# Patient Record
Sex: Male | Born: 1940
Health system: Southern US, Community
[De-identification: ages and names within clinical notes are randomized; demographics above are authoritative.]

## PROBLEM LIST (undated history)

## (undated) DIAGNOSIS — M549 Dorsalgia, unspecified: Secondary | ICD-10-CM

## (undated) DIAGNOSIS — E785 Hyperlipidemia, unspecified: Secondary | ICD-10-CM

## (undated) DIAGNOSIS — I10 Essential (primary) hypertension: Secondary | ICD-10-CM

## (undated) DIAGNOSIS — F09 Unspecified mental disorder due to known physiological condition: Secondary | ICD-10-CM

## (undated) DIAGNOSIS — G44329 Chronic post-traumatic headache, not intractable: Secondary | ICD-10-CM

## (undated) DIAGNOSIS — K219 Gastro-esophageal reflux disease without esophagitis: Secondary | ICD-10-CM

## (undated) DIAGNOSIS — S0990XS Unspecified injury of head, sequela: Secondary | ICD-10-CM

## (undated) DIAGNOSIS — G8929 Other chronic pain: Secondary | ICD-10-CM

## (undated) DIAGNOSIS — N529 Male erectile dysfunction, unspecified: Secondary | ICD-10-CM

## (undated) DIAGNOSIS — K5792 Diverticulitis of intestine, part unspecified, without perforation or abscess without bleeding: Secondary | ICD-10-CM

## (undated) DIAGNOSIS — R739 Hyperglycemia, unspecified: Secondary | ICD-10-CM

## (undated) HISTORY — DX: Diverticulitis of intestine, part unspecified, without perforation or abscess without bleeding: K57.92

## (undated) HISTORY — PX: BACK SURGERY: SHX140

## (undated) HISTORY — DX: Chronic post-traumatic headache, not intractable: G44.329

## (undated) HISTORY — DX: Unspecified injury of head, sequela: S09.90XS

## (undated) HISTORY — DX: Hyperglycemia, unspecified: R73.9

## (undated) HISTORY — DX: Unspecified mental disorder due to known physiological condition: F09

## (undated) HISTORY — DX: Hyperlipidemia, unspecified: E78.5

## (undated) HISTORY — PX: CIRCUMCISION: SUR203

## (undated) HISTORY — DX: Male erectile dysfunction, unspecified: N52.9

## (undated) HISTORY — DX: Essential (primary) hypertension: I10

## (undated) HISTORY — DX: Gastro-esophageal reflux disease without esophagitis: K21.9

## (undated) HISTORY — DX: Other chronic pain: G89.29

## (undated) HISTORY — DX: Dorsalgia, unspecified: M54.9

---

## 1990-12-27 DIAGNOSIS — K5792 Diverticulitis of intestine, part unspecified, without perforation or abscess without bleeding: Secondary | ICD-10-CM

## 1990-12-27 HISTORY — DX: Diverticulitis of intestine, part unspecified, without perforation or abscess without bleeding: K57.92

## 1997-12-27 HISTORY — PX: NASAL SEPTUM SURGERY: SHX37

## 1997-12-27 HISTORY — PX: SHOULDER SURGERY: SHX246

## 1998-07-17 ENCOUNTER — Ambulatory Visit (HOSPITAL_BASED_OUTPATIENT_CLINIC_OR_DEPARTMENT_OTHER): Admission: RE | Admit: 1998-07-17 | Discharge: 1998-07-17 | Payer: Self-pay | Admitting: Orthopedic Surgery

## 2001-03-22 ENCOUNTER — Encounter: Payer: Self-pay | Admitting: Family Medicine

## 2006-05-31 ENCOUNTER — Ambulatory Visit: Payer: Self-pay | Admitting: Family Medicine

## 2006-09-22 ENCOUNTER — Ambulatory Visit: Payer: Self-pay | Admitting: Internal Medicine

## 2007-07-05 ENCOUNTER — Ambulatory Visit: Payer: Self-pay | Admitting: Internal Medicine

## 2007-07-18 ENCOUNTER — Telehealth (INDEPENDENT_AMBULATORY_CARE_PROVIDER_SITE_OTHER): Payer: Self-pay | Admitting: *Deleted

## 2008-01-31 ENCOUNTER — Encounter: Payer: Self-pay | Admitting: Family Medicine

## 2008-01-31 DIAGNOSIS — H9319 Tinnitus, unspecified ear: Secondary | ICD-10-CM | POA: Insufficient documentation

## 2008-01-31 DIAGNOSIS — E7800 Pure hypercholesterolemia, unspecified: Secondary | ICD-10-CM | POA: Insufficient documentation

## 2008-01-31 DIAGNOSIS — F528 Other sexual dysfunction not due to a substance or known physiological condition: Secondary | ICD-10-CM | POA: Insufficient documentation

## 2008-01-31 DIAGNOSIS — K449 Diaphragmatic hernia without obstruction or gangrene: Secondary | ICD-10-CM | POA: Insufficient documentation

## 2008-01-31 DIAGNOSIS — S0990XA Unspecified injury of head, initial encounter: Secondary | ICD-10-CM | POA: Insufficient documentation

## 2008-02-01 ENCOUNTER — Encounter: Admission: RE | Admit: 2008-02-01 | Discharge: 2008-02-01 | Payer: Self-pay | Admitting: Family Medicine

## 2008-02-01 ENCOUNTER — Ambulatory Visit: Payer: Self-pay | Admitting: Family Medicine

## 2008-02-01 DIAGNOSIS — I10 Essential (primary) hypertension: Secondary | ICD-10-CM | POA: Insufficient documentation

## 2008-02-01 DIAGNOSIS — M549 Dorsalgia, unspecified: Secondary | ICD-10-CM

## 2008-02-01 DIAGNOSIS — G8929 Other chronic pain: Secondary | ICD-10-CM

## 2008-02-05 LAB — CONVERTED CEMR LAB
ALT: 24 units/L (ref 0–53)
AST: 25 units/L (ref 0–37)
Albumin: 4.1 g/dL (ref 3.5–5.2)
BUN: 12 mg/dL (ref 6–23)
Basophils Absolute: 0 10*3/uL (ref 0.0–0.1)
Calcium: 9.8 mg/dL (ref 8.4–10.5)
Eosinophils Absolute: 0.1 10*3/uL (ref 0.0–0.6)
Eosinophils Relative: 1.3 % (ref 0.0–5.0)
GFR calc Af Amer: 86 mL/min
HDL: 23.9 mg/dL — ABNORMAL LOW (ref >39.0)
MCHC: 32.3 g/dL (ref 30.0–36.0)
MCV: 89.2 fL (ref 78.0–100.0)
Platelets: 291 10*3/uL (ref 150–400)
Potassium: 4.3 meq/L (ref 3.5–5.1)
RBC: 4.95 M/uL (ref 4.22–5.81)
Triglycerides: 221 mg/dL (ref 0–149)
WBC: 9.8 10*3/uL (ref 4.5–10.5)

## 2008-02-29 ENCOUNTER — Ambulatory Visit: Payer: Self-pay | Admitting: Family Medicine

## 2008-05-03 ENCOUNTER — Telehealth: Payer: Self-pay | Admitting: Family Medicine

## 2008-06-18 ENCOUNTER — Telehealth: Payer: Self-pay | Admitting: Family Medicine

## 2008-07-11 ENCOUNTER — Ambulatory Visit: Payer: Self-pay | Admitting: Family Medicine

## 2008-07-18 ENCOUNTER — Ambulatory Visit: Payer: Self-pay | Admitting: Family Medicine

## 2008-07-19 LAB — CONVERTED CEMR LAB
Albumin: 3.9 g/dL (ref 3.5–5.2)
Alkaline Phosphatase: 88 units/L (ref 39–117)
BUN: 14 mg/dL (ref 6–23)
Bilirubin, Direct: 0.1 mg/dL (ref 0.0–0.3)
CO2: 31 meq/L (ref 19–32)
Chloride: 104 meq/L (ref 96–112)
Creatinine, Ser: 1.3 mg/dL (ref 0.4–1.5)
GFR calc non Af Amer: 59 mL/min
LDL Cholesterol: 113 mg/dL — ABNORMAL HIGH (ref 0–99)
Potassium: 3.5 meq/L (ref 3.5–5.1)
Total CHOL/HDL Ratio: 7.2

## 2008-08-01 ENCOUNTER — Telehealth (INDEPENDENT_AMBULATORY_CARE_PROVIDER_SITE_OTHER): Payer: Self-pay | Admitting: *Deleted

## 2008-08-28 ENCOUNTER — Telehealth: Payer: Self-pay | Admitting: Family Medicine

## 2008-10-08 ENCOUNTER — Ambulatory Visit: Payer: Self-pay | Admitting: Family Medicine

## 2008-10-08 DIAGNOSIS — E119 Type 2 diabetes mellitus without complications: Secondary | ICD-10-CM | POA: Insufficient documentation

## 2008-10-10 LAB — CONVERTED CEMR LAB
Hgb A1c MFr Bld: 6.5 % — ABNORMAL HIGH (ref 4.6–6.0)
PSA: 0.79 ng/mL (ref 0.10–4.00)

## 2008-10-23 ENCOUNTER — Ambulatory Visit: Payer: Self-pay | Admitting: Family Medicine

## 2008-10-24 LAB — CONVERTED CEMR LAB: OCCULT 1: NEGATIVE

## 2009-01-17 ENCOUNTER — Ambulatory Visit: Payer: Self-pay | Admitting: Family Medicine

## 2009-01-20 LAB — CONVERTED CEMR LAB: Hgb A1c MFr Bld: 6.5 % — ABNORMAL HIGH (ref 4.6–6.0)

## 2009-01-22 ENCOUNTER — Ambulatory Visit: Payer: Self-pay | Admitting: Family Medicine

## 2009-01-29 ENCOUNTER — Telehealth: Payer: Self-pay | Admitting: Family Medicine

## 2009-03-10 ENCOUNTER — Ambulatory Visit: Payer: Self-pay | Admitting: Family Medicine

## 2009-05-13 ENCOUNTER — Ambulatory Visit: Payer: Self-pay | Admitting: Family Medicine

## 2009-05-15 LAB — CONVERTED CEMR LAB
ALT: 11 units/L (ref 0–53)
AST: 19 units/L (ref 0–37)
Albumin: 3.6 g/dL (ref 3.5–5.2)
BUN: 12 mg/dL (ref 6–23)
Chloride: 110 meq/L (ref 96–112)
Creatinine, Ser: 1 mg/dL (ref 0.4–1.5)
Glucose, Bld: 98 mg/dL (ref 70–99)
Hgb A1c MFr Bld: 6.3 % (ref 4.6–6.5)
Phosphorus: 2.8 mg/dL (ref 2.3–4.6)
Total CHOL/HDL Ratio: 6
VLDL: 25.6 mg/dL (ref 0.0–40.0)

## 2009-05-19 ENCOUNTER — Telehealth: Payer: Self-pay | Admitting: Family Medicine

## 2009-07-04 ENCOUNTER — Telehealth: Payer: Self-pay | Admitting: Family Medicine

## 2009-08-11 ENCOUNTER — Ambulatory Visit: Payer: Self-pay | Admitting: Family Medicine

## 2009-08-12 LAB — CONVERTED CEMR LAB
Albumin: 4 g/dL (ref 3.5–5.2)
BUN: 15 mg/dL (ref 6–23)
Chloride: 105 meq/L (ref 96–112)
Creatinine, Ser: 1.1 mg/dL (ref 0.4–1.5)
Glucose, Bld: 112 mg/dL — ABNORMAL HIGH (ref 70–99)
Hgb A1c MFr Bld: 6.1 % (ref 4.6–6.5)
Phosphorus: 4 mg/dL (ref 2.3–4.6)

## 2009-11-10 ENCOUNTER — Ambulatory Visit: Payer: Self-pay | Admitting: Family Medicine

## 2009-11-11 ENCOUNTER — Encounter: Payer: Self-pay | Admitting: Family Medicine

## 2009-11-11 LAB — CONVERTED CEMR LAB
ALT: 15 units/L (ref 0–53)
AST: 16 units/L (ref 0–37)
Chloride: 105 meq/L (ref 96–112)
GFR calc non Af Amer: 70.79 mL/min (ref >60)
Glucose, Bld: 98 mg/dL (ref 70–99)
Phosphorus: 3.4 mg/dL (ref 2.3–4.6)
Potassium: 3.9 meq/L (ref 3.5–5.1)
Sodium: 142 meq/L (ref 135–145)
Total CHOL/HDL Ratio: 6

## 2009-11-18 ENCOUNTER — Ambulatory Visit: Payer: Self-pay | Admitting: Family Medicine

## 2009-11-19 ENCOUNTER — Telehealth: Payer: Self-pay | Admitting: Family Medicine

## 2009-11-19 ENCOUNTER — Encounter: Payer: Self-pay | Admitting: Family Medicine

## 2009-12-08 ENCOUNTER — Telehealth: Payer: Self-pay | Admitting: Family Medicine

## 2009-12-09 LAB — HM DIABETES EYE EXAM: HM Diabetic Eye Exam: NORMAL

## 2010-02-09 ENCOUNTER — Ambulatory Visit: Payer: Self-pay | Admitting: Family Medicine

## 2010-02-13 LAB — CONVERTED CEMR LAB
ALT: 18 units/L (ref 0–53)
BUN: 15 mg/dL (ref 6–23)
CO2: 32 meq/L (ref 19–32)
Calcium: 9.8 mg/dL (ref 8.4–10.5)
Chloride: 108 meq/L (ref 96–112)
Cholesterol: 152 mg/dL (ref 0–200)
Creatinine, Ser: 1.2 mg/dL (ref 0.4–1.5)
Direct LDL: 93.1 mg/dL
GFR calc non Af Amer: 63.98 mL/min (ref >60)
Hgb A1c MFr Bld: 6.3 % (ref 4.6–6.5)
Sodium: 144 meq/L (ref 135–145)
Total CHOL/HDL Ratio: 5
Triglycerides: 326 mg/dL — ABNORMAL HIGH (ref 0.0–149.0)

## 2010-03-03 ENCOUNTER — Telehealth: Payer: Self-pay | Admitting: Family Medicine

## 2010-06-01 ENCOUNTER — Telehealth: Payer: Self-pay | Admitting: Internal Medicine

## 2010-07-01 ENCOUNTER — Telehealth: Payer: Self-pay | Admitting: Family Medicine

## 2010-08-10 ENCOUNTER — Ambulatory Visit: Payer: Self-pay | Admitting: Family Medicine

## 2010-08-10 ENCOUNTER — Encounter (INDEPENDENT_AMBULATORY_CARE_PROVIDER_SITE_OTHER): Payer: Self-pay | Admitting: *Deleted

## 2010-08-13 LAB — CONVERTED CEMR LAB
AST: 20 units/L (ref 0–37)
Albumin: 4.1 g/dL (ref 3.5–5.2)
BUN: 15 mg/dL (ref 6–23)
Calcium: 9.2 mg/dL (ref 8.4–10.5)
Chloride: 102 meq/L (ref 96–112)
Cholesterol: 156 mg/dL (ref 0–200)
Direct LDL: 93.5 mg/dL
Hgb A1c MFr Bld: 6.4 % (ref 4.6–6.5)
Potassium: 4.7 meq/L (ref 3.5–5.1)
VLDL: 75.4 mg/dL — ABNORMAL HIGH (ref 0.0–40.0)

## 2010-09-04 ENCOUNTER — Encounter (INDEPENDENT_AMBULATORY_CARE_PROVIDER_SITE_OTHER): Payer: Self-pay | Admitting: *Deleted

## 2010-09-07 ENCOUNTER — Ambulatory Visit: Payer: Self-pay | Admitting: Gastroenterology

## 2010-09-21 ENCOUNTER — Ambulatory Visit: Payer: Self-pay | Admitting: Gastroenterology

## 2010-09-21 LAB — HM COLONOSCOPY

## 2010-10-08 ENCOUNTER — Telehealth: Payer: Self-pay | Admitting: Family Medicine

## 2010-10-26 ENCOUNTER — Telehealth: Payer: Self-pay | Admitting: Family Medicine

## 2010-12-22 ENCOUNTER — Telehealth: Payer: Self-pay | Admitting: Family Medicine

## 2011-01-26 NOTE — Assessment & Plan Note (Signed)
Summary: 6 MONTH FOLLOW UP/RBH   Vital Signs:  Patient profile:   70 year old male Height:      72.25 inches Weight:      178.75 pounds BMI:     24.16 Temp:     98.2 degrees F oral Pulse rate:   80 / minute Pulse rhythm:   regular BP sitting:   116 / 70  (left arm) Cuff size:   regular  Vitals Entered By: Lewanda Rife LPN (February 09, 2010 3:24 PM)  History of Present Illness: here to follow up with HTN and lipids and DM  is doing well with nothing new  wt is stable- is controlling thiswell (back feels so much better  he thought he had lost some wt - feels thinner   had teeth pulled and implants put in - very happy with that    bp today 116.70- has been in good control with no problems    Dm- last AIC 6.2 with diet control doing well with his low sugar diet  exercise -- is walking 2 miles per day / working less on cars than he used to   got his flu shot  is up to date pneumovax   Allergies: 1)  ! * Aleve 2)  ! Asa 3)  Codeine Sulfate (Codeine Sulfate)  Past History:  Past Medical History: Last updated: 01/22/2009 past hx of head trauma- with permanent cognitive deficits chronic headache from above  HTN chronic back pain with past sx ED hyperlipidemia chronic tinnitus  GERD with HH  episode of memory loss in 09-- was ref to neurol/ pt cancelled due to resolution of symptoms DM 2 - mild   Past Surgical History: Last updated: 02/01/2008 Back surgery x 6-including fusion Diverticulitis (1992) Shoulder surgery (1999) Sinus surgery- deviated septum (1999) Circumcision  Family History: Last updated: 2008/10/31 Father: died in his 1's of unknown causes Mother: died in her 86's Siblings: 33- all older, 1 brother and 1 sister died of unknown causes no DM in family  no cancer in family   Social History: Last updated: 2008/10/31 Marital Status: Married Children: 1 daughter 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 CV:  Denies chest pain or discomfort, lightheadness, palpitations, and shortness of breath with exertion. Resp:  Denies cough and wheezing. GI:  Denies abdominal pain, bloody stools, change in bowel habits, and indigestion. MS:  Complains of joint pain, low back pain, and mid back pain; denies muscle aches and cramps. Derm:  Denies lesion(s), poor wound healing, and rash. Neuro:  Complains of memory loss; denies numbness and tingling. Psych:  Denies anxiety and depression. Endo:  Denies cold intolerance, excessive thirst, excessive urination, and heat intolerance.  Physical Exam  General:  Well-developed,well-nourished,in no acute distress; alert,appropriate and cooperative throughout examination Head:  normocephalic, atraumatic, and no abnormalities observed.   Eyes:  vision grossly intact, pupils equal, pupils round, and pupils reactive to light.  no conjunctival pallor, injection or icterus  Mouth:  pharynx pink and moist.   Neck:  supple with full rom and no masses or thyromegally, no JVD or carotid bruit  Chest Wall:  No deformities, masses, tenderness or gynecomastia noted. Lungs:  Normal respiratory effort, chest expands symmetrically. Lungs are clear to auscultation, no crackles or wheezes. Heart:  Normal rate and regular rhythm. S1  and S2 normal without gallop, murmur, click, rub or other extra sounds. Abdomen:  Bowel sounds positive,abdomen soft and non-tender without masses, organomegaly or hernias noted. no renal bruits  Msk:  No deformity or scoliosis noted of thoracic or lumbar spine.  no acute joint changes poor rom spine Pulses:  R and L carotid,radial,femoral,dorsalis pedis and posterior tibial pulses are full and equal bilaterally Extremities:  No clubbing, cyanosis, edema, or deformity noted with normal full range of motion of all  joints.   Neurologic:  sensation intact to light touch, gait normal, and DTRs symmetrical and normal.   Skin:  Intact without suspicious lesions or rashes Cervical Nodes:  No lymphadenopathy noted Inguinal Nodes:  No significant adenopathy Psych:  cheerful and mentally sharp today  Diabetes Management Exam:    Foot Exam (with socks and/or shoes not present):       Sensory-Pinprick/Light touch:          Left medial foot (L-4): normal          Left dorsal foot (L-5): normal          Left lateral foot (S-1): normal          Right medial foot (L-4): normal          Right dorsal foot (L-5): normal          Right lateral foot (S-1): normal       Sensory-Monofilament:          Left foot: normal          Right foot: normal       Inspection:          Left foot: normal          Right foot: normal       Nails:          Left foot: normal          Right foot: thickened    Eye Exam:       Eye Exam done elsewhere          Date: 12/09/2009          Results: normal          Done by: Dr Nelle Don    Impression & Recommendations:  Problem # 1:  ESSENTIAL HYPERTENSION (ICD-401.9) Assessment Unchanged  good control with low dose lisinopril lab today f/u 6 mo  His updated medication list for this problem includes:    Lisinopril 5 Mg Tabs (Lisinopril) .Marland Kitchen... 1 by mouth once daily in am  Orders: Venipuncture (11914) TLB-Lipid Panel (80061-LIPID) TLB-Renal Function Panel (80069-RENAL) TLB-ALT (SGPT) (84460-ALT) TLB-AST (SGOT) (84450-SGOT) TLB-A1C / Hgb A1C (Glycohemoglobin) (83036-A1C)  BP today: 116/70 Prior BP: 120/60 (11/18/2009)  Labs Reviewed: K+: 3.9 (11/10/2009) Creat: : 1.1 (11/10/2009)   Chol: 153 (11/10/2009)   HDL: 25.60 (11/10/2009)   LDL: 104 (11/10/2009)   TG: 115.0 (11/10/2009)  Problem # 2:  DIABETES MELLITUS, MILD (ICD-250.00) Assessment: Unchanged  doing well with diet control  commended on good diet and exercise and wt control  no asa due to allergy  opthy up  to date lab today f/u 6 mo  His updated medication list for this problem includes:    Lisinopril 5 Mg Tabs (Lisinopril) .Marland Kitchen... 1 by mouth once daily in am  Orders: Venipuncture (78295) TLB-Lipid Panel (80061-LIPID) TLB-Renal Function Panel (80069-RENAL) TLB-ALT (SGPT) (84460-ALT) TLB-AST (SGOT) (84450-SGOT) TLB-A1C / Hgb A1C (Glycohemoglobin) (83036-A1C)  Labs Reviewed: Creat: 1.1 (11/10/2009)     Last Eye Exam: normal (  12/09/2009) Reviewed HgBA1c results: 6.2 (11/10/2009)  6.1 (08/11/2009)  Problem # 3:  HYPERCHOLESTEROLEMIA (ICD-272.0) Assessment: Unchanged goal LDL is 70 or below- not quite there yet  rev low sat fat diet- exp improvement today may need to consider statin f/u  91mo  Orders: Venipuncture (78938) TLB-Lipid Panel (80061-LIPID) TLB-Renal Function Panel (80069-RENAL) TLB-ALT (SGPT) (84460-ALT) TLB-AST (SGOT) (84450-SGOT) TLB-A1C / Hgb A1C (Glycohemoglobin) (83036-A1C) Prescription Created Electronically [phone])  Labs Reviewed: SGOT: 16 (11/10/2009)   SGPT: 15 (11/10/2009)   HDL:25.60 (11/10/2009), 23.50 (05/13/2009)  LDL:104 (11/10/2009), 102 (05/13/2009)  Chol:153 (11/10/2009), 151 (05/13/2009)  Trig:115.0 (11/10/2009), 128.0 (05/13/2009)  Complete Medication List: 1)  Flexeril 10 Mg Tabs (Cyclobenzaprine hcl) .Marland Kitchen.. 1 by mouth two times a day as needed as needed back pain 2)  Viagra 100 Mg Tabs (Sildenafil citrate) .Marland Kitchen.. 1 by mouth once daily as needed 30 minutes before sexual activity 3)  Lisinopril 5 Mg Tabs (Lisinopril) .Marland Kitchen.. 1 by mouth once daily in am  Patient Instructions: 1)  labs today  2)  keep working on healthy diet and exercise  3)  no change in medicines  4)  I sent your blood pressure px to your pharmacy 5)  schedule fasting labs in 6 months and then follow up  6)  lipid/ast/alt/renal / AIC / microlb 272. 250.0. 401.1 Prescriptions: LISINOPRIL 5 MG TABS (LISINOPRIL) 1 by mouth once daily in am  #90 x 3   Entered and Authorized by:   Judith Part MD   Signed by:   Judith Part MD on 02/09/2010   Method used:   Electronically to        [facility] [phone]* (retail)       [address]       [place], [place]  [address]       Ph: [phone]       Fax: [phone]   RxID:   [id number]   Current Allergies (reviewed today): ! * ALEVE ! ASA CODEINE SULFATE (CODEINE SULFATE)

## 2011-01-26 NOTE — Assessment & Plan Note (Signed)
Summary: FOLLOW UP / LFW   Vital Signs:  Patient profile:   70 year old male Weight:      187.75 pounds BMI:     25.38 Temp:     98.0 degrees F oral Pulse rate:   76 / minute Pulse rhythm:   regular BP sitting:   110 / 72  (left arm) Cuff size:   large  Vitals Entered By: Sydell Axon LPN (August 10, 2010 2:48 PM) CC: 6 Month follow-up   History of Present Illness: here for f/u of HTN and lipids and mild DM  has been feeling pretty good  nothing new had shoulder surgery -- arthritis spur - a big surgery/ worse than he thought this is much better   wt is up 9 lb is eating too much -- (can tell because it worsens his heartburn) activity was lmited for a while    bp great at 110/72 on lisinopril alone  DM mild and diet controlled last AIC 6.3 is watching diet for sugar - more veg this summers   lipids due  Last Lipid ProfileCholesterol: 152 (02/09/2010 3:58:10 PM)HDL:  29.30 (02/09/2010 3:58:10 PM)LDL:  104 (11/10/2009 9:34:04 AM)Triglycerides:  Last Liver profileSGOT:  19 (02/09/2010 3:58:10 PM)SPGT:  18 (02/09/2010 3:58:10 PM)T. Bili:  0.6 (07/18/2008 10:09:00 AM)Alk Phos:  88 (07/18/2008 10:09:00 AM)   is interested in colonoscopy for screening  has had some friends with colon cancer- made him think about     Allergies: 1)  ! * Aleve 2)  ! Asa 3)  Codeine Sulfate (Codeine Sulfate)  Past History:  Past Medical History: Last updated: 01/22/2009 past hx of head trauma- with permanent cognitive deficits chronic headache from above  HTN chronic back pain with past sx ED hyperlipidemia chronic tinnitus  GERD with HH  episode of memory loss in 09-- was ref to neurol/ pt cancelled due to resolution of symptoms DM 2 - mild   Family History: Last updated: Oct 16, 2008 Father: died in his 61's of unknown causes Mother: died in her 46's Siblings: 23- all older, 1 brother and 1 sister died of unknown causes no DM in family  no cancer in family   Social  History: Last updated: 10/16/08 Marital Status: Married Children: 1 daughter 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 Eyes:  Denies blurring and eye irritation. CV:  Denies chest pain or discomfort, lightheadness, palpitations, and shortness of breath with exertion. Resp:  Denies cough, shortness of breath, and wheezing. GI:  Denies abdominal pain, change in bowel habits, and indigestion. GU:  Complains of erectile dysfunction; denies urinary frequency. MS:  Complains of joint pain; denies joint redness, joint swelling, and cramps. Derm:  Denies itching, lesion(s), poor wound healing, and rash. Neuro:  Denies numbness and tingling. Psych:  Denies anxiety and depression. Endo:  Denies cold intolerance, excessive thirst, excessive urination, and heat intolerance. Heme:  Denies abnormal bruising and bleeding.  Physical Exam  General:  Well-developed,well-nourished,in no acute distress; alert,appropriate and cooperative throughout examination (wt gain noted) Head:  normocephalic, atraumatic, and no abnormalities observed.   Eyes:  vision grossly intact, pupils equal, pupils round, and pupils reactive to light.  Mouth:  pharynx pink and moist.   Neck:  supple with full rom and no masses or thyromegally, no JVD or carotid bruit  Chest Wall:  No deformities, masses, tenderness or gynecomastia noted. Lungs:  Normal respiratory effort, chest expands symmetrically. Lungs are clear to auscultation, no crackles or wheezes. Heart:  Normal rate and regular rhythm. S1 and S2 normal without gallop, murmur, click, rub  or other extra sounds. Abdomen:  Bowel sounds positive,abdomen soft and non-tender without masses, organomegaly or hernias noted. no renal bruits  Msk:  better rom R shoulder  Pulses:  R and L carotid,radial,femoral,dorsalis pedis and posterior tibial pulses are full and equal bilaterally Extremities:  No clubbing, cyanosis, edema, or deformity noted with normal full range of motion of all joints.   Neurologic:  sensation intact to light touch, gait normal, and DTRs symmetrical and normal.   Skin:  Intact without suspicious lesions or rashes Cervical Nodes:  No lymphadenopathy noted Psych:  normal affect, talkative and pleasant    Diabetes Management Exam:    Foot Exam (with socks and/or shoes not present):       Sensory-Pinprick/Light touch:          Left medial foot (L-4): normal          Left dorsal foot (L-5): normal          Left lateral foot (S-1): normal          Right medial foot (L-4): normal          Right dorsal foot (L-5): normal          Right lateral foot (S-1): normal       Sensory-Monofilament:          Left foot: normal          Right foot: normal       Inspection:          Left foot: normal          Right foot: normal       Nails:          Left foot: normal          Right foot: thickened   Impression & Recommendations:  Problem # 1:  DIABETES MELLITUS, MILD (ICD-250.00) Assessment Unchanged  check AIC today- suspect higher in light of wt gain  rev low sugar diet and need for exercise as tolerated  disc healthy diet (low simple sugar/ choose complex carbs/ low sat fat) diet and exercise in detail   His updated medication list for this problem includes:    Lisinopril 5 Mg Tabs (Lisinopril) .Marland Kitchen... 1 by mouth once daily in am  Orders: Venipuncture (60454) TLB-Lipid Panel (80061-LIPID) TLB-Renal Function Panel (80069-RENAL) TLB-ALT (SGPT) (84460-ALT) TLB-AST (SGOT) (84450-SGOT) TLB-A1C / Hgb A1C (Glycohemoglobin) (83036-A1C)  Labs Reviewed: Creat: 1.2  (02/09/2010)     Last Eye Exam: normal (12/09/2009) Reviewed HgBA1c results: 6.3 (02/09/2010)  6.2 (11/10/2009)  Problem # 2:  ESSENTIAL HYPERTENSION (ICD-401.9) Assessment: Unchanged  bp is in very good control with lisinopril - will continue that lab today f/u 6 mo  His updated medication list for this problem includes:    Lisinopril 5 Mg Tabs (Lisinopril) .Marland Kitchen... 1 by mouth once daily in am  Orders: Venipuncture (09811) TLB-Lipid Panel (80061-LIPID) TLB-Renal Function Panel (80069-RENAL) TLB-ALT (SGPT) (84460-ALT) TLB-AST (SGOT) (84450-SGOT) TLB-A1C / Hgb A1C (Glycohemoglobin) (83036-A1C)  BP today: 110/72 Prior BP: 116/70 (02/09/2010)  Labs Reviewed: K+: 4.9 (02/09/2010) Creat: : 1.2 (02/09/2010)   Chol: 152 (  02/09/2010)   HDL: 29.30 (02/09/2010)   LDL: 104 (11/10/2009)   TG: 326.0 (02/09/2010)  Problem # 3:  HYPERCHOLESTEROLEMIA (ICD-272.0) Assessment: Unchanged  labs today -- with poss increase due to worse eating rev low sat fat diet in detail  f/u 6 mo  lab today Orders: Venipuncture (04540) TLB-Lipid Panel (80061-LIPID) TLB-Renal Function Panel (80069-RENAL) TLB-ALT (SGPT) (84460-ALT) TLB-AST (SGOT) (84450-SGOT) TLB-A1C / Hgb A1C (Glycohemoglobin) (83036-A1C)  Labs Reviewed: SGOT: 19 (02/09/2010)   SGPT: 18 (02/09/2010)   HDL:29.30 (02/09/2010), 25.60 (11/10/2009)  LDL:104 (11/10/2009), 102 (05/13/2009)  Chol:152 (02/09/2010), 153 (11/10/2009)  Trig:326.0 (02/09/2010), 115.0 (11/10/2009)  Complete Medication List: 1)  Flexeril 10 Mg Tabs (Cyclobenzaprine hcl) .Marland Kitchen.. 1 by mouth two times a day as needed as needed back pain 2)  Viagra 100 Mg Tabs (Sildenafil citrate) .Marland Kitchen.. 1 by mouth once daily as needed 30 minutes before sexual activity 3)  Lisinopril 5 Mg Tabs (Lisinopril) .Marland Kitchen.. 1 by mouth once daily in am  Other Orders: Gastroenterology Referral (GI)  Patient Instructions: 1)  we will do referral for colonoscopy at check out  2)  watch diet - cut back on  eating fats and starches  3)  try to get back to some exercise  4)  labs today 5)  follow up in 6 months   Current Allergies (reviewed today): ! * ALEVE ! ASA CODEINE SULFATE (CODEINE SULFATE)

## 2011-01-26 NOTE — Miscellaneous (Signed)
Summary: [facility] P[facility]isit/prep  Clinical Lists Changes  Medications: Added new medication of MOVIPREP 100 GM  SOLR (PEG-KCL-NACL-NASULF-NA ASC-C) As per prep instructions. - Signed Rx of MOVIPREP 100 GM  SOLR (PEG-KCL-NACL-NASULF-NA ASC-C) As per prep instructions.;  #1 x 0;  Signed;  Entered by: [name] RN;  Authorized by: Mardella Layman MD [facility];  Method used: Electronically to [facility] [phone]*, [address] [place], [place]  [address], Ph: [phone], Fax: [phone] Observations: Added new observation of ALLERGY REV: Done (09/07/2010 14:12)    Prescriptions: MOVIPREP 100 GM  SOLR (PEG-KCL-NACL-NASULF-NA ASC-C) As per prep instructions.  #1 x 0   Entered by:   [name] RN   Authorized by:   Mardella Layman MD [facility]   Signed by:   [name] RN on 09/07/2010   Method used:   Electronically to        [facility] [phone]* (retail)       [address]       [place], [place]  [address]       Ph: [phone]       Fax: [phone]   RxID:   [id number]

## 2011-01-26 NOTE — Letter (Signed)
Summary: Previsit letter  [facility]  [address] [place], [place] [address]   Phone: [phone]  Fax: [phone]       08/10/2010 MRN: [id number]  [facility] [address] [place], [place]  [address]  Dear Mr. MEEGAN,  Welcome to the Gastroenterology Division at [facility].    You are scheduled to see a nurse for your pre-procedure visit on September 07, 2010 at 2:30pm on the 3rd floor at [facility], [address]  We ask that you try to arrive at our office 15 minutes prior to your appointment time to allow for check-in.  Your nurse visit will consist of discussing your medical and surgical history, your immediate family medical history, and your medications.    Please bring a complete list of all your medications or, if you prefer, bring the medication bottles and we will list them.  We will need to be aware of both prescribed and over the counter drugs.  We will need to know exact dosage information as well.  If you are on blood thinners (Coumadin, Plavix, Aggrenox, Ticlid, etc.) please call our office today/prior to your appointment, as we need to consult with your physician about holding your medication.   Please be prepared to read and sign documents such as consent forms, a financial agreement, and acknowledgement forms.  If necessary, and with your consent, a friend or relative is welcome to sit-in on the nurse visit with you.  Please bring your insurance card so that we may make a copy of it.  If your insurance requires a referral to see a specialist, please bring your referral form from your primary care physician.  No co-pay is required for this nurse visit.     If you cannot keep your appointment, please call [phone] to cancel or reschedule prior to your appointment date.  This allows [place] the opportunity to schedule an appointment for another patient in need of care.    Thank you for choosing [facility] for your medical  needs.  We appreciate the opportunity to care for you.  Please visit [place] at our website  to learn more about our practice.                     Sincerely.                                                                                                                   The Gastroenterology Division

## 2011-01-26 NOTE — Progress Notes (Signed)
Summary: Rx Flexeril  Phone Note Refill Request Call back at [phone] Message from:  [facility] [address] on July 01, 2010 2:00 PM  Refills Requested: Medication #1:  FLEXERIL 10 MG  TABS 1 by mouth two times a day as needed as needed back pain   Last Refilled: 06/02/2010 Received E-script request please advise.   Method Requested: Electronic Initial call taken by: Linde Gillis CMA [facility] Dull),  July 01, 2010 2:00 PM  Follow-up for Phone Call        px written on EMR for call in  Follow-up by: Judith Part MD,  July 01, 2010 5:16 PM  Additional Follow-up for Phone Call Additional follow up Details #1::        Medication phoned to  [facility] [address] [facility] as instructed. Lewanda Rife LPN  July 02, 4539 5:29 PM     Prescriptions: FLEXERIL 10 MG  TABS (CYCLOBENZAPRINE HCL) 1 by mouth two times a day as needed as needed back pain  #60 x 3   Entered and Authorized by:   Judith Part MD   Signed by:   Lewanda Rife LPN on 98/10/9146   Method used:   Telephoned to ...       [facility] [address] [phone]* (retail)       [address]       [place], [place]  [address]       Ph: [phone]       Fax: [phone]   RxID:   [id number]

## 2011-01-26 NOTE — Progress Notes (Signed)
Summary: Rx Viagra   Phone Note Refill Request Call back at [phone] Message from:  [facility] on October 08, 2010 4:44 PM  Refills Requested: Medication #1:  VIAGRA 100 MG  TABS 1 by mouth once daily as needed 30 minutes before sexual activity   Last Refilled: 07/30/2010  Method Requested: Electronic Initial call taken by: Sydell Axon LPN,  October 08, 2010 4:44 PM    Prescriptions: VIAGRA 100 MG  TABS (SILDENAFIL CITRATE) 1 by mouth once daily as needed 30 minutes before sexual activity  #27 x 0   Entered and Authorized by:   Shaune Leeks MD   Signed by:   Shaune Leeks MD on 10/08/2010   Method used:   Electronically to        [facility] [phone]* (retail)       [address]       [place], [place]  [address]       Ph: [phone]       Fax: [phone]   RxID:   [id number]

## 2011-01-26 NOTE — Progress Notes (Signed)
Summary: Flexeril  Phone Note Refill Request Message from:  [employer] on June 01, 2010 3:56 PM  Refills Requested: Medication #1:  FLEXERIL 10 MG  TABS 1 by mouth two times a day as needed as needed back pain [facility] [phone]*   Last Lenox Ahr Date:  05/04/2010   Pharmacy Phone:  [phone]   Method Requested: Telephone to Pharmacy Initial call taken by: Delilah Shan CMA [facility] Dull),  June 01, 2010 3:56 PM  Follow-up for Phone Call        okay to send #60 x 0 Follow-up by: Cindee Salt MD,  June 01, 2010 5:54 PM  Additional Follow-up for Phone Call Additional follow up Details #1::        Rx faxed to pharmacy Additional Follow-up by: DeShannon Smith CMA [facility] Dull),  June 02, 2010 8:48 AM    Prescriptions: FLEXERIL 10 MG  TABS (CYCLOBENZAPRINE HCL) 1 by mouth two times a day as needed as needed back pain  #60 x 0   Entered by:   Mervin Hack CMA (AAMA)   Authorized by:   Cindee Salt MD   Signed by:   Mervin Hack CMA (AAMA) on 06/02/2010   Method used:   Electronically to        [facility] [phone]* (retail)       [address]       [place], [place]  [address]       Ph: [phone]       Fax: [phone]   RxID:   [id number]

## 2011-01-26 NOTE — Procedures (Signed)
Summary: Colonoscopy  Patient: Robert Fitzgerald Note: All result statuses are Final unless otherwise noted.  Tests: (1) Colonoscopy (COL)   COL Colonoscopy           DONE     [facility]     [address]     [place], [place]  [address]           COLONOSCOPY PROCEDURE REPORT           PATIENT:  Robert Fitzgerald, Robert Fitzgerald  MR#:  [id number]     BIRTHDATE:  Jan 02, 1941, 69 yrs. old  GENDER:  male     ENDOSCOPIST:  Vania Rea. Jarold Motto, MD, [facility]     REF. BY:  Marne A. Milinda Antis, M.D.     PROCEDURE DATE:  09/21/2010     PROCEDURE:  Surveillance Colonoscopy     ASA CLASS:  Class II     INDICATIONS:  Routine Risk Screening     MEDICATIONS:   Fentanyl 75 mcg IV, Versed 7 mg IV           DESCRIPTION OF PROCEDURE:   After the risks benefits and     alternatives of the procedure were thoroughly explained, informed     consent was obtained.  Digital rectal exam was performed and     revealed no abnormalities.   The LB [id number] endoscope     was introduced through the anus and advanced to the cecum, which     was identified by both the appendix and ileocecal valve, without     limitations.  The quality of the prep was excellent, using     MoviPrep.  The instrument was then slowly withdrawn as the colon     was fully examined.     <<PROCEDUREIMAGES>>           FINDINGS:  No polyps or cancers were seen.  This was otherwise a     normal examination of the colon.   Retroflexed views in the rectum     revealed no abnormalities.    The scope was then withdrawn from     the patient and the procedure completed.           COMPLICATIONS:  None     ENDOSCOPIC IMPRESSION:     1) No polyps or cancers     2) Otherwise normal examination     RECOMMENDATIONS:     1) Continue current colorectal screening recommendations for     "routine risk" patients with a repeat colonoscopy in 10 years.     REPEAT EXAM:  No           ______________________________     Vania Rea. Jarold Motto, MD, [facility]           CC:          n.     eSIGNED:   Vania Rea. Patterson at 09/21/2010 11:52 AM           Ted Mcalpine, [id number]  Note: An exclamation mark (!) indicates a result that was not dispersed into the flowsheet. Document Creation Date: 09/21/2010 11:52 AM _______________________________________________________________________  (1) Order result status: Final Collection or observation date-time: 09/21/2010 11:46 Requested date-time:  Receipt date-time:  Reported date-time:  Referring Physician:   Ordering Physician: Sheryn Bison [phone]) Specimen Source:  Source: Launa Grill Order Number: [id number] Lab site:   Appended Document: Colonoscopy    Clinical Lists Changes  Observations: Added new observation of COLONNXTDUE: 08/2020 (09/21/2010 13:01)

## 2011-01-26 NOTE — Progress Notes (Signed)
Summary: Cyclobenzaprine 10mg   Phone Note Refill Request Call back at [phone] Message from:  [facility] on October 26, 2010 1:45 PM  Refills Requested: Medication #1:  FLEXERIL 10 MG  TABS 1 by mouth two times a day as needed as needed back pain   Last Refilled: 09/29/2010 [facility] electronically request refill on Cyclobenzaprine 10mg . Last refill date was 09/29/10.Please advise.    Method Requested: Telephone to Pharmacy Initial call taken by: Lewanda Rife LPN,  October 26, 2010 1:46 PM  Follow-up for Phone Call        px written on EMR for call in  Follow-up by: Judith Part MD,  October 26, 2010 3:57 PM  Additional Follow-up for Phone Call Additional follow up Details #1::        Medication phoned to [facility] [address] [facility] as instructed. Lewanda Rife LPN  October 26, 2010 4:38 PM     Prescriptions: FLEXERIL 10 MG  TABS (CYCLOBENZAPRINE HCL) 1 by mouth two times a day as needed as needed back pain  #60 x 3   Entered and Authorized by:   Judith Part MD   Signed by:   Lewanda Rife LPN on 82/95/6213   Method used:   Telephoned to ...       [facility] [address] [phone]* (retail)       [address]       [place], [place]  [address]       Ph: [phone]       Fax: [phone]   RxID:   [id number]

## 2011-01-26 NOTE — Letter (Signed)
Summary: [facility] Instructions  [facility]  [address] [place], [place] [address]   Phone: [phone]  Fax: [phone]       Robert Fitzgerald    09/02/1941    MRN: [id number]        Procedure Day /Date: Monday 09/21/2010     Arrival Time: 10:30AM     Procedure Time: 11:30AM     Location of Procedure:                    _X_  [facility] (4th Floor)                       PREPARATION FOR COLONOSCOPY WITH MOVIPREP   Starting 5 days prior to your procedure 09/16/2010 do not eat nuts, seeds, popcorn, corn, beans, peas,  salads, or any raw vegetables.  Do not take any fiber supplements (e.g. Metamucil, Citrucel, and Benefiber).  THE DAY BEFORE YOUR PROCEDURE         DATE: 09/25  DAY: SUNDAY  1.  Drink clear liquids the entire day-NO SOLID FOOD  2.  Do not drink anything colored red or purple.  Avoid juices with pulp.  No orange juice.  3.  Drink at least 64 oz. (8 glasses) of fluid/clear liquids during the day to prevent dehydration and help the prep work efficiently.  CLEAR LIQUIDS INCLUDE: Water Jello Ice Popsicles Tea (sugar ok, no milk/cream) Powdered fruit flavored drinks Coffee (sugar ok, no milk/cream) Gatorade Juice: apple, white grape, white cranberry  Lemonade Clear bullion, consomm, broth Carbonated beverages (any kind) Strained chicken noodle soup Hard Candy                             4.  In the morning, mix first dose of MoviPrep solution:    Empty 1 Pouch A and 1 Pouch B into the disposable container    Add lukewarm drinking water to the top line of the container. Mix to dissolve    Refrigerate (mixed solution should be used within 24 hrs)  5.  Begin drinking the prep at 5:00 p.m. The MoviPrep container is divided by 4 marks.   Every 15 minutes drink the solution down to the next mark (approximately 8 oz) until the full liter is complete.   6.  Follow completed prep with 16 oz of clear liquid of your choice (Nothing  red or purple).  Continue to drink clear liquids until bedtime.  7.  Before going to bed, mix second dose of MoviPrep solution:    Empty 1 Pouch A and 1 Pouch B into the disposable container    Add lukewarm drinking water to the top line of the container. Mix to dissolve    Refrigerate  THE DAY OF YOUR PROCEDURE      DATE: 09/26 DAY: MONDAY  Beginning at 6:30AM (5 hours before procedure):         1. Every 15 minutes, drink the solution down to the next mark (approx 8 oz) until the full liter is complete.  2. Follow completed prep with 16 oz. of clear liquid of your choice.    3. You may drink clear liquids until 9:30AM (2 HOURS BEFORE PROCEDURE).   MEDICATION INSTRUCTIONS  Unless otherwise instructed, you should take regular prescription medications with a small sip of water   as early as possible the morning of your procedure.  OTHER INSTRUCTIONS  You will need a responsible adult at least 69 years of age to accompany you and drive you home.   This person must remain in the waiting room during your procedure.  Wear loose fitting clothing that is easily removed.  Leave jewelry and other valuables at home.  However, you may wish to bring a book to read or  an iPod/MP3 player to listen to music as you wait for your procedure to start.  Remove all body piercing jewelry and leave at home.  Total time from sign-in until discharge is approximately 2-3 hours.  You should go home directly after your procedure and rest.  You can resume normal activities the  day after your procedure.  The day of your procedure you should not:   Drive   Make legal decisions   Operate machinery   Drink alcohol   Return to work  You will receive specific instructions about eating, activities and medications before you leave.    The above instructions have been reviewed and explained to me by  [name] RN  September 07, 2010 3:00 PM     I fully understand and can  verbalize these instructions _____________________________ Date _________

## 2011-01-26 NOTE — Progress Notes (Signed)
Summary: refill request for viagra  Phone Note Refill Request Message from:  [facility]  Refills Requested: Medication #1:  VIAGRA 100 MG  TABS 1 by mouth once daily as needed 30 minutes before sexual activity   Last Refilled: 01/02/2010 Electronic request from [facility]  Initial call taken by: Lowella Petties CMA,  March 03, 2010 10:51 AM  Follow-up for Phone Call        px written on EMR for call in  Follow-up by: Judith Part MD,  March 03, 2010 11:17 AM  Additional Follow-up for Phone Call Additional follow up Details #1::        Script sent electronically. Additional Follow-up by: Lowella Petties CMA,  March 03, 2010 12:06 PM    New/Updated Medications: VIAGRA 100 MG  TABS (SILDENAFIL CITRATE) 1 by mouth once daily as needed 30 minutes before sexual activity Prescriptions: VIAGRA 100 MG  TABS (SILDENAFIL CITRATE) 1 by mouth once daily as needed 30 minutes before sexual activity  #27 x 3   Entered and Authorized by:   Judith Part MD   Signed by:   Lowella Petties CMA on 03/03/2010   Method used:   Telephoned to ...       [facility] [address] [phone]* (retail)       [address]       [place], [place]  [address]       Ph: [phone]       Fax: [phone]   RxID:   [id number]

## 2011-01-28 NOTE — Progress Notes (Signed)
Summary: Viagra  Phone Note Refill Request Message from:  [facility] on December 22, 2010 8:51 AM  Refills Requested: Medication #1:  VIAGRA 100 MG  TABS 1 by mouth once daily as needed 30 minutes before sexual activity [facility] [phone]*   Last Lenox Ahr Date:  10/08/2010   Pharmacy Phone:  [phone]   Method Requested: Electronic Initial call taken by: Delilah Shan CMA Duncan Dull),  December 22, 2010 8:52 AM  Follow-up for Phone Call        px written on EMR for call in  Follow-up by: Judith Part MD,  December 22, 2010 9:54 AM  Additional Follow-up for Phone Call Additional follow up Details #1::        Medication phoned to [facility] [address] [facility] as instructed. Lewanda Rife LPN  December 22, 2010 2:49 PM     New/Updated Medications: VIAGRA 100 MG  TABS (SILDENAFIL CITRATE) 1 by mouth once daily as needed 30 minutes before sexual activity Prescriptions: VIAGRA 100 MG  TABS (SILDENAFIL CITRATE) 1 by mouth once daily as needed 30 minutes before sexual activity  #27 x 0   Entered and Authorized by:   Judith Part MD   Signed by:   Lewanda Rife LPN on 29/56/2130   Method used:   Telephoned to ...       [facility] [address] [phone]* (retail)       [address]       [place], [place]  [address]       Ph: [phone]       Fax: [phone]   RxID:   [id number]   Appended Document: Viagra Pt's wife called to make sure med was called in. She will call pharmacy to make sure it is ready.

## 2011-02-10 ENCOUNTER — Ambulatory Visit (INDEPENDENT_AMBULATORY_CARE_PROVIDER_SITE_OTHER): Payer: Medicare Other | Admitting: Family Medicine

## 2011-02-10 ENCOUNTER — Other Ambulatory Visit: Payer: Self-pay | Admitting: Family Medicine

## 2011-02-10 ENCOUNTER — Encounter: Payer: Self-pay | Admitting: Family Medicine

## 2011-02-10 DIAGNOSIS — E7800 Pure hypercholesterolemia, unspecified: Secondary | ICD-10-CM

## 2011-02-10 DIAGNOSIS — E785 Hyperlipidemia, unspecified: Secondary | ICD-10-CM

## 2011-02-10 DIAGNOSIS — I10 Essential (primary) hypertension: Secondary | ICD-10-CM

## 2011-02-10 DIAGNOSIS — M549 Dorsalgia, unspecified: Secondary | ICD-10-CM

## 2011-02-10 DIAGNOSIS — F528 Other sexual dysfunction not due to a substance or known physiological condition: Secondary | ICD-10-CM

## 2011-02-10 DIAGNOSIS — E119 Type 2 diabetes mellitus without complications: Secondary | ICD-10-CM

## 2011-02-10 DIAGNOSIS — Z125 Encounter for screening for malignant neoplasm of prostate: Secondary | ICD-10-CM

## 2011-02-10 LAB — HM DIABETES FOOT EXAM: HM Diabetic Foot Exam: NORMAL

## 2011-02-10 LAB — HM DIABETES EYE EXAM: HM Diabetic Eye Exam: NORMAL

## 2011-02-11 LAB — RENAL FUNCTION PANEL
Albumin: 3.9 g/dL (ref 3.5–5.2)
BUN: 14 mg/dL (ref 6–23)
CO2: 28 mEq/L (ref 19–32)
Calcium: 9.8 mg/dL (ref 8.4–10.5)
Creatinine, Ser: 1.2 mg/dL (ref 0.4–1.5)
Glucose, Bld: 104 mg/dL — ABNORMAL HIGH (ref 70–99)

## 2011-02-11 LAB — HEMOGLOBIN A1C: Hgb A1c MFr Bld: 6.3 % (ref 4.6–6.5)

## 2011-02-11 LAB — LIPID PANEL
Cholesterol: 157 mg/dL (ref 0–200)
HDL: 24.4 mg/dL — ABNORMAL LOW (ref >39.00)
Triglycerides: 251 mg/dL — ABNORMAL HIGH (ref 0.0–149.0)

## 2011-02-17 NOTE — Assessment & Plan Note (Signed)
Summary: FOLLOW-UP   Vital Signs:  Patient profile:   70 year old male Height:      72.25 inches Weight:      181.75 pounds BMI:     24.57 Temp:     98.1 degrees F oral Pulse rate:   80 / minute Pulse rhythm:   regular BP sitting:   110 / 76  (left arm) Cuff size:   large  Vitals Entered By: Lewanda Rife LPN (February 10, 2011 3:37 PM) CC: six month f/u   History of Present Illness: here for f/u of HTN and lipids and hyperglycemia  has been feeling good  nothing new medically   wt is down 6 lb with bmi of 24- better feels good at this weight  is staying really active   back has been bad lately 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    had his colonoscopy and that went well - normal    110/76- great HTN control   due for lipid check (hx of low HDL )  due for [facility]- last was 6.4 last eye exam  diet has been healthy in general   is up to date opthy -- was 2 months ago - Dr Nelle Don - and all was ok       Allergies: 1)  ! * Aleve 2)  ! Asa 3)  Codeine Sulfate (Codeine Sulfate)  Past History:  Past Medical History: Last updated: 01/22/2009 past hx of head trauma- with permanent cognitive deficits chronic headache from above  HTN chronic back pain with past sx ED hyperlipidemia chronic tinnitus  GERD with HH  episode of memory loss in 09-- was ref to neurol/ pt cancelled due to resolution of symptoms DM 2 - mild   Past Surgical History: Last updated: 08/10/2010 Back surgery x 6-including fusion Diverticulitis (1992) Shoulder surgery (1999) Sinus surgery- deviated septum (1999) Circumcision R shoulder surgery for bone spur 2011  Family History: Last updated: 01-Nov-2008 Father: died in his 99's of unknown causes Mother: died in her 64's Siblings: 60- all older, 1 brother and 1 sister died of unknown  causes no DM in family  no cancer in family   Social History: Last updated: 11-01-2008 Marital Status: Married Children: 1 daughter Occupation: - retired 2/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 Eyes:  Denies blurring and eye irritation. CV:  Denies chest pain or discomfort, lightheadness, and palpitations. Resp:  Denies cough, shortness of breath, and wheezing. GI:  Denies abdominal pain, change in bowel habits, indigestion, and nausea. GU:  Denies nocturia, urinary frequency, and urinary hesitancy. MS:  Complains of low back pain; denies joint redness and joint swelling. Derm:  Denies itching, lesion(s), poor wound healing, and rash. Neuro:  Complains of numbness; denies headaches, poor balance, and tingling. Psych:  Denies anxiety and depression. Endo:  Denies cold intolerance, excessive thirst, excessive urination, and heat intolerance. Heme:  Denies abnormal bruising and bleeding.  Physical Exam  General:  well appearing , wt loss noted  Head:  normocephalic, atraumatic, and no abnormalities observed.   Eyes:  vision grossly intact, pupils equal, pupils round, and pupils reactive to  light.  no conjunctival pallor, injection or icterus  Ears:  R ear normal and L ear normal.   Mouth:  pharynx pink and moist.   Neck:  supple with full rom and no masses or thyromegally, no JVD or carotid bruit  Chest Wall:  No deformities, masses, tenderness or gynecomastia noted. Lungs:  Normal respiratory effort, chest expands symmetrically. Lungs are clear to auscultation, no crackles or wheezes. Heart:  Normal rate and regular rhythm. S1 and S2 normal without gallop, murmur, click, rub or other extra sounds. Abdomen:  no renal bruits  Msk:  No deformity or scoliosis noted of thoracic or lumbar spine.  poor rom of LS today- moving slow good  rom shoulders Pulses:  R and L carotid,radial,femoral,dorsalis pedis and posterior tibial pulses are full and equal bilaterally Extremities:  No clubbing, cyanosis, edema, or deformity noted with normal full range of motion of all joints.   Neurologic:  sensation intact to light touch, gait normal, and DTRs symmetrical and normal.   Skin:  Intact without suspicious lesions or rashes Cervical Nodes:  No lymphadenopathy noted Inguinal Nodes:  No significant adenopathy Psych:  normal affect, talkative and pleasant -- in good spirits  does repeat himself frequently  Diabetes Management Exam:    Foot Exam (with socks and/or shoes not present):       Sensory-Pinprick/Light touch:          Left medial foot (L-4): normal          Left dorsal foot (L-5): normal          Left lateral foot (S-1): normal          Right medial foot (L-4): normal          Right dorsal foot (L-5): normal          Right lateral foot (S-1): normal       Sensory-Monofilament:          Left foot: normal          Right foot: normal       Inspection:          Left foot: normal          Right foot: normal       Nails:          Left foot: normal          Right foot: normal    Eye Exam:       Eye Exam done elsewhere          Date: 12/09/2010          Results: normal          Done by: Dr Nelle Don    Impression & Recommendations:  Problem # 1:  DIABETES MELLITUS, MILD (ICD-250.00) Assessment Unchanged  labs for hyperglycemia  on ace wt loss and better diet noted  His updated medication list for this problem includes:    Lisinopril 5 Mg Tabs (Lisinopril) .Marland Kitchen... 1 by mouth once daily in am  Orders: Venipuncture (45409) TLB-Lipid Panel (80061-LIPID) TLB-Renal Function Panel (80069-RENAL) TLB-A1C / Hgb A1C (Glycohemoglobin) (83036-A1C) TLB-PSA (Prostate Specific Antigen) (84153-PSA)  Labs Reviewed: Creat: 1.2 (08/10/2010)     Last Eye Exam: normal (12/09/2010) Reviewed HgBA1c results: 6.4 (08/10/2010)   6.3 (02/09/2010)  Problem # 2:  SPECIAL SCREENING MALIGNANT NEOPLASM OF PROSTATE (ICD-V76.44) Assessment: Comment Only  psa today no complaints  Orders: Venipuncture (81191) TLB-Lipid Panel (80061-LIPID) TLB-Renal Function Panel (80069-RENAL) TLB-A1C / Hgb A1C (Glycohemoglobin) (83036-A1C) TLB-PSA (Prostate  Specific Antigen) (84153-PSA)  Problem # 3:  ESSENTIAL HYPERTENSION (ICD-401.9) Assessment: Unchanged  good control lab today no change in med His updated medication list for this problem includes:    Lisinopril 5 Mg Tabs (Lisinopril) .Marland Kitchen... 1 by mouth once daily in am  Orders: Venipuncture ([address]) TLB-Lipid Panel (80061-LIPID) TLB-Renal Function Panel (80069-RENAL) TLB-A1C / Hgb A1C (Glycohemoglobin) (83036-A1C) TLB-PSA (Prostate Specific Antigen) (84153-PSA) Prescription Created Electronically [phone])  BP today: 110/76 Prior BP: 110/72 (08/10/2010)  Labs Reviewed: K+: 4.7 (08/10/2010) Creat: : 1.2 (08/10/2010)   Chol: 156 (08/10/2010)   HDL: 24.40 (08/10/2010)   LDL: 104 (11/10/2009)   TG: 377.0 (08/10/2010)  Problem # 4:  HYPERCHOLESTEROLEMIA (ICD-272.0) Assessment: Unchanged  hope for inc hdl with more exercise  rev low sat fat diet- doing well with that  lab today Orders: Venipuncture (14782) TLB-Lipid Panel (80061-LIPID) TLB-Renal Function Panel (80069-RENAL) TLB-A1C / Hgb A1C (Glycohemoglobin) (83036-A1C) TLB-PSA (Prostate Specific Antigen) (84153-PSA)  Labs Reviewed: SGOT: 20 (08/10/2010)   SGPT: 17 (08/10/2010)   HDL:24.40 (08/10/2010), 29.30 (02/09/2010)  LDL:104 (11/10/2009), 102 (05/13/2009)  Chol:156 (08/10/2010), 152 (02/09/2010)  Trig:377.0 (08/10/2010), 326.0 (02/09/2010)  Complete Medication List: 1)  Flexeril 10 Mg Tabs (Cyclobenzaprine hcl) .Marland Kitchen.. 1 by mouth two times a day as needed as needed back pain 2)  Viagra 100 Mg Tabs (Sildenafil citrate) .Marland Kitchen.. 1 by mouth once daily as needed 30 minutes before sexual activity 3)  Lisinopril 5 Mg  Tabs (Lisinopril) .Marland Kitchen.. 1 by mouth once daily in am  Patient Instructions: 1)  no change in medications  2)  your weight is good - work to maintain that and stay active 3)  labs today 4)  I refilled your medicines  Prescriptions: LISINOPRIL 5 MG TABS (LISINOPRIL) 1 by mouth once daily in am  #30 x 11   Entered and Authorized by:   Judith Part MD   Signed by:   Judith Part MD on 02/10/2011   Method used:   Electronically to        [facility] [phone]* (retail)       [address]       [place], [place]  [address]       Ph: [phone]       Fax: [phone]   RxID:   [id number] VIAGRA 100 MG  TABS (SILDENAFIL CITRATE) 1 by mouth once daily as needed 30 minutes before sexual activity  #27 x 1   Entered and Authorized by:   Judith Part MD   Signed by:   Judith Part MD on 02/10/2011   Method used:   Electronically to        [facility] [phone]* (retail)       [address]       [place], [place]  [address]       Ph: [phone]       Fax: [phone]   RxID:   [id number] FLEXERIL 10 MG  TABS (CYCLOBENZAPRINE HCL) 1 by mouth two times a day as needed as needed back pain  #60 x 3   Entered and Authorized by:   Judith Part MD   Signed by:   Judith Part MD on 02/10/2011   Method used:   Electronically to        [facility] [phone]* (retail)       [address]       [place], [place]  [address]       Ph: [phone]  Fax: [phone]   RxID:   [id number]    Orders Added: 1)  Venipuncture [27253] 2)  TLB-Lipid Panel [80061-LIPID] 3)  TLB-Renal Function Panel [80069-RENAL] 4)  TLB-A1C / Hgb A1C (Glycohemoglobin) [83036-A1C] 5)  TLB-PSA (Prostate Specific Antigen) [66440-HKV] 6)  Prescription Created Electronically [G8553] 7)  Est. Patient Level IV [42595]   Immunization History:  Influenza Immunization History:    Influenza:  fluvax 3+ (09/26/2010)   Immunization History:  Influenza Immunization History:     Influenza:  Fluvax 3+ (09/26/2010)  Current Allergies (reviewed today): ! * ALEVE ! ASA CODEINE SULFATE (CODEINE SULFATE)

## 2011-06-05 ENCOUNTER — Other Ambulatory Visit: Payer: Self-pay | Admitting: Family Medicine

## 2011-06-06 NOTE — Telephone Encounter (Signed)
Px written for call in   

## 2011-06-07 NOTE — Telephone Encounter (Signed)
Rx called to [facility]. 

## 2011-06-21 ENCOUNTER — Other Ambulatory Visit: Payer: Self-pay | Admitting: Family Medicine

## 2011-08-04 ENCOUNTER — Encounter: Payer: Self-pay | Admitting: Family Medicine

## 2011-08-11 ENCOUNTER — Ambulatory Visit (INDEPENDENT_AMBULATORY_CARE_PROVIDER_SITE_OTHER): Payer: Medicare Other | Admitting: Family Medicine

## 2011-08-11 ENCOUNTER — Encounter: Payer: Self-pay | Admitting: Family Medicine

## 2011-08-11 DIAGNOSIS — R7303 Prediabetes: Secondary | ICD-10-CM | POA: Insufficient documentation

## 2011-08-11 DIAGNOSIS — R7309 Other abnormal glucose: Secondary | ICD-10-CM

## 2011-08-11 DIAGNOSIS — R739 Hyperglycemia, unspecified: Secondary | ICD-10-CM

## 2011-08-11 DIAGNOSIS — I10 Essential (primary) hypertension: Secondary | ICD-10-CM

## 2011-08-11 NOTE — Patient Instructions (Signed)
You need a Tdap vaccine -- medicare does not cover it so look into getting it at the health department  If you are interested in shingles vaccine in future - call your insurance company to see how coverage is and call us to schedule  Keep watching your diet and exercising for good health Labs today Follow up in 6 months for a 30 minute annual exam with labs prior

## 2011-08-11 NOTE — Assessment & Plan Note (Signed)
Good control  No changes on ace  Disc need for imms - will get Tdap at health dept and look into coverage for zostavax

## 2011-08-11 NOTE — Assessment & Plan Note (Signed)
Last a1c 6.3 Is watching diet well and getting exercise Wt is stable Good foot care opthy up to date  Check a1c today- expect stable F/u 6 mo  Rev low glycemic diet
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
## 2011-08-12 LAB — HEMOGLOBIN A1C: Hgb A1c MFr Bld: 6.2 % (ref 4.6–6.5)

## 2011-10-19 ENCOUNTER — Ambulatory Visit (INDEPENDENT_AMBULATORY_CARE_PROVIDER_SITE_OTHER): Payer: Medicare Other

## 2011-10-19 DIAGNOSIS — Z23 Encounter for immunization: Secondary | ICD-10-CM

## 2011-11-02 ENCOUNTER — Other Ambulatory Visit: Payer: Self-pay | Admitting: Family Medicine

## 2011-11-02 NOTE — Telephone Encounter (Signed)
Px written for call in   Or is it mail order? Forgot to check- let me know

## 2011-11-03 NOTE — Telephone Encounter (Signed)
Medication phoned to [facility] as instructed.

## 2011-11-03 NOTE — Telephone Encounter (Signed)
Rx called in as directed.   

## 2011-12-01 ENCOUNTER — Other Ambulatory Visit: Payer: Self-pay | Admitting: Family Medicine

## 2011-12-02 NOTE — Telephone Encounter (Signed)
Will refill electronically  

## 2011-12-02 NOTE — Telephone Encounter (Signed)
[facility] [address] request refill Flexeril 10 mg. Last filled 11/02/11 and pt last seen 08/11/11.Please advise.

## 2011-12-03 ENCOUNTER — Other Ambulatory Visit: Payer: Self-pay | Admitting: Internal Medicine

## 2011-12-16 ENCOUNTER — Telehealth: Payer: Self-pay | Admitting: Family Medicine

## 2011-12-16 NOTE — Telephone Encounter (Signed)
When pharmacy was contacted for Lisinopril refill for BP they told them that it did not have refills remaining. Requesting a call be made to pharmacy to clarify and call back pt at [phone]

## 2011-12-16 NOTE — Telephone Encounter (Signed)
Spoke with Ms Winterrowd and they got refills to last until 01/2012. I explained pt had appt to see Dr Milinda Antis 02/09/12 and would refill for one year at that time if that was OK and she said that would be fine.

## 2011-12-17 ENCOUNTER — Telehealth: Payer: Self-pay | Admitting: Internal Medicine

## 2011-12-24 NOTE — Telephone Encounter (Signed)
error 

## 2012-02-09 ENCOUNTER — Ambulatory Visit (INDEPENDENT_AMBULATORY_CARE_PROVIDER_SITE_OTHER): Payer: Medicare Other | Admitting: Family Medicine

## 2012-02-09 ENCOUNTER — Encounter: Payer: Self-pay | Admitting: Family Medicine

## 2012-02-09 VITALS — BP 124/88 | HR 72 | Temp 98.4°F | Wt 183.8 lb

## 2012-02-09 DIAGNOSIS — I10 Essential (primary) hypertension: Secondary | ICD-10-CM | POA: Diagnosis not present

## 2012-02-09 DIAGNOSIS — E7800 Pure hypercholesterolemia, unspecified: Secondary | ICD-10-CM | POA: Diagnosis not present

## 2012-02-09 DIAGNOSIS — H9319 Tinnitus, unspecified ear: Secondary | ICD-10-CM | POA: Diagnosis not present

## 2012-02-09 DIAGNOSIS — R7309 Other abnormal glucose: Secondary | ICD-10-CM

## 2012-02-09 DIAGNOSIS — R739 Hyperglycemia, unspecified: Secondary | ICD-10-CM

## 2012-02-09 MED ORDER — LISINOPRIL 5 MG PO TABS: 5.0000 mg | ORAL_TABLET | ORAL | Status: DC

## 2012-02-09 MED ORDER — SILDENAFIL CITRATE 100 MG PO TABS: ORAL_TABLET | ORAL | Status: DC

## 2012-02-09 NOTE — Assessment & Plan Note (Signed)
bp in fair control at this time  No changes needed  Disc lifstyle change with low sodium diet and exercise  Lab today  F/u 6 mo  

## 2012-02-09 NOTE — Assessment & Plan Note (Signed)
This has been very well controlled with diet a1c today  Rev low glycemic diet No symptoms

## 2012-02-09 NOTE — Patient Instructions (Addendum)
Try to keep up healthy diet and exercise(walking)  Weight is stable  Blood pressure is stable You are due for labs for your cholesterol - (did eat breakfast)  If you are interested in a shingles/zoster vaccine - call your insurance to check on coverage,( you should not get it within 1 month of other vaccines) , then call us for a prescription  for it to take to a pharmacy that gives the shot or get it here  Follow up with me in 6 months

## 2012-02-09 NOTE — Progress Notes (Signed)
Subjective:    Patient ID: Robert Fitzgerald, male    DOB: October 22, 1941, 71 y.o.   MRN: [id number]  HPI Here for f/u of chronic medical problems  Has been doing fairly well overall   He had a flu shot   Has been sick on and off uri  Is finally feeling better - had cough and mild fever  Other than that has been doing ok   Ringing in ears is worse  ? From the virus he had  Hearing is pretty good overall despite this     bp is  124/88   Today No cp or palpitations or headaches or edema  No side effects to medicines   On ace   Wt is up 1 lb with bmi of 24 Diet- is still trying to cut out sugar- doing about the same  Exercise- is doing that daily   Hx of hyperglycemia Lab Results  Component Value Date   HGBA1C 6.2 08/11/2011     Lab Results  Component Value Date   CHOL 157 02/10/2011   CHOL 156 08/10/2010   CHOL 152 02/09/2010   Lab Results  Component Value Date   HDL 24.40* 02/10/2011   HDL 24.40* 08/10/2010   HDL 29.30* 02/09/2010   Lab Results  Component Value Date   LDLCALC 104* 11/10/2009   LDLCALC 102* 05/13/2009   LDLCALC 113* 07/18/2008   Lab Results  Component Value Date   TRIG 251.0* 02/10/2011   TRIG 377.0* 08/10/2010   TRIG 326.0* 02/09/2010   Lab Results  Component Value Date   CHOLHDL 6 02/10/2011   CHOLHDL 6 08/10/2010   CHOLHDL 5 02/09/2010   Lab Results  Component Value Date   LDLDIRECT 106.2 02/10/2011   LDLDIRECT 93.5 08/10/2010   LDLDIRECT 93.1 02/09/2010   trig were imp but still high last time Due for HTN labs today   Still has trouble with his back - getting worse as he gets older  Dr Lajoyce Corners said nothing more could be done   Patient Active Problem List  Diagnoses  . HYPERCHOLESTEROLEMIA  . ERECTILE DYSFUNCTION  . TINNITUS, CHRONIC  . ESSENTIAL HYPERTENSION  . HIATAL HERNIA WITH REFLUX  . BACK PAIN  . MEMORY LOSS  . HEAD TRAUMA, CLOSED  . Hyperglycemia   Past Medical History  Diagnosis Date  . Cognitive deficit due to old head  trauma     permanent  . Headache, post-traumatic, chronic   . Hypertension   . Chronic back pain   . ED (erectile dysfunction)   . Hyperlipidemia   . Tinnitus     chronic  . GERD (gastroesophageal reflux disease)   . Diabetes mellitus     Type 2, mild  . Diverticulitis 1992   Past Surgical History  Procedure Date  . Back surgery     x 6- including fusion  . Shoulder surgery 1999    2011 for bone spur  . Nasal septum surgery 1999  . Circumcision    History  Substance Use Topics  . Smoking status: Never Smoker   . Smokeless tobacco: Not on file  . Alcohol Use: No   No family history on file. Allergies  Allergen Reactions  . Aspirin     REACTION: GI upset  . Codeine Sulfate     REACTION: nausea  . Naproxen Sodium     REACTION: GI upset   Current Outpatient Prescriptions on File Prior to Visit  Medication Sig Dispense Refill  . cyclobenzaprine (FLEXERIL)  10 MG tablet TAKE ONE TABLET BY MOUTH TWICE DAILY AS NEEDED FOR BACK PAIN  60 tablet  5        Review of Systems Review of Systems  Constitutional: Negative for fever, appetite change, fatigue and unexpected weight change.  Eyes: Negative for pain and visual disturbance.  Respiratory: Negative for cough and shortness of breath.   Cardiovascular: Negative for cp or palpitations    Gastrointestinal: Negative for nausea, diarrhea and constipation.  Genitourinary: Negative for urgency and frequency.  Skin: Negative for pallor or rash   Neurological: Negative for weakness, light-headedness, numbness and headaches. pos for tinnitus without hearing loss Hematological: Negative for adenopathy. Does not bruise/bleed easily.  Psychiatric/Behavioral: Negative for dysphoric mood. The patient is not nervous/anxious.  he does confuse very easily since head trauma- but this has not changed         Objective:   Physical Exam  Constitutional: He appears well-developed and well-nourished. No distress.  HENT:  Head:  Normocephalic and atraumatic.  Mouth/Throat: Oropharynx is clear and moist. No oropharyngeal exudate.  Eyes: Conjunctivae and EOM are normal. Pupils are equal, round, and reactive to light. No scleral icterus.  Neck: Normal range of motion. Neck supple. No JVD present. Carotid bruit is not present. No thyromegaly present.  Cardiovascular: Normal rate, regular rhythm, normal heart sounds and intact distal pulses.  Exam reveals no gallop.   Pulmonary/Chest: Effort normal and breath sounds normal. No respiratory distress. He has no wheezes. He exhibits no tenderness.  Abdominal: Soft. Bowel sounds are normal. He exhibits no distension, no abdominal bruit and no mass. There is no tenderness.  Musculoskeletal: He exhibits no edema and no tenderness.       Poor rom LS with prior fusion  Lymphadenopathy:    He has no cervical adenopathy.  Neurological: He is alert. He has normal reflexes. No cranial nerve deficit. He exhibits normal muscle tone. Coordination normal.  Skin: Skin is warm and dry. No rash noted. No erythema.  Psychiatric: He has a normal mood and affect.          Assessment & Plan:
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
## 2012-02-09 NOTE — Assessment & Plan Note (Signed)
Due for lipid panel today- not fasting  Hx of high trig- watching - imp with better sugar  Rev low fat diet - overall does well with this

## 2012-02-10 LAB — CBC WITH DIFFERENTIAL/PLATELET
Basophils Relative: 0.4 % (ref 0.0–3.0)
HCT: 43 % (ref 39.0–52.0)
Hemoglobin: 14.2 g/dL (ref 13.0–17.0)
Lymphocytes Relative: 29.3 % (ref 12.0–46.0)
MCHC: 33.1 g/dL (ref 30.0–36.0)
Monocytes Relative: 7.6 % (ref 3.0–12.0)
Neutro Abs: 6.2 10*3/uL (ref 1.4–7.7)
RBC: 4.71 Mil/uL (ref 4.22–5.81)

## 2012-02-10 LAB — COMPREHENSIVE METABOLIC PANEL
ALT: 14 U/L (ref 0–53)
Alkaline Phosphatase: 80 U/L (ref 39–117)
CO2: 27 mEq/L (ref 19–32)
Creatinine, Ser: 1.3 mg/dL (ref 0.4–1.5)
GFR: 58.52 mL/min — ABNORMAL LOW (ref >60.00)
Total Bilirubin: 0.3 mg/dL (ref 0.3–1.2)

## 2012-02-10 LAB — LDL CHOLESTEROL, DIRECT: Direct LDL: 119.1 mg/dL

## 2012-02-10 LAB — LIPID PANEL
HDL: 30.3 mg/dL — ABNORMAL LOW (ref >39.00)
Total CHOL/HDL Ratio: 6
Triglycerides: 329 mg/dL — ABNORMAL HIGH (ref 0.0–149.0)

## 2012-05-20 ENCOUNTER — Other Ambulatory Visit: Payer: Self-pay | Admitting: Family Medicine

## 2012-05-23 NOTE — Telephone Encounter (Signed)
Will refill electronically  

## 2012-08-08 ENCOUNTER — Ambulatory Visit (INDEPENDENT_AMBULATORY_CARE_PROVIDER_SITE_OTHER): Payer: Medicare Other | Admitting: Family Medicine

## 2012-08-08 ENCOUNTER — Encounter: Payer: Self-pay | Admitting: Family Medicine

## 2012-08-08 VITALS — BP 125/80 | HR 84 | Temp 98.1°F | Ht 75.0 in | Wt 187.2 lb

## 2012-08-08 DIAGNOSIS — R7309 Other abnormal glucose: Secondary | ICD-10-CM | POA: Diagnosis not present

## 2012-08-08 DIAGNOSIS — R739 Hyperglycemia, unspecified: Secondary | ICD-10-CM

## 2012-08-08 DIAGNOSIS — I10 Essential (primary) hypertension: Secondary | ICD-10-CM | POA: Diagnosis not present

## 2012-08-08 NOTE — Assessment & Plan Note (Signed)
Suspect a1c may be up today due to more simple sugar consumption Disc low glycemic diet Pt is active Will adv further  F/u 6 mo

## 2012-08-08 NOTE — Patient Instructions (Addendum)
Lay off the sweets and sugars Stay active and watch out for weight gain  Make sure you drink enough water with the heat outside  Follow up in 6 months for annual exam with labs prior Lab today for sugar

## 2012-08-08 NOTE — Progress Notes (Signed)
Subjective:    Patient ID: Robert Fitzgerald, male    DOB: 12/26/1941, 71 y.o.   MRN: [id number]  HPI Here for f/u of chronic conditions  Is doing well  Nothing new going   Wt is up 4 lb with bmi of 23- pt states less by his scales  Diet- has been eating good - healthy diet / but does not always stay away from the sugar -- eating little debbie banana rolls  Will quit the sweets  Exercise- has a mile marked off around the house -walks 1-3 per day and also lot of yard work   bp is up a bit      Today BP Readings from Last 3 Encounters:  08/08/12 143/87  02/09/12 124/88  08/11/11 124/76   better on 2nd check today, however No cp or palpitations or headaches or edema  No side effects to medicines   On ace alone- has worked well for him  Hyperglycemia  Last a1c was 6.5- borderline  Worried this may be up - eating more sweets (but also getting more exercise)  Lab Results  Component Value Date   CHOL 190 02/09/2012   HDL 30.30* 02/09/2012   LDLCALC 104* 11/10/2009   LDLDIRECT 119.1 02/09/2012   TRIG 329.0* 02/09/2012   CHOLHDL 6 02/09/2012   disc low sat fat diet  Patient Active Problem List  Diagnosis  . HYPERCHOLESTEROLEMIA  . ERECTILE DYSFUNCTION  . TINNITUS, CHRONIC  . ESSENTIAL HYPERTENSION  . HIATAL HERNIA WITH REFLUX  . BACK PAIN  . MEMORY LOSS  . HEAD TRAUMA, CLOSED  . Hyperglycemia   Past Medical History  Diagnosis Date  . 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 Headache, post-traumatic, chronic   . Hypertension   . Chronic back pain   . ED (erectile dysfunction)   . Hyperlipidemia   . Tinnitus     chronic  . GERD (gastroesophageal reflux disease)   . Diabetes mellitus     Type 2, mild  . Diverticulitis 1992   Past Surgical History  Procedure Date  . Back surgery     x 6- including fusion  . Shoulder surgery 1999    2011 for bone spur  . Nasal septum surgery 1999  . Circumcision    History  Substance Use Topics  . Smoking  status: Never Smoker   . Smokeless tobacco: Not on file  . Alcohol Use: No   No family history on file. Allergies  Allergen Reactions  . Aspirin     REACTION: GI upset  . Codeine Sulfate     REACTION: nausea  . Naproxen Sodium     REACTION: GI upset   Current Outpatient Prescriptions on File Prior to Visit  Medication Sig Dispense Refill  . cyclobenzaprine (FLEXERIL) 10 MG tablet TAKE ONE TABLET BY MOUTH TWICE DAILY AS NEEDED BACK  PAIN  60 tablet  5  . lisinopril (PRINIVIL,ZESTRIL) 5 MG tablet Take 1 tablet (5 mg total) by mouth every morning.  90 tablet  3  . sildenafil (VIAGRA) 100 MG tablet Take one tablet by mouth approx 30 minutes before sexual activity as needed  27 tablet  3      Review of Systems Review of Systems  Constitutional: Negative for fever, appetite change, fatigue and unexpected weight change.  Eyes: Negative for pain and visual disturbance.  Respiratory: Negative for cough and shortness of breath.   Cardiovascular: Negative for cp or palpitations  Gastrointestinal: Negative for nausea, diarrhea and constipation.  Genitourinary: Negative for urgency and frequency.  Skin: Negative for pallor or rash   Neurological: Negative for weakness, light-headedness, numbness and headaches.  Hematological: Negative for adenopathy. Does not bruise/bleed easily.  Psychiatric/Behavioral: Negative for dysphoric mood. The patient is not nervous/anxious.         Objective:   Physical Exam  Constitutional: He appears well-developed and well-nourished. No distress.  HENT:  Head: Normocephalic and atraumatic.  Mouth/Throat: Oropharynx is clear and moist.  Eyes: Conjunctivae and EOM are normal. Pupils are equal, round, and reactive to light. No scleral icterus.  Neck: Normal range of motion. Neck supple. No JVD present. Carotid bruit is not present. No thyromegaly present.  Cardiovascular: Normal rate, regular rhythm, normal heart sounds and intact distal pulses.  Exam  reveals no gallop.   Pulmonary/Chest: Effort normal and breath sounds normal. No respiratory distress. He has no wheezes.  Abdominal: Soft. Bowel sounds are normal. He exhibits no distension and no abdominal bruit. There is no tenderness.  Musculoskeletal: He exhibits no edema.  Lymphadenopathy:    He has no cervical adenopathy.  Neurological: He is alert. He has normal reflexes. No cranial nerve deficit. He exhibits normal muscle tone. Coordination normal.  Skin: Skin is warm and dry. No rash noted.  Psychiatric: He has a normal mood and affect.       Pleasant but confuses easily (baseline due to prior head injury)          Assessment & Plan:

## 2012-08-08 NOTE — Assessment & Plan Note (Signed)
bp in fair control at this time  No changes needed  Disc lifstyle change with low sodium diet and exercise   Better on 2nd check Disc lifestyle change and sodium intake F/u 6 mo

## 2012-09-21 ENCOUNTER — Other Ambulatory Visit: Payer: Self-pay | Admitting: Family Medicine

## 2012-11-13 DIAGNOSIS — Z23 Encounter for immunization: Secondary | ICD-10-CM | POA: Diagnosis not present

## 2012-11-16 ENCOUNTER — Other Ambulatory Visit: Payer: Self-pay | Admitting: Family Medicine

## 2012-11-16 NOTE — Telephone Encounter (Signed)
He can have 6 mo of refils , thanks  

## 2012-11-16 NOTE — Telephone Encounter (Signed)
Ok to refill 

## 2013-02-05 ENCOUNTER — Telehealth: Payer: Self-pay | Admitting: Family Medicine

## 2013-02-05 DIAGNOSIS — E7800 Pure hypercholesterolemia, unspecified: Secondary | ICD-10-CM

## 2013-02-05 DIAGNOSIS — I10 Essential (primary) hypertension: Secondary | ICD-10-CM

## 2013-02-05 DIAGNOSIS — R739 Hyperglycemia, unspecified: Secondary | ICD-10-CM

## 2013-02-05 NOTE — Telephone Encounter (Signed)
Message copied by [name] on Mon Feb 05, 2013 10:50 PM ------      Message from: Alvina Chou      Created: Mon Jan 29, 2013  4:18 PM      Regarding: Lab orders for Tuesday, 2.11.14       Patient is scheduled for CPX labs, please order future labs, Thanks , Terri       ------

## 2013-02-06 ENCOUNTER — Other Ambulatory Visit (INDEPENDENT_AMBULATORY_CARE_PROVIDER_SITE_OTHER): Payer: Medicare Other

## 2013-02-06 DIAGNOSIS — I10 Essential (primary) hypertension: Secondary | ICD-10-CM | POA: Diagnosis not present

## 2013-02-06 DIAGNOSIS — R7309 Other abnormal glucose: Secondary | ICD-10-CM

## 2013-02-06 DIAGNOSIS — R739 Hyperglycemia, unspecified: Secondary | ICD-10-CM

## 2013-02-06 DIAGNOSIS — E7800 Pure hypercholesterolemia, unspecified: Secondary | ICD-10-CM | POA: Diagnosis not present

## 2013-02-06 LAB — LIPID PANEL
HDL: 24.7 mg/dL — ABNORMAL LOW (ref >39.00)
Total CHOL/HDL Ratio: 7

## 2013-02-06 LAB — CBC WITH DIFFERENTIAL/PLATELET
Basophils Absolute: 0.1 10*3/uL (ref 0.0–0.1)
Lymphocytes Relative: 40.8 % (ref 12.0–46.0)
Monocytes Relative: 11.5 % (ref 3.0–12.0)
Platelets: 217 10*3/uL (ref 150.0–400.0)
RDW: 14.3 % (ref 11.5–14.6)
WBC: 9.1 10*3/uL (ref 4.5–10.5)

## 2013-02-06 LAB — COMPREHENSIVE METABOLIC PANEL
ALT: 13 U/L (ref 0–53)
Albumin: 4 g/dL (ref 3.5–5.2)
CO2: 28 mEq/L (ref 19–32)
Calcium: 9.2 mg/dL (ref 8.4–10.5)
Chloride: 105 mEq/L (ref 96–112)
GFR: 52.66 mL/min — ABNORMAL LOW (ref >60.00)
Potassium: 4.2 mEq/L (ref 3.5–5.1)
Sodium: 140 mEq/L (ref 135–145)
Total Protein: 7.4 g/dL (ref 6.0–8.3)

## 2013-02-06 LAB — TSH: TSH: 2.41 u[IU]/mL (ref 0.35–5.50)

## 2013-02-13 ENCOUNTER — Ambulatory Visit (INDEPENDENT_AMBULATORY_CARE_PROVIDER_SITE_OTHER): Payer: Medicare Other | Admitting: Family Medicine

## 2013-02-13 ENCOUNTER — Encounter: Payer: Self-pay | Admitting: Family Medicine

## 2013-02-13 VITALS — BP 118/86 | HR 58 | Temp 97.4°F | Ht 71.75 in | Wt 188.8 lb

## 2013-02-13 DIAGNOSIS — E7800 Pure hypercholesterolemia, unspecified: Secondary | ICD-10-CM | POA: Diagnosis not present

## 2013-02-13 DIAGNOSIS — R7309 Other abnormal glucose: Secondary | ICD-10-CM

## 2013-02-13 DIAGNOSIS — I10 Essential (primary) hypertension: Secondary | ICD-10-CM | POA: Diagnosis not present

## 2013-02-13 DIAGNOSIS — Z23 Encounter for immunization: Secondary | ICD-10-CM | POA: Diagnosis not present

## 2013-02-13 DIAGNOSIS — R0789 Other chest pain: Secondary | ICD-10-CM

## 2013-02-13 DIAGNOSIS — R739 Hyperglycemia, unspecified: Secondary | ICD-10-CM

## 2013-02-13 DIAGNOSIS — R071 Chest pain on breathing: Secondary | ICD-10-CM

## 2013-02-13 MED ORDER — LISINOPRIL 5 MG PO TABS: 5.0000 mg | ORAL_TABLET | ORAL | Status: DC

## 2013-02-13 NOTE — Assessment & Plan Note (Signed)
After a fall on the ice  Some tenderness of R lower lat ribs on exam Pt declines xray  Disc s/s to watch for -worse pain or sob  Disc in detail safety to prev falls- no more working on ice

## 2013-02-13 NOTE — Assessment & Plan Note (Signed)
Reviewed lipids Baseline very low HDL -have not been able to raise it  Disc exercise- is getting back to walking now- and diet is good Disc goals for lipids and reasons to control them Rev labs with pt Rev low sat fat diet in detail  F/u 6 mo

## 2013-02-13 NOTE — Assessment & Plan Note (Signed)
a1c is 6.6- warned pt about sugar in diet Will start walking and quit sweet tea Re check 6 mo  utd opthy as well On ace

## 2013-02-13 NOTE — Assessment & Plan Note (Signed)
bp in fair control at this time  No changes needed  Disc lifstyle change with low sodium diet and exercise  Refilled ace occ cough- but not bad per pt - tolerating Reviewed labs today

## 2013-02-13 NOTE — Progress Notes (Signed)
Subjective:    Patient ID: Robert Fitzgerald, male    DOB: 07/14/1941, 72 y.o.   MRN: [id number]  HPI Here for check up of chronic medical conditions and to review health mt list   Doing well overall - and happy to turn 72  About the same  Wt is up 1 lb with bmi of 25  Td- needs that   Zoster vaccine- insurance does not cover currently and cannot afford it yet   Flu vaccine- got that in the fall  colonosc 9/11  Falls- slipped on the ice twice last week- but did not hurt himself  (going for mailbox and then pumping water off pool cover)  He has pain in right lower ribs laterally -- a little pain to take a deep breath  He declines rib xrays unless his pain worsens    Mood- is in a good mood- no depression  Memory is the same - baseline   bp is stable today  No cp or palpitations or headaches or edema  No side effects to medicines  BP Readings from Last 3 Encounters:  02/13/13 118/86  08/08/12 125/80  02/09/12 124/88     Mild DM Lab Results  Component Value Date   HGBA1C 6.6* 02/06/2013   this is up from 6.4 Diet -no change  Is still watching out for sugar   Lab Results  Component Value Date   CHOL 170 02/06/2013   CHOL 190 02/09/2012   CHOL 157 02/10/2011   Lab Results  Component Value Date   HDL 24.70* 02/06/2013   HDL 30.30* 02/09/2012   HDL 24.40* 02/10/2011   Lab Results  Component Value Date   LDLCALC 110* 02/06/2013   LDLCALC 104* 11/10/2009   LDLCALC 102* 05/13/2009   Lab Results  Component Value Date   TRIG 178.0* 02/06/2013   TRIG 329.0* 02/09/2012   TRIG 251.0* 02/10/2011   Lab Results  Component Value Date   CHOLHDL 7 02/06/2013   CHOLHDL 6 02/09/2012   CHOLHDL 6 02/10/2011   Lab Results  Component Value Date   LDLDIRECT 119.1 02/09/2012   LDLDIRECT 106.2 02/10/2011   LDLDIRECT 93.5 08/10/2010   is a little less active in the winter - usually walks daily Started back yesterday Low HDL is chronic   No prostate cancer in family No urinary  problems  No nocturia as a rule  No hx of BPH ED is the same   Patient Active Problem List  Diagnosis  . HYPERCHOLESTEROLEMIA  . ERECTILE DYSFUNCTION  . TINNITUS, CHRONIC  . ESSENTIAL HYPERTENSION  . HIATAL HERNIA WITH REFLUX  . BACK PAIN  . MEMORY LOSS  . HEAD TRAUMA, CLOSED  . Hyperglycemia  . Right-sided chest wall pain   Past Medical History  Diagnosis Date  . 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 Headache, post-traumatic, chronic   . Hypertension   . Chronic back pain   . ED (erectile dysfunction)   . Hyperlipidemia   . Tinnitus     chronic  . GERD (gastroesophageal reflux disease)   . Diabetes mellitus     Type 2, mild  . Diverticulitis 1992   Past Surgical History  Procedure Laterality Date  . Back surgery      x 6- including fusion  . Shoulder surgery  1999    2011 for bone spur  . Nasal septum surgery  1999  . Circumcision     History  Substance Use  Topics  . Smoking status: Never Smoker   . Smokeless tobacco: Not on file  . Alcohol Use: No   No family history on file. Allergies  Allergen Reactions  . Aspirin     REACTION: GI upset  . Codeine Sulfate     REACTION: nausea  . Naproxen Sodium     REACTION: GI upset   Current Outpatient Prescriptions on File Prior to Visit  Medication Sig Dispense Refill  . cyclobenzaprine (FLEXERIL) 10 MG tablet TAKE ONE TABLET BY MOUTH TWICE DAILY AS NEEDED FOR BACK PAIN  60 tablet  5  . VIAGRA 100 MG tablet TAKE ONE TABLET BY MOUTH APPROX 30 MINUTES BEFORE SEXUAL ACTIVITY AS NEEDED  30 tablet  3   No current facility-administered medications on file prior to visit.    Review of Systems Review of Systems  Constitutional: Negative for fever, appetite change, fatigue and unexpected weight change.  Eyes: Negative for pain and visual disturbance.  Respiratory: Negative for cough and shortness of breath.   Cardiovascular: Negative for cp or palpitations    Gastrointestinal: Negative for  nausea, diarrhea and constipation.  Genitourinary: Negative for urgency and frequency.  Skin: Negative for pallor or rash   MSK pos for baseline back pain / pos for R rib pain (no sob), neg for joint swelling  Neurological: Negative for weakness, light-headedness, numbness and headaches.  Hematological: Negative for adenopathy. Does not bruise/bleed easily.  Psychiatric/Behavioral: Negative for dysphoric mood. The patient is not nervous/anxious.  pos for baseline poor short term memory       Objective:   Physical Exam  Constitutional: He appears well-developed and well-nourished. No distress.  HENT:  Head: Normocephalic and atraumatic.  Right Ear: External ear normal.  Left Ear: External ear normal.  Nose: Nose normal.  Mouth/Throat: Oropharynx is clear and moist. No oropharyngeal exudate.  Eyes: Conjunctivae and EOM are normal. Pupils are equal, round, and reactive to light. Right eye exhibits no discharge. Left eye exhibits no discharge. No scleral icterus.  Neck: Normal range of motion. Neck supple. No JVD present. Carotid bruit is not present. No thyromegaly present.  Cardiovascular: Normal rate, regular rhythm, normal heart sounds and intact distal pulses.  Exam reveals no gallop.   Pulmonary/Chest: Effort normal and breath sounds normal. No respiratory distress. He has no wheezes. He has no rales. He exhibits tenderness.  Mild R lower/laterat rib tenderness without crepitus or skin change  Abdominal: Soft. Bowel sounds are normal. He exhibits no distension, no abdominal bruit and no mass. There is no tenderness.  Musculoskeletal: He exhibits no edema and no tenderness.  Lymphadenopathy:    He has no cervical adenopathy.  Neurological: He is alert. He has normal reflexes. No cranial nerve deficit. He exhibits normal muscle tone. Coordination normal.  Skin: Skin is warm and dry. No rash noted. No erythema. No pallor.  Psychiatric: His mood appears not anxious. He does not exhibit a  depressed mood. He exhibits abnormal recent memory.  Cheerful Poor short term memory          Assessment & Plan:

## 2013-02-13 NOTE — Patient Instructions (Addendum)
I will refil your blood pressure medicine  Tetanus shot today Please call if your side/ back pain gets worse so we can do some xrays  Follow up in 6 months with labs prior

## 2013-04-19 ENCOUNTER — Other Ambulatory Visit: Payer: Self-pay | Admitting: Family Medicine

## 2013-05-09 ENCOUNTER — Other Ambulatory Visit: Payer: Self-pay | Admitting: Family Medicine

## 2013-05-09 NOTE — Telephone Encounter (Signed)
He can have 60 with 5 refils, thanks

## 2013-05-09 NOTE — Telephone Encounter (Signed)
Electronic refill request, Ok to refill? 

## 2013-05-09 NOTE — Telephone Encounter (Signed)
done

## 2013-05-09 NOTE — Telephone Encounter (Signed)
He can have 60 with 5 refils, thanks 

## 2013-08-05 ENCOUNTER — Telehealth: Payer: Self-pay | Admitting: Family Medicine

## 2013-08-05 DIAGNOSIS — I10 Essential (primary) hypertension: Secondary | ICD-10-CM

## 2013-08-05 DIAGNOSIS — R739 Hyperglycemia, unspecified: Secondary | ICD-10-CM

## 2013-08-05 DIAGNOSIS — E7800 Pure hypercholesterolemia, unspecified: Secondary | ICD-10-CM

## 2013-08-05 NOTE — Telephone Encounter (Signed)
Message copied by [name] on Sun Aug 05, 2013  1:04 PM ------      Message from: Alvina Chou      Created: Wed Aug 01, 2013 12:28 PM      Regarding: Lab orders for Monday, 8.11.14       Lab for a 6 month f/u ------

## 2013-08-06 ENCOUNTER — Other Ambulatory Visit (INDEPENDENT_AMBULATORY_CARE_PROVIDER_SITE_OTHER): Payer: Medicare Other

## 2013-08-06 DIAGNOSIS — E7800 Pure hypercholesterolemia, unspecified: Secondary | ICD-10-CM

## 2013-08-06 DIAGNOSIS — R7309 Other abnormal glucose: Secondary | ICD-10-CM

## 2013-08-06 DIAGNOSIS — R739 Hyperglycemia, unspecified: Secondary | ICD-10-CM

## 2013-08-06 DIAGNOSIS — I10 Essential (primary) hypertension: Secondary | ICD-10-CM | POA: Diagnosis not present

## 2013-08-06 LAB — COMPREHENSIVE METABOLIC PANEL
ALT: 19 U/L (ref 0–53)
AST: 21 U/L (ref 0–37)
Alkaline Phosphatase: 74 U/L (ref 39–117)
BUN: 12 mg/dL (ref 6–23)
Calcium: 9.2 mg/dL (ref 8.4–10.5)
Chloride: 105 mEq/L (ref 96–112)
Creatinine, Ser: 1.4 mg/dL (ref 0.4–1.5)
Potassium: 4.3 mEq/L (ref 3.5–5.1)

## 2013-08-06 LAB — LIPID PANEL
Cholesterol: 181 mg/dL (ref 0–200)
Total CHOL/HDL Ratio: 6
Triglycerides: 234 mg/dL — ABNORMAL HIGH (ref 0.0–149.0)
VLDL: 46.8 mg/dL — ABNORMAL HIGH (ref 0.0–40.0)

## 2013-08-06 LAB — LDL CHOLESTEROL, DIRECT: Direct LDL: 112.2 mg/dL

## 2013-08-13 ENCOUNTER — Encounter: Payer: Self-pay | Admitting: Family Medicine

## 2013-08-13 ENCOUNTER — Ambulatory Visit (INDEPENDENT_AMBULATORY_CARE_PROVIDER_SITE_OTHER): Payer: Medicare Other | Admitting: Family Medicine

## 2013-08-13 VITALS — BP 126/82 | HR 138 | Temp 99.0°F | Ht 71.75 in | Wt 186.5 lb

## 2013-08-13 DIAGNOSIS — I498 Other specified cardiac arrhythmias: Secondary | ICD-10-CM

## 2013-08-13 DIAGNOSIS — R000 Tachycardia, unspecified: Secondary | ICD-10-CM

## 2013-08-13 DIAGNOSIS — R7309 Other abnormal glucose: Secondary | ICD-10-CM

## 2013-08-13 DIAGNOSIS — E7800 Pure hypercholesterolemia, unspecified: Secondary | ICD-10-CM | POA: Diagnosis not present

## 2013-08-13 DIAGNOSIS — I10 Essential (primary) hypertension: Secondary | ICD-10-CM

## 2013-08-13 DIAGNOSIS — R739 Hyperglycemia, unspecified: Secondary | ICD-10-CM

## 2013-08-13 DIAGNOSIS — I471 Supraventricular tachycardia: Secondary | ICD-10-CM | POA: Insufficient documentation

## 2013-08-13 NOTE — Assessment & Plan Note (Signed)
bp in fair control at this time  No changes needed  Disc lifstyle change with low sodium diet and exercise  Brief episode of SVT converted with valsalva today- did refer to cardiol

## 2013-08-13 NOTE — Patient Instructions (Addendum)
Your heart went back into normal rhythm today in the office  We will do a cardiology referral at check out  If you have re occurrence of symptoms - and any chest pain or shortness of breath - or any improvement with the maneuver we reviewed-go to the ER and let me know  Avoid caffeine and avoid excessive stress Do not take any aleve or other pain medicines over the counter  For sugar/ hyperglycemia - avoid sweets and excessive carbohydrates  Keep well hydrated with water  Labs are overall stable (keep working on diet) Avoid red meat/ fried foods/ egg yolks/ fatty breakfast meats/ butter, cheese and high fat dairy/ and shellfish

## 2013-08-13 NOTE — Progress Notes (Signed)
Subjective:    Patient ID: Robert Fitzgerald, male    DOB: 04-15-1941, 72 y.o.   MRN: [id number]  HPI Here for f/u but started having palpitations today   Back was hurting this am 4:00 Took 2 generic aleve then and then repeated dose 1:30 pm (too early) Started feeling palpitations/ jitteriness just as he arrived here just now   This has happened before and his heart went back to normal (he just had a shot in shoulder- to numb it just before surgery) Heart jumped out of rhythm and then went back into it quickly   Aleve tends to cause this also   EKG shows rate to be 157 with no P waves and ST depression  - read as SVT  In office - did carotid massage and pt tried valsalva HR went down into the high 90s  Lab Results  Component Value Date   HGBA1C 6.6* 08/06/2013   this is the same as last time/ hyperglycemia He is back to eating cookies again  Using sugar free products    Lab Results  Component Value Date   CHOL 181 08/06/2013   CHOL 170 02/06/2013   CHOL 190 02/09/2012   Lab Results  Component Value Date   HDL 30.10* 08/06/2013   HDL 24.70* 02/06/2013   HDL 30.30* 02/09/2012   Lab Results  Component Value Date   LDLCALC 110* 02/06/2013   LDLCALC 104* 11/10/2009   LDLCALC 102* 05/13/2009   Lab Results  Component Value Date   TRIG 234.0* 08/06/2013   TRIG 178.0* 02/06/2013   TRIG 329.0* 02/09/2012   Lab Results  Component Value Date   CHOLHDL 6 08/06/2013   CHOLHDL 7 02/06/2013   CHOLHDL 6 02/09/2012   Lab Results  Component Value Date   LDLDIRECT 112.2 08/06/2013   LDLDIRECT 119.1 02/09/2012   LDLDIRECT 106.2 02/10/2011    Overall stable with that   Patient Active Problem List   Diagnosis Date Noted  . SVT (supraventricular tachycardia) 08/13/2013  . Right-sided chest wall pain 02/13/2013  . Hyperglycemia 08/11/2011  . MEMORY LOSS 07/11/2008  . ESSENTIAL HYPERTENSION 02/01/2008  . BACK PAIN 02/01/2008  . HYPERCHOLESTEROLEMIA 01/31/2008  . ERECTILE DYSFUNCTION  01/31/2008  . TINNITUS, CHRONIC 01/31/2008  . HIATAL HERNIA WITH REFLUX 01/31/2008  . HEAD TRAUMA, CLOSED 01/31/2008   Past Medical History  Diagnosis Date  . 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 Headache, post-traumatic, chronic   . Hypertension   . Chronic back pain   . ED (erectile dysfunction)   . Hyperlipidemia   . Tinnitus     chronic  . GERD (gastroesophageal reflux disease)   . Diabetes mellitus     Type 2, mild  . Diverticulitis 1992   Past Surgical History  Procedure Laterality Date  . Back surgery      x 6- including fusion  . Shoulder surgery  1999    2011 for bone spur  . Nasal septum surgery  1999  . Circumcision     History  Substance Use Topics  . Smoking status: Never Smoker   . Smokeless tobacco: Not on file  . Alcohol Use: No   No family history on file. Allergies  Allergen Reactions  . Aspirin     REACTION: GI upset  . Codeine Sulfate     REACTION: nausea  . Naproxen Sodium     REACTION: GI upset   Current Outpatient Prescriptions on File Prior to  Visit  Medication Sig Dispense Refill  . cyclobenzaprine (FLEXERIL) 10 MG tablet TAKE ONE TABLET BY MOUTH TWICE DAILY AS NEEDED FOR BACK PAIN  60 tablet  5  . lisinopril (PRINIVIL,ZESTRIL) 5 MG tablet Take 1 tablet (5 mg total) by mouth every morning.  90 tablet  3  . VIAGRA 100 MG tablet TAKE ONE TABLET BY MOUTH APPROXIMATELY 30 MINUTES BEFORE SEXUAL ACTIVITY AS NEEDED. DO NOT USE MORE THAN ONE DOSE DAILY  30 tablet  1   No current facility-administered medications on file prior to visit.     Review of Systems    Review of Systems  Constitutional: Negative for fever, appetite change, fatigue and unexpected weight change.  Eyes: Negative for pain and visual disturbance.  Respiratory: Negative for cough and shortness of breath.   Cardiovascular: Negative for cp or sob/ neg for PND or orthopnea or edema or leg pain , pos for rapid heart beat   Gastrointestinal: Negative for  nausea, diarrhea and constipation.  Genitourinary: Negative for urgency and frequency.  Skin: Negative for pallor or rash   Neurological: Negative for weakness, light-headedness, numbness and headaches.  Hematological: Negative for adenopathy. Does not bruise/bleed easily.  Psychiatric/Behavioral: Negative for dysphoric mood. The patient is not nervous/anxious.      Objective:   Physical Exam  Constitutional: He appears well-developed and well-nourished. No distress.  HENT:  Head: Normocephalic and atraumatic.  Mouth/Throat: Oropharynx is clear and moist.  Eyes: Conjunctivae and EOM are normal. Pupils are equal, round, and reactive to light. Right eye exhibits no discharge. Left eye exhibits no discharge. No scleral icterus.  Neck: Normal range of motion. Neck supple. No JVD present. Carotid bruit is not present. No tracheal deviation present. No thyromegaly present.  Cardiovascular: Intact distal pulses.  Exam reveals no gallop.   No murmur heard. Initially rapid rate in 160s After valsalva/ carotid massage - rate in high 90s   EKG changed from SVT to NSR rate of 93 while in the office   Pulmonary/Chest: Effort normal and breath sounds normal. No respiratory distress. He has no wheezes. He has no rales.  No crackles   Abdominal: Soft. Bowel sounds are normal. He exhibits no distension, no abdominal bruit and no mass. There is no tenderness.  Musculoskeletal: He exhibits no edema.  No edema/tenderness or palp cords  Lymphadenopathy:    He has no cervical adenopathy.  Neurological: He is alert. He has normal reflexes. No cranial nerve deficit. He exhibits normal muscle tone. Coordination normal.  Skin: Skin is warm and dry. No rash noted. No pallor.  Psychiatric: He has a normal mood and affect.  Baseline with hx of organic brain dysfunction          Assessment & Plan:

## 2013-08-13 NOTE — Assessment & Plan Note (Signed)
This is new but per pt has happened before  Suspect psvt Resolved today with valsalva  Will ref to cardiology - for further assessment -pt does have cardiac risk factors

## 2013-08-13 NOTE — Assessment & Plan Note (Signed)
A1C is stable at 6.6  Disc imp of low glycemic diet and exercise

## 2013-08-13 NOTE — Assessment & Plan Note (Signed)
Disc goals for lipids and reasons to control them Rev labs with pt Rev low sat fat diet in detail   

## 2013-08-14 ENCOUNTER — Encounter: Payer: Self-pay | Admitting: Cardiology

## 2013-08-14 ENCOUNTER — Ambulatory Visit (INDEPENDENT_AMBULATORY_CARE_PROVIDER_SITE_OTHER): Payer: Medicare Other | Admitting: Cardiology

## 2013-08-14 VITALS — BP 122/80 | HR 81 | Ht 75.0 in | Wt 184.1 lb

## 2013-08-14 DIAGNOSIS — I498 Other specified cardiac arrhythmias: Secondary | ICD-10-CM

## 2013-08-14 DIAGNOSIS — I10 Essential (primary) hypertension: Secondary | ICD-10-CM | POA: Diagnosis not present

## 2013-08-14 DIAGNOSIS — I471 Supraventricular tachycardia: Secondary | ICD-10-CM

## 2013-08-14 NOTE — Progress Notes (Signed)
HPI The patient presents for evaluation of narrow complex tachycardia. He had this going to this coincidently recently while he was at his primary care office.  I have reviewed these records and EKG. He had a narrow complex tachycardia rate 157. He said he had this before in the past after taking Aleve. He had in fact taken Aleve prior to his recent appointment and thinks on both occasions this trigger her heart rate. He apparently had another episode of this about 10 years ago in relation to some shoulder surgery. Apparently with the most recent episode he was told to bear down and this did terminate the rhythm. He felt lightheaded with this but he had no chest pressure, neck or arm discomfort. He's had no shortness of breath, PND or orthopnea. He has had no weight gain or edema. He has some back pain but he otherwise remains very active. He has not had any presyncope or syncope.  Allergies  Allergen Reactions  . Aspirin     REACTION: GI upset  . Codeine Sulfate     REACTION: nausea  . Naproxen Sodium     REACTION: GI upset    Current Outpatient Prescriptions  Medication Sig Dispense Refill  . cyclobenzaprine (FLEXERIL) 10 MG tablet TAKE ONE TABLET BY MOUTH TWICE DAILY AS NEEDED FOR BACK PAIN  60 tablet  5  . lisinopril (PRINIVIL,ZESTRIL) 5 MG tablet Take 1 tablet (5 mg total) by mouth every morning.  90 tablet  3  . VIAGRA 100 MG tablet TAKE ONE TABLET BY MOUTH APPROXIMATELY 30 MINUTES BEFORE SEXUAL ACTIVITY AS NEEDED. DO NOT USE MORE THAN ONE DOSE DAILY  30 tablet  1   No current facility-administered medications for this visit.    Past Medical History  Diagnosis Date  . 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 Headache, post-traumatic, chronic   . Hypertension   . Chronic back pain   . ED (erectile dysfunction)   . Hyperlipidemia     Diet controlled  . Tinnitus     chronic  . GERD (gastroesophageal reflux disease)   . Hyperglycemia     Type 2, mild  .  Diverticulitis 1992    Past Surgical History  Procedure Laterality Date  . Back surgery      x 6- including fusion  . Shoulder surgery  1999    2011 for bone spur  . Nasal septum surgery  1999  . Circumcision      Family History  Problem Relation Age of Onset  . Aneurysm Brother     Brain    History   Social History  . Marital Status: Married    Spouse Name: N/A    Number of Children: 1  . Years of Education: N/A   Occupational History  . Retired    Social History Main Topics  . Smoking status: Never Smoker   . Smokeless tobacco: Not on file  . Alcohol Use: No  . Drug Use: No  . Sexual Activity: Not on file   Other Topics Concern  . Not on file   Social History Narrative   Lives at home with wife.      ROS: Back pain.  Otherwise as stated in the HPI and negative for all other systems.  PHYSICAL EXAM BP 122/80  Pulse 81  Ht 6\' 3"  (1.905 m)  Wt 184 lb 1.9 oz (83.516 kg)  BMI 23.01 kg/m2  SpO2 97% GENERAL:  Well  appearing, Dentures HEENT:  Pupils equal round and reactive, fundi not visualized, oral mucosa unremarkable NECK:  No jugular venous distention, waveform within normal limits, carotid upstroke brisk and symmetric, no bruits, no thyromegaly LYMPHATICS:  No cervical, inguinal adenopathy LUNGS:  Clear to auscultation bilaterally BACK:  No CVA tenderness CHEST:  Unremarkable HEART:  PMI not displaced or sustained,S1 and S2 within normal limits, no S3, no S4, no clicks, no rubs, no murmurs ABD:  Flat, positive bowel sounds normal in frequency in pitch, no bruits, no rebound, no guarding, no midline pulsatile mass, no hepatomegaly, no splenomegaly EXT:  2 plus pulses throughout, no edema, no cyanosis no clubbing SKIN:  No rashes no nodules NEURO:  Cranial nerves II through XII grossly intact, motor grossly intact throughout [facility]:  Cognitively intact, oriented to person place and time   EKG:  08/13/13  Supraventricular tachycardia, rate 157, low  voltage in limb leads, no acute ST-T wave changes. 08/14/2013  ASSESSMENT AND PLAN:  SVT:  He has had this rarely. We discussed vagal maneuvers which seemed to work yesterday. At this like to avoid ablation as this has been getting frequent.  If he has further episodes of this will also consider beta blockers for management but for now as his only had 2 episodes I will continue medications as listed.  HTN:  The blood pressure is at target. No change in medications is indicated. We will continue with therapeutic lifestyle changes (TLC). As above he has increasing palpitations and crit had switched to beta blocker or calcium channel blocker.

## 2013-08-14 NOTE — Patient Instructions (Addendum)
The current medical regimen is effective;  continue present plan and medications.  Follow up as needed 

## 2013-08-28 ENCOUNTER — Emergency Department (HOSPITAL_COMMUNITY)
Admission: EM | Admit: 2013-08-28 | Discharge: 2013-08-28 | Disposition: A | Discharge status: Home / Self Care | Payer: Medicare Other | Attending: Emergency Medicine | Admitting: Emergency Medicine

## 2013-08-28 ENCOUNTER — Encounter (HOSPITAL_COMMUNITY): Payer: Self-pay | Admitting: *Deleted

## 2013-08-28 ENCOUNTER — Emergency Department (HOSPITAL_COMMUNITY): Payer: Medicare Other

## 2013-08-28 ENCOUNTER — Telehealth: Payer: Self-pay | Admitting: Family Medicine

## 2013-08-28 DIAGNOSIS — M279 Disease of jaws, unspecified: Secondary | ICD-10-CM | POA: Diagnosis not present

## 2013-08-28 DIAGNOSIS — S0993XA Unspecified injury of face, initial encounter: Secondary | ICD-10-CM | POA: Insufficient documentation

## 2013-08-28 DIAGNOSIS — Z8639 Personal history of other endocrine, nutritional and metabolic disease: Secondary | ICD-10-CM | POA: Insufficient documentation

## 2013-08-28 DIAGNOSIS — Z87448 Personal history of other diseases of urinary system: Secondary | ICD-10-CM | POA: Diagnosis not present

## 2013-08-28 DIAGNOSIS — Z862 Personal history of diseases of the blood and blood-forming organs and certain disorders involving the immune mechanism: Secondary | ICD-10-CM | POA: Insufficient documentation

## 2013-08-28 DIAGNOSIS — Z8719 Personal history of other diseases of the digestive system: Secondary | ICD-10-CM | POA: Diagnosis not present

## 2013-08-28 DIAGNOSIS — I10 Essential (primary) hypertension: Secondary | ICD-10-CM | POA: Diagnosis not present

## 2013-08-28 DIAGNOSIS — Z8669 Personal history of other diseases of the nervous system and sense organs: Secondary | ICD-10-CM | POA: Diagnosis not present

## 2013-08-28 DIAGNOSIS — R6884 Jaw pain: Secondary | ICD-10-CM

## 2013-08-28 DIAGNOSIS — W1809XA Striking against other object with subsequent fall, initial encounter: Secondary | ICD-10-CM | POA: Insufficient documentation

## 2013-08-28 DIAGNOSIS — G8929 Other chronic pain: Secondary | ICD-10-CM | POA: Diagnosis not present

## 2013-08-28 DIAGNOSIS — Y9289 Other specified places as the place of occurrence of the external cause: Secondary | ICD-10-CM | POA: Insufficient documentation

## 2013-08-28 DIAGNOSIS — Z87828 Personal history of other (healed) physical injury and trauma: Secondary | ICD-10-CM | POA: Diagnosis not present

## 2013-08-28 DIAGNOSIS — Y9389 Activity, other specified: Secondary | ICD-10-CM | POA: Insufficient documentation

## 2013-08-28 MED ORDER — ACETAMINOPHEN 325 MG PO TABS
650.0000 mg | ORAL_TABLET | Freq: Once | ORAL | Status: AC
  Administered 2013-08-28: 650 mg via ORAL
  Filled 2013-08-28: qty 2

## 2013-08-28 NOTE — Telephone Encounter (Signed)
Patient Information:  Caller Name: Nicholos Johns  Phone: [phone]  Patient: Robert Fitzgerald, Robert Fitzgerald  Gender: Male  DOB: Dec 06, 1941  Age: 72 Years  PCP: [facility] [employer] [facility])  Office Follow Up:  Does the office need to follow up with this patient?: No  Instructions For The Office: N/A  RN Note:  Pt fell out back door at 530am on 8-30, missed bottom step and landed concrete, chin first. Laceration approx 2 inch wide.  Pt is drinking and urinating.  Swelling is getting worse, breathing normally, Pt has painful swallowing, severe pain when touched.  Consulted w/ Dr Royden Purl RN MD agreed w/ ED dispo.  Symptoms  Reason For Call & Symptoms: Tongue and Jaw swelling w/ Lac on Right Jaw.  Reviewed Health History In EMR: Yes  Reviewed Medications In EMR: Yes  Reviewed Allergies In EMR: Yes  Reviewed Surgeries / Procedures: Yes  Date of Onset of Symptoms: 08/25/2013  Treatments Tried: neosporin  Treatments Tried Worked: No  Guideline(s) Used:  Mouth Injury  Disposition Per Guideline:   Go to ED Now (or to Office with PCP Approval)  Reason For Disposition Reached:   Closing the mouth and biting down does not feel normal  Advice Given:  N/A  RN Overrode Recommendation:  Go To ED  w/ MD approval

## 2013-08-28 NOTE — ED Provider Notes (Signed)
CSN: [id number]     Arrival date & time 08/28/13  1438 History   First MD Initiated Contact with Patient 08/28/13 1720     Chief Complaint  Patient presents with  . Fall   (Consider location/radiation/quality/duration/timing/severity/associated sxs/prior Treatment) Patient is a 72 y.o. male presenting with fall and facial injury. The history is provided by the patient. No language interpreter was used.  Fall This is a new problem. The current episode started in the past 7 days.  Facial Injury Mechanism of injury:  Fall Location:  Face Time since incident:  4 days Pain details:    Quality:  Throbbing   Severity:  Moderate   Timing:  Intermittent Chronicity:  New Foreign body present:  No foreign bodies Ineffective treatments:  None tried Associated symptoms: no loss of consciousness and no malocclusion     Past Medical History  Diagnosis Date  . 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 Headache, post-traumatic, chronic   . Hypertension   . Chronic back pain   . ED (erectile dysfunction)   . Hyperlipidemia     Diet controlled  . Tinnitus     chronic  . GERD (gastroesophageal reflux disease)   . Hyperglycemia     Type 2, mild  . Diverticulitis 1992   Past Surgical History  Procedure Laterality Date  . Back surgery      x 6- including fusion  . Shoulder surgery  1999    2011 for bone spur  . Nasal septum surgery  1999  . Circumcision     Family History  Problem Relation Age of Onset  . Aneurysm Brother     Brain   History  Substance Use Topics  . Smoking status: Never Smoker   . Smokeless tobacco: Not on file  . Alcohol Use: No    Review of Systems  HENT: Positive for facial swelling and mouth sores.   Neurological: Negative for loss of consciousness.  All other systems reviewed and are negative.    Allergies  Aspirin; Codeine sulfate; and Naproxen sodium  Home Medications   Current Outpatient Rx  Name  Route  Sig  Dispense   Refill  . cyclobenzaprine (FLEXERIL) 10 MG tablet   Oral   Take 10 mg by mouth 3 (three) times daily as needed for muscle spasms.         Marland Kitchen lisinopril (PRINIVIL,ZESTRIL) 5 MG tablet   Oral   Take 1 tablet (5 mg total) by mouth every morning.   90 tablet   3   . sildenafil (VIAGRA) 100 MG tablet   Oral   Take 100 mg by mouth daily as needed for erectile dysfunction.          BP 127/77  Pulse 82  Temp(Src) 98 F (36.7 C) (Oral)  Resp 16  SpO2 97% Physical Exam  Nursing note and vitals reviewed. Constitutional: He is oriented to person, place, and time. He appears well-developed and well-nourished.  HENT:  Head: Normocephalic.    Eyes: Conjunctivae are normal. Pupils are equal, round, and reactive to light.  Neck: Normal range of motion.    Cardiovascular: Normal rate, regular rhythm, normal heart sounds and intact distal pulses.   Pulmonary/Chest: Effort normal and breath sounds normal.  Abdominal: Soft. Bowel sounds are normal.  Musculoskeletal: Normal range of motion. He exhibits no edema and no tenderness.  Neurological: He is alert and oriented to person, place, and time.  Skin: Skin is  warm and dry.  Psychiatric: He has a normal mood and affect. His behavior is normal. Judgment and thought content normal.    ED Course  Procedures (including critical care time) Labs Review Labs Reviewed - No data to display Imaging Review No results found. Radiology results reviewed and shared with patient and family.  Patient discussed with and seen by Dr. Freida Busman. MDM   Non-suturable laceration at angle of jaw on right side.  No underlying fracture.    Jimmye Norman, NP 08/29/13 [phone]

## 2013-08-28 NOTE — ED Notes (Signed)
CT called and verified that pt is next on the list  Pt and family notified

## 2013-08-28 NOTE — ED Notes (Signed)
Patient eating hamburger and french fries. Tolerated Tylenol well per MAR.

## 2013-08-28 NOTE — ED Notes (Signed)
Pt reports falling Saturday morning and hit his chin/jaw on cement. Has small laceration noted under his chin. Reports jaw is painful but pt is able to open his mouth all the way, is concerned bc now having swelling inside his mouth, under his tongue. Airway is intact, able to speak in full sentences. No swelling noted to tongue.

## 2013-08-28 NOTE — ED Notes (Signed)
Patient transported to CT 

## 2013-08-28 NOTE — ED Provider Notes (Signed)
Medical screening examination/treatment/procedure(s) were conducted as a shared visit with non-physician practitioner(s) and myself.  I personally evaluated the patient during the encounter  Patient nonsuturable laceration to her right jaw. X-rays are negative at this time. His airway is stable. No signs of angioedema. He is stable for discharge  Toy Baker, MD 08/28/13 2004

## 2013-08-28 NOTE — ED Notes (Signed)
Patient reports pain in lower back middle of back. Rates pain 0/10 at this time. States pain was bad in back this morning while eating breakfast and pain in mouth.

## 2013-08-29 NOTE — ED Provider Notes (Signed)
Medical screening examination/treatment/procedure(s) were performed by non-physician practitioner and as supervising physician I was immediately available for consultation/collaboration.  Jovonda Selner T Nikola Marone, MD 08/29/13 2308 

## 2013-09-13 ENCOUNTER — Other Ambulatory Visit: Payer: Self-pay

## 2013-09-13 MED ORDER — SILDENAFIL CITRATE 100 MG PO TABS: 100.0000 mg | ORAL_TABLET | Freq: Every day | ORAL | Status: DC | PRN

## 2013-09-13 NOTE — Telephone Encounter (Signed)
Rx sent to pharmacy and pt's wife notified Rx sent

## 2013-09-13 NOTE — Telephone Encounter (Signed)
Please refill times 6

## 2013-09-13 NOTE — Telephone Encounter (Signed)
Robert Fitzgerald left v/m requesting refill viagra to [facility]. Please advise.

## 2013-09-18 DIAGNOSIS — Z23 Encounter for immunization: Secondary | ICD-10-CM | POA: Diagnosis not present

## 2013-10-26 ENCOUNTER — Other Ambulatory Visit: Payer: Self-pay | Admitting: Family Medicine

## 2013-10-26 NOTE — Telephone Encounter (Signed)
Electronic refill request, please advise  

## 2013-10-28 NOTE — Telephone Encounter (Signed)
Refilled electronically 

## 2013-12-31 ENCOUNTER — Other Ambulatory Visit: Payer: Self-pay | Admitting: Family Medicine

## 2014-01-01 NOTE — Telephone Encounter (Signed)
Mrs Robert Fitzgerald left v/m requesting cb ASAP on status of Viagra refill.

## 2014-01-01 NOTE — Telephone Encounter (Signed)
OK - I will up the # so he does not have to pick up as often- he can let the pharmacist know if he cannot afford 30 at a time and get the amt he wants Thanks  Sent electronically

## 2014-01-01 NOTE — Telephone Encounter (Signed)
Wife says he has used all of these refills and he does not have insurance, they pay for the medication out of pocket.  [facility] [place].

## 2014-01-01 NOTE — Telephone Encounter (Signed)
Mrs Robert Fitzgerald request status of refill for viagra to [facility] [place].Please advise.

## 2014-01-01 NOTE — Telephone Encounter (Signed)
Given #10 with 5 refills three mo ago - does he have refills remaining? Will his in cover more than 10 per mo?

## 2014-01-02 NOTE — Telephone Encounter (Signed)
Pt's wife notified Rx sent to pharmacy. 

## 2014-02-25 ENCOUNTER — Other Ambulatory Visit: Payer: Self-pay | Admitting: *Deleted

## 2014-02-25 MED ORDER — LISINOPRIL 5 MG PO TABS: 5.0000 mg | ORAL_TABLET | ORAL | Status: DC

## 2014-04-16 ENCOUNTER — Other Ambulatory Visit: Payer: Self-pay

## 2014-04-16 ENCOUNTER — Other Ambulatory Visit: Payer: Self-pay | Admitting: Family Medicine

## 2014-04-16 MED ORDER — SILDENAFIL CITRATE 100 MG PO TABS: ORAL_TABLET | ORAL | Status: DC

## 2014-04-16 NOTE — Telephone Encounter (Signed)
Patient advised.

## 2014-04-16 NOTE — Telephone Encounter (Signed)
Electronic refill request, please advise  

## 2014-04-16 NOTE — Telephone Encounter (Signed)
Will refill electronically  

## 2014-04-16 NOTE — Telephone Encounter (Signed)
Mrs Robert Fitzgerald left v/m requesting refill viagra 100 mg # [facility]. Mrs Robert Fitzgerald request cb when refilled.

## 2014-05-15 ENCOUNTER — Other Ambulatory Visit: Payer: Self-pay | Admitting: Family Medicine

## 2014-07-24 ENCOUNTER — Encounter: Payer: Self-pay | Admitting: Family Medicine

## 2014-07-24 ENCOUNTER — Ambulatory Visit (INDEPENDENT_AMBULATORY_CARE_PROVIDER_SITE_OTHER): Payer: Medicare Other | Admitting: Family Medicine

## 2014-07-24 VITALS — BP 110/86 | HR 68 | Temp 99.6°F | Ht 75.0 in | Wt 180.8 lb

## 2014-07-24 DIAGNOSIS — I10 Essential (primary) hypertension: Secondary | ICD-10-CM | POA: Diagnosis not present

## 2014-07-24 DIAGNOSIS — J209 Acute bronchitis, unspecified: Secondary | ICD-10-CM | POA: Diagnosis not present

## 2014-07-24 MED ORDER — BENZONATATE 200 MG PO CAPS: 200.0000 mg | ORAL_CAPSULE | Freq: Three times a day (TID) | ORAL | Status: DC | PRN

## 2014-07-24 MED ORDER — AZITHROMYCIN 250 MG PO TABS: ORAL_TABLET | ORAL | Status: DC

## 2014-07-24 NOTE — Progress Notes (Signed)
Subjective:    Patient ID: Robert Fitzgerald, male    DOB: July 06, 1941, 73 y.o.   MRN: [id number]  HPI Here with a bad cough - started 6-7 weeks ago -the whole summer  Getting worse the past 2-3 d (esp in am)   Fever -low grade  Felt hot at night - suspects fever this week  Had chills one am   A little ST from cough and his ribs hurt   He coughs up phlegm- clear to brown  No ear pain No congestion or sinus pain   Patient Active Problem List   Diagnosis Date Noted  . Acute bronchitis 07/24/2014  . SVT (supraventricular tachycardia) 08/13/2013  . Right-sided chest wall pain 02/13/2013  . Hyperglycemia 08/11/2011  . MEMORY LOSS 07/11/2008  . ESSENTIAL HYPERTENSION 02/01/2008  . BACK PAIN 02/01/2008  . HYPERCHOLESTEROLEMIA 01/31/2008  . ERECTILE DYSFUNCTION 01/31/2008  . TINNITUS, CHRONIC 01/31/2008  . HIATAL HERNIA WITH REFLUX 01/31/2008  . HEAD TRAUMA, CLOSED 01/31/2008   Past Medical History  Diagnosis Date  . 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 Headache, post-traumatic, chronic   . Hypertension   . Chronic back pain   . ED (erectile dysfunction)   . Hyperlipidemia     Diet controlled  . Tinnitus     chronic  . GERD (gastroesophageal reflux disease)   . Hyperglycemia     Type 2, mild  . Diverticulitis 1992   Past Surgical History  Procedure Laterality Date  . Back surgery      x 6- including fusion  . Shoulder surgery  1999    2011 for bone spur  . Nasal septum surgery  1999  . Circumcision     History  Substance Use Topics  . Smoking status: Never Smoker   . Smokeless tobacco: Not on file  . Alcohol Use: No   Family History  Problem Relation Age of Onset  . Aneurysm Brother     Brain   Allergies  Allergen Reactions  . Aspirin     REACTION: GI upset  . Codeine Sulfate     REACTION: nausea  . Naproxen Sodium     REACTION: GI upset   Current Outpatient Prescriptions on File Prior to Visit  Medication Sig Dispense  Refill  . cyclobenzaprine (FLEXERIL) 10 MG tablet TAKE ONE TABLET BY MOUTH TWICE DAILY AS NEEDED FOR BACK  PAIN  60 tablet  5  . lisinopril (PRINIVIL,ZESTRIL) 5 MG tablet TAKE ONE TABLET BY MOUTH ONCE DAILY IN THE MORNING  90 tablet  0  . sildenafil (VIAGRA) 100 MG tablet TAKE ONE TABLET BY MOUTH ONCE DAILY AS NEEDED FOR  ERECTILE  DYSFUNCTION.  10 tablet  5   No current facility-administered medications on file prior to visit.    Review of Systems Review of Systems  Constitutional: Negative for fever, appetite change,  and unexpected weight change. pos for fatigue ENT neg for sinus pain or congestion  Eyes: Negative for pain and visual disturbance.  Respiratory: Negative for wheeze  and shortness of breath.  pos for rib soreness  Cardiovascular: Negative for cp or palpitations    Gastrointestinal: Negative for nausea, diarrhea and constipation.  Genitourinary: Negative for urgency and frequency.  Skin: Negative for pallor or rash   Neurological: Negative for weakness, light-headedness, numbness and headaches.  Hematological: Negative for adenopathy. Does not bruise/bleed easily.  Psychiatric/Behavioral: Negative for dysphoric mood. The patient is not nervous/anxious.  Objective:   Physical Exam  Constitutional: He appears well-developed and well-nourished. No distress.  HENT:  Head: Normocephalic and atraumatic.  Right Ear: External ear normal.  Left Ear: External ear normal.  Mouth/Throat: Oropharynx is clear and moist. No oropharyngeal exudate.  Nares are boggy but clear Scant clear post nasal drip  No sinus tenderness  Eyes: Conjunctivae and EOM are normal. Pupils are equal, round, and reactive to light. Right eye exhibits no discharge. Left eye exhibits no discharge. No scleral icterus.  Neck: Normal range of motion. Neck supple.  Cardiovascular: Normal rate and regular rhythm.   Pulmonary/Chest: Effort normal and breath sounds normal. No respiratory distress. He has  no wheezes. He has no rales. He exhibits no tenderness.  Harsh bs  occ scattered rhonchi No wheeze  No crackles   Cough is barky and loud   Musculoskeletal: He exhibits no edema.  Lymphadenopathy:    He has no cervical adenopathy.  Neurological: He is alert.  Skin: Skin is warm and dry. No rash noted. No erythema.  Psychiatric: He has a normal mood and affect.          Assessment & Plan:   Problem List Items Addressed This Visit     Cardiovascular and Mediastinum   ESSENTIAL HYPERTENSION - Primary     Lisinopril may add to cough  Will hold it 2 weeks and start back and update      Respiratory   Acute bronchitis     With cough /prod and low grade fever  Cover with zithromax Tessalon for cough Disc symptomatic care - see instructions on AVS  Also hold ace for 2 wk Update if not starting to improve in a week or if worsening

## 2014-07-24 NOTE — Progress Notes (Signed)
Pre visit review using our clinic review tool, if applicable. No additional management support is needed unless otherwise documented below in the visit note. 

## 2014-07-24 NOTE — Patient Instructions (Signed)
I think you have bronchitis  Take the zithromax as directed  Try tessalon pills for cough  - swallow whole Hold lisinipril for 2 weeks while getting better -this can worsen cough  Update if not starting to improve in a week or if worsening

## 2014-07-24 NOTE — Assessment & Plan Note (Signed)
With cough /prod and low grade fever  Cover with zithromax Tessalon for cough Disc symptomatic care - see instructions on AVS  Also hold ace for 2 wk Update if not starting to improve in a week or if worsening

## 2014-07-24 NOTE — Assessment & Plan Note (Signed)
Lisinopril may add to cough  Will hold it 2 weeks and start back and update

## 2014-08-29 ENCOUNTER — Other Ambulatory Visit: Payer: Self-pay | Admitting: Family Medicine

## 2014-08-29 NOTE — Telephone Encounter (Signed)
Electronic refill request, pt had one acute appt in July but before that no recent f/u or CPE, please advise

## 2014-08-29 NOTE — Telephone Encounter (Signed)
Please schedule f/u in the fall with labs prior and refill until then

## 2014-08-30 NOTE — Telephone Encounter (Signed)
Left voicemail requesting pt to call office 

## 2014-09-03 NOTE — Telephone Encounter (Signed)
appt scheduled and med refilled 

## 2014-09-18 DIAGNOSIS — Z23 Encounter for immunization: Secondary | ICD-10-CM | POA: Diagnosis not present

## 2014-10-01 ENCOUNTER — Telehealth: Payer: Self-pay | Admitting: Family Medicine

## 2014-10-01 DIAGNOSIS — R739 Hyperglycemia, unspecified: Secondary | ICD-10-CM

## 2014-10-01 DIAGNOSIS — F528 Other sexual dysfunction not due to a substance or known physiological condition: Secondary | ICD-10-CM

## 2014-10-01 DIAGNOSIS — I10 Essential (primary) hypertension: Secondary | ICD-10-CM

## 2014-10-01 DIAGNOSIS — E7800 Pure hypercholesterolemia, unspecified: Secondary | ICD-10-CM

## 2014-10-01 NOTE — Telephone Encounter (Signed)
Message copied by Judy PimpleWER, Irine Heminger A on Tue Oct 01, 2014  8:28 AM ------      Message from: Alvina ChouWALSH, TERRI J      Created: Thu Sep 26, 2014 11:23 AM      Regarding: Lab orders for Wednesday, 10.7.15       Lab orders for a 6 month f/u ------

## 2014-10-02 ENCOUNTER — Other Ambulatory Visit (INDEPENDENT_AMBULATORY_CARE_PROVIDER_SITE_OTHER): Payer: Medicare Other

## 2014-10-02 DIAGNOSIS — R739 Hyperglycemia, unspecified: Secondary | ICD-10-CM

## 2014-10-02 DIAGNOSIS — I10 Essential (primary) hypertension: Secondary | ICD-10-CM | POA: Diagnosis not present

## 2014-10-02 DIAGNOSIS — E7800 Pure hypercholesterolemia, unspecified: Secondary | ICD-10-CM

## 2014-10-02 LAB — CBC WITH DIFFERENTIAL/PLATELET
BASOS PCT: 0.7 % (ref 0.0–3.0)
Basophils Absolute: 0.1 10*3/uL (ref 0.0–0.1)
Eosinophils Absolute: 0.5 10*3/uL (ref 0.0–0.7)
Eosinophils Relative: 3.1 % (ref 0.0–5.0)
HEMATOCRIT: 47.5 % (ref 39.0–52.0)
Hemoglobin: 15.5 g/dL (ref 13.0–17.0)
LYMPHS ABS: 5.9 10*3/uL — AB (ref 0.7–4.0)
Lymphocytes Relative: 40.1 % (ref 12.0–46.0)
MCHC: 32.6 g/dL (ref 30.0–36.0)
MCV: 92.8 fl (ref 78.0–100.0)
MONO ABS: 1.4 10*3/uL — AB (ref 0.1–1.0)
Monocytes Relative: 9.6 % (ref 3.0–12.0)
Neutro Abs: 6.9 10*3/uL (ref 1.4–7.7)
Neutrophils Relative %: 46.5 % (ref 43.0–77.0)
Platelets: 323 10*3/uL (ref 150.0–400.0)
RBC: 5.11 Mil/uL (ref 4.22–5.81)
RDW: 14.3 % (ref 11.5–15.5)
WBC: 14.8 10*3/uL — ABNORMAL HIGH (ref 4.0–10.5)

## 2014-10-02 LAB — COMPREHENSIVE METABOLIC PANEL
ALBUMIN: 3.6 g/dL (ref 3.5–5.2)
ALT: 19 U/L (ref 0–53)
AST: 23 U/L (ref 0–37)
Alkaline Phosphatase: 92 U/L (ref 39–117)
BUN: 14 mg/dL (ref 6–23)
CALCIUM: 9.4 mg/dL (ref 8.4–10.5)
CO2: 25 mEq/L (ref 19–32)
Chloride: 105 mEq/L (ref 96–112)
Creatinine, Ser: 1.5 mg/dL (ref 0.4–1.5)
GFR: 49.56 mL/min — ABNORMAL LOW (ref >60.00)
Glucose, Bld: 202 mg/dL — ABNORMAL HIGH (ref 70–99)
POTASSIUM: 3.7 meq/L (ref 3.5–5.1)
SODIUM: 139 meq/L (ref 135–145)
TOTAL PROTEIN: 7.9 g/dL (ref 6.0–8.3)
Total Bilirubin: 0.5 mg/dL (ref 0.2–1.2)

## 2014-10-02 LAB — LIPID PANEL
Cholesterol: 191 mg/dL (ref 0–200)
HDL: 28.2 mg/dL — ABNORMAL LOW (ref >39.00)
NONHDL: 162.8
Total CHOL/HDL Ratio: 7
Triglycerides: 253 mg/dL — ABNORMAL HIGH (ref 0.0–149.0)
VLDL: 50.6 mg/dL — AB (ref 0.0–40.0)

## 2014-10-02 LAB — HEMOGLOBIN A1C: Hgb A1c MFr Bld: 6.5 % (ref 4.6–6.5)

## 2014-10-02 LAB — TSH: TSH: 2.49 u[IU]/mL (ref 0.35–4.50)

## 2014-10-02 LAB — LDL CHOLESTEROL, DIRECT: LDL DIRECT: 117.3 mg/dL

## 2014-10-08 ENCOUNTER — Other Ambulatory Visit: Payer: Self-pay | Admitting: Family Medicine

## 2014-10-08 NOTE — Telephone Encounter (Signed)
Will refill electronically  

## 2014-10-08 NOTE — Telephone Encounter (Signed)
Electronic refill request, please advise  

## 2014-10-09 ENCOUNTER — Encounter: Payer: Self-pay | Admitting: Family Medicine

## 2014-10-09 ENCOUNTER — Ambulatory Visit (INDEPENDENT_AMBULATORY_CARE_PROVIDER_SITE_OTHER): Payer: Medicare Other | Admitting: Family Medicine

## 2014-10-09 VITALS — BP 130/82 | HR 79 | Temp 97.6°F | Ht 75.0 in | Wt 183.0 lb

## 2014-10-09 DIAGNOSIS — D72829 Elevated white blood cell count, unspecified: Secondary | ICD-10-CM | POA: Diagnosis not present

## 2014-10-09 DIAGNOSIS — R739 Hyperglycemia, unspecified: Secondary | ICD-10-CM

## 2014-10-09 DIAGNOSIS — E7800 Pure hypercholesterolemia, unspecified: Secondary | ICD-10-CM

## 2014-10-09 DIAGNOSIS — I10 Essential (primary) hypertension: Secondary | ICD-10-CM

## 2014-10-09 DIAGNOSIS — R8290 Unspecified abnormal findings in urine: Secondary | ICD-10-CM

## 2014-10-09 NOTE — Progress Notes (Signed)
Pre visit review using our clinic review tool, if applicable. No additional management support is needed unless otherwise documented below in the visit note. 

## 2014-10-09 NOTE — Patient Instructions (Signed)
Please bring a urine specimen back tomorrow or Friday  Also we will do a chest xray today  I want to make sure you do not have an infection  Your sugar is high - so I need you to start eliminating sugar and sweets from your diet (including sugar drinks) Also keep exercising  For cholesterol (Avoid red meat/ fried foods/ egg yolks/ fatty breakfast meats/ butter, cheese and high fat dairy/ and shellfish )  Follow up in 3 months with labs prior

## 2014-10-09 NOTE — Progress Notes (Signed)
Subjective:    Patient ID: Robert Fitzgerald, male    DOB: 05-21-1941, 73 y.o.   MRN: [id number]  HPI Here for f.u of chronic medical problems   Wt is up 3 lb bmi of 22   Has been feeling well overall   Wbc is elevated  Lab Results  Component Value Date   WBC 14.8* 10/02/2014   HGB 15.5 10/02/2014   HCT 47.5 10/02/2014   MCV 92.8 10/02/2014   PLT 323.0 10/02/2014   he has not been on steroids lately  Has "had a lot of stress on me"  Worried about his daughter with problems and also has had a lot of loss lately in family - incl sibs and nephews  Wife has shingles  No symptoms of infection  Cough is better/ no urinary symptoms   Hyperglycemia Lab Results  Component Value Date   HGBA1C 6.5 10/02/2014   up a bit (was 6.3)  Glucose random was 202 (he had just had OJ) He is eating sweets   He is active -around the house  Walks 2 mi per day   Hyperlipidemia  Lab Results  Component Value Date   CHOL 191 10/02/2014   CHOL 181 08/06/2013   CHOL 170 02/06/2013   Lab Results  Component Value Date   HDL 28.20* 10/02/2014   HDL 30.10* 08/06/2013   HDL 24.70* 02/06/2013   Lab Results  Component Value Date   LDLCALC 110* 02/06/2013   LDLCALC 104* 11/10/2009   LDLCALC 102* 05/13/2009   Lab Results  Component Value Date   TRIG 253.0* 10/02/2014   TRIG 234.0* 08/06/2013   TRIG 178.0* 02/06/2013   Lab Results  Component Value Date   CHOLHDL 7 10/02/2014   CHOLHDL 6 08/06/2013   CHOLHDL 7 02/06/2013   Lab Results  Component Value Date   LDLDIRECT 117.3 10/02/2014   LDLDIRECT 112.2 08/06/2013   LDLDIRECT 119.1 02/09/2012   this also correlates with increased glucose      bp is stable today  No cp or palpitations or headaches or edema  No side effects to medicines  BP Readings from Last 3 Encounters:  10/09/14 130/82  07/24/14 110/86  08/28/13 129/82      Patient Active Problem List   Diagnosis Date Noted  . SVT (supraventricular tachycardia) 08/13/2013  . Right-sided  chest wall pain 02/13/2013  . Hyperglycemia 08/11/2011  . MEMORY LOSS 07/11/2008  . Essential hypertension 02/01/2008  . BACK PAIN 02/01/2008  . HYPERCHOLESTEROLEMIA 01/31/2008  . ERECTILE DYSFUNCTION 01/31/2008  . TINNITUS, CHRONIC 01/31/2008  . HIATAL HERNIA WITH REFLUX 01/31/2008  . HEAD TRAUMA, CLOSED 01/31/2008   Past Medical History  Diagnosis Date  . 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 Headache, post-traumatic, chronic   . Hypertension   . Chronic back pain   . ED (erectile dysfunction)   . Hyperlipidemia     Diet controlled  . Tinnitus     chronic  . GERD (gastroesophageal reflux disease)   . Hyperglycemia     Type 2, mild  . Diverticulitis 1992   Past Surgical History  Procedure Laterality Date  . Back surgery      x 6- including fusion  . Shoulder surgery  1999    2011 for bone spur  . Nasal septum surgery  1999  . Circumcision     History  Substance Use Topics  . Smoking status: Never Smoker   . Smokeless tobacco:  Not on file  . Alcohol Use: No   Family History  Problem Relation Age of Onset  . Aneurysm Brother     Brain   Allergies  Allergen Reactions  . Aspirin     REACTION: GI upset  . Codeine Sulfate     REACTION: nausea  . Naproxen Sodium     REACTION: GI upset   Current Outpatient Prescriptions on File Prior to Visit  Medication Sig Dispense Refill  . benzonatate (TESSALON) 200 MG capsule Take 1 capsule (200 mg total) by mouth 3 (three) times daily as needed for cough (swallow whole).  30 capsule  1  . cyclobenzaprine (FLEXERIL) 10 MG tablet TAKE ONE TABLET BY MOUTH TWICE DAILY AS NEEDED FOR  BACK  PAIN  60 tablet  5  . lisinopril (PRINIVIL,ZESTRIL) 5 MG tablet Take 1 tablet (5 mg total) by mouth daily.  90 tablet  0  . sildenafil (VIAGRA) 100 MG tablet TAKE ONE TABLET BY MOUTH ONCE DAILY AS NEEDED FOR  ERECTILE  DYSFUNCTION.  10 tablet  5   No current facility-administered medications on file prior to visit.      Review of Systems Review of Systems  Constitutional: Negative for fever, appetite change, fatigue and unexpected weight change.  Eyes: Negative for pain and visual disturbance.  Respiratory: Negative for cough and shortness of breath.   Cardiovascular: Negative for cp or palpitations    Gastrointestinal: Negative for nausea, diarrhea and constipation.  Genitourinary: Negative for urgency and frequency.  Skin: Negative for pallor or rash   MSK pos for chronic back pain  Neurological: Negative for weakness, light-headedness, numbness and headaches.  Hematological: Negative for adenopathy. Does not bruise/bleed easily.  Psychiatric/Behavioral: Negative for dysphoric mood. The patient is not nervous/anxious.         Objective:   Physical Exam  Constitutional: He appears well-developed and well-nourished. No distress.  HENT:  Head: Normocephalic and atraumatic.  Mouth/Throat: Oropharynx is clear and moist.  Eyes: Conjunctivae and EOM are normal. Pupils are equal, round, and reactive to light. Right eye exhibits no discharge. Left eye exhibits no discharge. No scleral icterus.  Neck: Normal range of motion. Neck supple. No JVD present. Carotid bruit is not present. No thyromegaly present.  Cardiovascular: Normal rate, regular rhythm, normal heart sounds and intact distal pulses.  Exam reveals no gallop.   Pulmonary/Chest: Effort normal. No respiratory distress. He has no wheezes. He has no rales.  Abdominal: Soft. Bowel sounds are normal. He exhibits no distension and no mass. There is no tenderness.  Musculoskeletal: He exhibits no edema.  Poor rom of LS  Cannot lie back on the table   Lymphadenopathy:    He has no cervical adenopathy.  Neurological: He is alert. He has normal reflexes. No cranial nerve deficit. He exhibits normal muscle tone. Coordination normal.  Skin: Skin is warm and dry. No rash noted. No erythema. No pallor.  Psychiatric: He has a normal mood and affect.           Assessment & Plan:   Problem List Items Addressed This Visit     Cardiovascular and Mediastinum   Essential hypertension - Primary      bp is stable today  No cp or palpitations or headaches or edema  No side effects to medicines  BP Readings from Last 3 Encounters:  10/09/14 130/82  07/24/14 110/86  08/28/13 129/82     Labs reviewed       Other   HYPERCHOLESTEROLEMIA  Disc goals for lipids and reasons to control them Rev labs with pt Rev low sat fat diet in detail  Trig are up (correl with inc glucose) HDL is down-disc imp of exercise      Hyperglycemia      Lab Results  Component Value Date   HGBA1C 6.5 10/02/2014   Disc cuttoff for dx of DM Rev diet-needs to get away from sugar /sweets and excess carbs  Enc further exercise     Leukocytosis     New  ? Source No infx symptoms  Will check cxr (machine is broken- and will return) Also ua (cannot give sample)-will return as well Then make a plan    Relevant Orders      DG Chest 2 View

## 2014-10-10 ENCOUNTER — Telehealth: Payer: Self-pay | Admitting: Family Medicine

## 2014-10-10 ENCOUNTER — Ambulatory Visit (INDEPENDENT_AMBULATORY_CARE_PROVIDER_SITE_OTHER)
Admission: RE | Admit: 2014-10-10 | Discharge: 2014-10-10 | Disposition: A | Discharge status: Home / Self Care | Payer: Medicare Other | Source: Ambulatory Visit | Attending: Family Medicine | Admitting: Family Medicine

## 2014-10-10 DIAGNOSIS — R8290 Unspecified abnormal findings in urine: Secondary | ICD-10-CM | POA: Diagnosis not present

## 2014-10-10 DIAGNOSIS — D72829 Elevated white blood cell count, unspecified: Secondary | ICD-10-CM | POA: Diagnosis not present

## 2014-10-10 DIAGNOSIS — R8299 Other abnormal findings in urine: Secondary | ICD-10-CM | POA: Diagnosis not present

## 2014-10-10 LAB — POCT URINALYSIS DIPSTICK
Bilirubin, UA: NEGATIVE
GLUCOSE UA: NEGATIVE
Ketones, UA: NEGATIVE
Nitrite, UA: NEGATIVE
PH UA: 6
PROTEIN UA: NEGATIVE
SPEC GRAV UA: 1.025
Urobilinogen, UA: 0.2

## 2014-10-10 NOTE — Addendum Note (Signed)
Addended by: Shon MilletWATLINGTON, Natanya Holecek M on: 10/10/2014 11:35 AM   Modules accepted: Orders

## 2014-10-10 NOTE — Assessment & Plan Note (Signed)
Disc goals for lipids and reasons to control them Rev labs with pt Rev low sat fat diet in detail  Trig are up (correl with inc glucose) HDL is down-disc imp of exercise

## 2014-10-10 NOTE — Assessment & Plan Note (Signed)
bp is stable today  No cp or palpitations or headaches or edema  No side effects to medicines  BP Readings from Last 3 Encounters:  10/09/14 130/82  07/24/14 110/86  08/28/13 129/82     Labs reviewed

## 2014-10-10 NOTE — Telephone Encounter (Signed)
emmi mailed  °

## 2014-10-10 NOTE — Assessment & Plan Note (Signed)
Lab Results  Component Value Date   HGBA1C 6.5 10/02/2014   Disc cuttoff for dx of DM Rev diet-needs to get away from sugar /sweets and excess carbs  Enc further exercise

## 2014-10-10 NOTE — Assessment & Plan Note (Signed)
New  ? Source No infx symptoms  Will check cxr (machine is broken- and will return) Also ua (cannot give sample)-will return as well Then make a plan

## 2014-10-11 ENCOUNTER — Telehealth: Payer: Self-pay | Admitting: Family Medicine

## 2014-10-11 LAB — URINE CULTURE
Colony Count: NO GROWTH
Organism ID, Bacteria: NO GROWTH

## 2014-10-11 NOTE — Telephone Encounter (Signed)
Please call patient back about chest x-ray results.

## 2014-10-21 ENCOUNTER — Other Ambulatory Visit: Payer: Self-pay | Admitting: Family Medicine

## 2014-10-21 DIAGNOSIS — D72829 Elevated white blood cell count, unspecified: Secondary | ICD-10-CM

## 2014-10-22 ENCOUNTER — Telehealth: Payer: Self-pay | Admitting: Family Medicine

## 2014-10-23 ENCOUNTER — Other Ambulatory Visit (INDEPENDENT_AMBULATORY_CARE_PROVIDER_SITE_OTHER): Payer: Medicare Other

## 2014-10-23 DIAGNOSIS — D72829 Elevated white blood cell count, unspecified: Secondary | ICD-10-CM | POA: Diagnosis not present

## 2014-10-23 LAB — CBC WITH DIFFERENTIAL/PLATELET
BASOS ABS: 0.1 10*3/uL (ref 0.0–0.1)
Basophils Relative: 0.9 % (ref 0.0–3.0)
EOS ABS: 0.3 10*3/uL (ref 0.0–0.7)
Eosinophils Relative: 2.9 % (ref 0.0–5.0)
HCT: 43.6 % (ref 39.0–52.0)
HEMOGLOBIN: 14.3 g/dL (ref 13.0–17.0)
LYMPHS ABS: 4.9 10*3/uL — AB (ref 0.7–4.0)
Lymphocytes Relative: 41.5 % (ref 12.0–46.0)
MCHC: 32.9 g/dL (ref 30.0–36.0)
MCV: 92.4 fl (ref 78.0–100.0)
MONO ABS: 1.1 10*3/uL — AB (ref 0.1–1.0)
MONOS PCT: 9.6 % (ref 3.0–12.0)
NEUTROS ABS: 5.3 10*3/uL (ref 1.4–7.7)
Neutrophils Relative %: 45.1 % (ref 43.0–77.0)
PLATELETS: 252 10*3/uL (ref 150.0–400.0)
RBC: 4.71 Mil/uL (ref 4.22–5.81)
RDW: 14.3 % (ref 11.5–15.5)
WBC: 11.8 10*3/uL — ABNORMAL HIGH (ref 4.0–10.5)

## 2014-10-24 ENCOUNTER — Encounter: Payer: Self-pay | Admitting: *Deleted

## 2014-10-29 ENCOUNTER — Other Ambulatory Visit: Payer: Self-pay | Admitting: Family Medicine

## 2014-11-24 NOTE — Telephone Encounter (Signed)
error 

## 2014-12-31 ENCOUNTER — Other Ambulatory Visit: Payer: Self-pay | Admitting: Family Medicine

## 2015-01-03 ENCOUNTER — Other Ambulatory Visit (INDEPENDENT_AMBULATORY_CARE_PROVIDER_SITE_OTHER): Payer: Medicare Other

## 2015-01-03 DIAGNOSIS — I10 Essential (primary) hypertension: Secondary | ICD-10-CM | POA: Diagnosis not present

## 2015-01-03 DIAGNOSIS — E7800 Pure hypercholesterolemia, unspecified: Secondary | ICD-10-CM

## 2015-01-03 DIAGNOSIS — R739 Hyperglycemia, unspecified: Secondary | ICD-10-CM

## 2015-01-03 LAB — BASIC METABOLIC PANEL
BUN: 11 mg/dL (ref 6–23)
CHLORIDE: 104 meq/L (ref 96–112)
CO2: 30 meq/L (ref 19–32)
Calcium: 9.4 mg/dL (ref 8.4–10.5)
Creatinine, Ser: 1.3 mg/dL (ref 0.4–1.5)
GFR: 57.52 mL/min — ABNORMAL LOW (ref >60.00)
GLUCOSE: 136 mg/dL — AB (ref 70–99)
Potassium: 4.2 mEq/L (ref 3.5–5.1)
Sodium: 140 mEq/L (ref 135–145)

## 2015-01-03 LAB — LIPID PANEL
CHOLESTEROL: 193 mg/dL (ref 0–200)
HDL: 26.7 mg/dL — ABNORMAL LOW (ref >39.00)
NONHDL: 166.3
TRIGLYCERIDES: 287 mg/dL — AB (ref 0.0–149.0)
Total CHOL/HDL Ratio: 7
VLDL: 57.4 mg/dL — ABNORMAL HIGH (ref 0.0–40.0)

## 2015-01-03 LAB — LDL CHOLESTEROL, DIRECT: LDL DIRECT: 105.4 mg/dL

## 2015-01-03 LAB — HEMOGLOBIN A1C: HEMOGLOBIN A1C: 7.1 % — AB (ref 4.6–6.5)

## 2015-01-10 ENCOUNTER — Ambulatory Visit (INDEPENDENT_AMBULATORY_CARE_PROVIDER_SITE_OTHER): Payer: Medicare Other | Admitting: Family Medicine

## 2015-01-10 ENCOUNTER — Encounter: Payer: Self-pay | Admitting: Family Medicine

## 2015-01-10 VITALS — BP 132/82 | HR 80 | Temp 98.4°F | Ht 75.0 in | Wt 187.1 lb

## 2015-01-10 DIAGNOSIS — I10 Essential (primary) hypertension: Secondary | ICD-10-CM | POA: Diagnosis not present

## 2015-01-10 DIAGNOSIS — E7800 Pure hypercholesterolemia, unspecified: Secondary | ICD-10-CM

## 2015-01-10 DIAGNOSIS — Z23 Encounter for immunization: Secondary | ICD-10-CM

## 2015-01-10 DIAGNOSIS — E119 Type 2 diabetes mellitus without complications: Secondary | ICD-10-CM | POA: Diagnosis not present

## 2015-01-10 DIAGNOSIS — R739 Hyperglycemia, unspecified: Secondary | ICD-10-CM

## 2015-01-10 MED ORDER — LISINOPRIL 5 MG PO TABS: 5.0000 mg | ORAL_TABLET | Freq: Every day | ORAL | Status: DC

## 2015-01-10 NOTE — Assessment & Plan Note (Signed)
Lab Results  Component Value Date   HGBA1C 7.1* 01/03/2015   This is way up  He is drinking sugar drinks and eating a lot of sweets 4 lb wt gain Long disc with pt and his wife re: diet  Will stop sugar drinks incl juice and also sweets Info on carb counting and meal prep for DM Re check in 3 mo and f/u  Also enc walking when he can

## 2015-01-10 NOTE — Progress Notes (Signed)
Subjective:    Patient ID: Len Azeez, male    DOB: April 10, 1941, 74 y.o.   MRN: [id number]  HPI Here for f/u of chronic medical problems   Wt is up 4 lb with bmi of 23   bp is stable today  No cp or palpitations or headaches or edema  No side effects to medicines  BP Readings from Last 3 Encounters:  01/10/15 132/82  10/09/14 130/82  07/24/14 110/86     Hyperglycemia Lab Results  Component Value Date   HGBA1C 7.1* 01/03/2015  he did eat a lot of sweets around the holidays - quite a bit  Did accidentally drink regular coke /soda instead of diet  Eats cookies -ate 3 this am   Perhaps less walking due to weather (usually walks daily)  This is up from 6.5  No excessive thirst or urination No vision problems No numbness in fingers or toes     Chemistry      Component Value Date/Time   NA 140 01/03/2015 1005   K 4.2 01/03/2015 1005   CL 104 01/03/2015 1005   CO2 30 01/03/2015 1005   BUN 11 01/03/2015 1005   CREATININE 1.3 01/03/2015 1005      Component Value Date/Time   CALCIUM 9.4 01/03/2015 1005   ALKPHOS 92 10/02/2014 1117   AST 23 10/02/2014 1117   ALT 19 10/02/2014 1117   BILITOT 0.5 10/02/2014 1117      Hyperlipidemia  Lab Results  Component Value Date   CHOL 193 01/03/2015   CHOL 191 10/02/2014   CHOL 181 08/06/2013   Lab Results  Component Value Date   HDL 26.70* 01/03/2015   HDL 28.20* 10/02/2014   HDL 30.10* 08/06/2013   Lab Results  Component Value Date   LDLCALC 110* 02/06/2013   LDLCALC 104* 11/10/2009   LDLCALC 102* 05/13/2009   Lab Results  Component Value Date   TRIG 287.0* 01/03/2015   TRIG 253.0* 10/02/2014   TRIG 234.0* 08/06/2013   Lab Results  Component Value Date   CHOLHDL 7 01/03/2015   CHOLHDL 7 10/02/2014   CHOLHDL 6 08/06/2013   Lab Results  Component Value Date   LDLDIRECT 105.4 01/03/2015   LDLDIRECT 117.3 10/02/2014   LDLDIRECT 112.2 08/06/2013   diet is not as good  Some fats/esp around the  holidays   Patient Active Problem List   Diagnosis Date Noted  . Leukocytosis 10/09/2014  . SVT (supraventricular tachycardia) 08/13/2013  . Right-sided chest wall pain 02/13/2013  . Hyperglycemia 08/11/2011  . MEMORY LOSS 07/11/2008  . Essential hypertension 02/01/2008  . BACK PAIN 02/01/2008  . HYPERCHOLESTEROLEMIA 01/31/2008  . ERECTILE DYSFUNCTION 01/31/2008  . TINNITUS, CHRONIC 01/31/2008  . HIATAL HERNIA WITH REFLUX 01/31/2008  . HEAD TRAUMA, CLOSED 01/31/2008   Past Medical History  Diagnosis Date  . 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 Headache, post-traumatic, chronic   . Hypertension   . Chronic back pain   . ED (erectile dysfunction)   . Hyperlipidemia     Diet controlled  . Tinnitus     chronic  . GERD (gastroesophageal reflux disease)   . Hyperglycemia     Type 2, mild  . Diverticulitis 1992   Past Surgical History  Procedure Laterality Date  . Back surgery      x 6- including fusion  . Shoulder surgery  1999    2011 for bone spur  . Nasal septum surgery  1999  . Circumcision     History  Substance Use Topics  . Smoking status: Never Smoker   . Smokeless tobacco: Not on file  . Alcohol Use: No   Family History  Problem Relation Age of Onset  . Aneurysm Brother     Brain   Allergies  Allergen Reactions  . Aspirin     REACTION: GI upset  . Codeine Sulfate     REACTION: nausea  . Naproxen Sodium     REACTION: GI upset   Current Outpatient Prescriptions on File Prior to Visit  Medication Sig Dispense Refill  . cyclobenzaprine (FLEXERIL) 10 MG tablet TAKE ONE TABLET BY MOUTH TWICE DAILY AS NEEDED FOR  BACK  PAIN 60 tablet 5  . lisinopril (PRINIVIL,ZESTRIL) 5 MG tablet Take 1 tablet (5 mg total) by mouth daily. 90 tablet 0  . VIAGRA 100 MG tablet TAKE ONE TABLET BY MOUTH ONCE DAILY AS NEEDED FOR ERECTILE DYSFUNCTION. 10 tablet 0   No current facility-administered medications on file prior to visit.    Review of  Systems Review of Systems  Constitutional: Negative for fever, appetite change, fatigue and unexpected weight change.  Eyes: Negative for pain and visual disturbance.  Respiratory: Negative for cough and shortness of breath.   Cardiovascular: Negative for cp or palpitations    Gastrointestinal: Negative for nausea, diarrhea and constipation.  Genitourinary: Negative for urgency and frequency.  Skin: Negative for pallor or rash   MSK pos for chronic back pain that limits activity  Neurological: Negative for weakness, light-headedness, numbness and headaches. pos for baseline memory problems  Hematological: Negative for adenopathy. Does not bruise/bleed easily.  Psychiatric/Behavioral: Negative for dysphoric mood. The patient is not nervous/anxious.         Objective:   Physical Exam  Constitutional: He appears well-developed and well-nourished. No distress.  Well appearing  HENT:  Head: Normocephalic and atraumatic.  Mouth/Throat: Oropharynx is clear and moist.  Eyes: Conjunctivae and EOM are normal. Pupils are equal, round, and reactive to light. No scleral icterus.  Neck: Normal range of motion. Neck supple. No JVD present. Carotid bruit is not present. No thyromegaly present.  Cardiovascular: Normal rate, regular rhythm and normal heart sounds.   Pulmonary/Chest: Effort normal and breath sounds normal. No respiratory distress. He has no wheezes. He has no rales.  Abdominal: Soft. Bowel sounds are normal. He exhibits no distension, no abdominal bruit and no mass. There is no tenderness.  Musculoskeletal: He exhibits no edema.  Lymphadenopathy:    He has no cervical adenopathy.  Neurological: He is alert. He has normal reflexes. No cranial nerve deficit. He exhibits normal muscle tone. Coordination normal.  Skin: Skin is warm and dry. No rash noted. No pallor.  Psychiatric: He has a normal mood and affect.  Baseline affect- pleasant  Poor short term memory           Assessment & Plan:   Problem List Items Addressed This Visit      Cardiovascular and Mediastinum   Essential hypertension - Primary    bp in fair control at this time  BP Readings from Last 1 Encounters:  01/10/15 132/82   No changes needed Disc lifstyle change with low sodium diet and exercise   Labs reviewed       Relevant Medications   lisinopril (PRINIVIL,ZESTRIL) tablet     Endocrine   Diabetes type 2, controlled    Lab Results  Component Value Date   HGBA1C 7.1* 01/03/2015  This is way up  He is drinking sugar drinks and eating a lot of sweets 4 lb wt gain Long disc with pt and his wife re: diet  Will stop sugar drinks incl juice and also sweets Info on carb counting and meal prep for DM Re check in 3 mo and f/u  Also enc walking when he can       Relevant Medications   lisinopril (PRINIVIL,ZESTRIL) tablet     Other   HYPERCHOLESTEROLEMIA    Disc goals for lipids and reasons to control them Rev labs with pt Rev low sat fat diet in detail  Trig are up- suspect due to poor glucose control Re check 3 mo and f/u  Consider med if not imp       Relevant Medications   lisinopril (PRINIVIL,ZESTRIL) tablet   Hyperglycemia    Other Visit Diagnoses    Need for vaccination with 13-polyvalent pneumococcal conjugate vaccine        Relevant Orders    Pneumococcal conjugate vaccine 13-valent (Completed)

## 2015-01-10 NOTE — Progress Notes (Signed)
Pre visit review using our clinic review tool, if applicable. No additional management support is needed unless otherwise documented below in the visit note. 

## 2015-01-10 NOTE — Assessment & Plan Note (Signed)
Disc goals for lipids and reasons to control them Rev labs with pt Rev low sat fat diet in detail  Trig are up- suspect due to poor glucose control Re check 3 mo and f/u  Consider med if not imp

## 2015-01-10 NOTE — Patient Instructions (Signed)
You have diabetes and it has progressed  Stop sugar beverages - diet is ok / no juice - drink water  Get sweets out of the house - cookies/candy etc  Sugarless gum or hard candy is ok  Watch servings of carbohydrates - potato/pasta/rice and fruit - smaller servings  Walk as much as you can  I hope this will help diabetes (glucose control), and also triglyceride levels Follow up in 3 months with labs prior

## 2015-01-10 NOTE — Assessment & Plan Note (Signed)
bp in fair control at this time  BP Readings from Last 1 Encounters:  01/10/15 132/82   No changes needed Disc lifstyle change with low sodium diet and exercise   Labs reviewed

## 2015-02-05 ENCOUNTER — Other Ambulatory Visit: Payer: Self-pay | Admitting: Family Medicine

## 2015-03-17 ENCOUNTER — Other Ambulatory Visit: Payer: Self-pay | Admitting: Family Medicine

## 2015-03-17 NOTE — Telephone Encounter (Signed)
Please refill times 3 

## 2015-03-17 NOTE — Telephone Encounter (Signed)
Viagra refill request.  Last filled 02/05/2015.  Last seen 01/10/2015.  Please advise.

## 2015-03-17 NOTE — Telephone Encounter (Signed)
Viagra called into [facility].

## 2015-03-24 ENCOUNTER — Other Ambulatory Visit: Payer: Self-pay | Admitting: Family Medicine

## 2015-04-11 ENCOUNTER — Other Ambulatory Visit (INDEPENDENT_AMBULATORY_CARE_PROVIDER_SITE_OTHER): Payer: Medicare Other

## 2015-04-11 DIAGNOSIS — E119 Type 2 diabetes mellitus without complications: Secondary | ICD-10-CM

## 2015-04-11 DIAGNOSIS — E7800 Pure hypercholesterolemia, unspecified: Secondary | ICD-10-CM

## 2015-04-11 LAB — LIPID PANEL
Cholesterol: 163 mg/dL (ref 0–200)
HDL: 29.2 mg/dL — ABNORMAL LOW (ref >39.00)
NonHDL: 133.8
Total CHOL/HDL Ratio: 6
Triglycerides: 204 mg/dL — ABNORMAL HIGH (ref 0.0–149.0)
VLDL: 40.8 mg/dL — ABNORMAL HIGH (ref 0.0–40.0)

## 2015-04-11 LAB — LDL CHOLESTEROL, DIRECT: Direct LDL: 94 mg/dL

## 2015-04-11 LAB — HEMOGLOBIN A1C: Hgb A1c MFr Bld: 6.6 % — ABNORMAL HIGH (ref 4.6–6.5)

## 2015-04-16 ENCOUNTER — Ambulatory Visit: Payer: Medicare Other | Admitting: Family Medicine

## 2015-04-23 ENCOUNTER — Ambulatory Visit (INDEPENDENT_AMBULATORY_CARE_PROVIDER_SITE_OTHER): Payer: Medicare Other | Admitting: Family Medicine

## 2015-04-23 ENCOUNTER — Encounter: Payer: Self-pay | Admitting: Family Medicine

## 2015-04-23 VITALS — BP 128/78 | HR 57 | Temp 98.0°F | Ht 75.0 in | Wt 184.0 lb

## 2015-04-23 DIAGNOSIS — E7800 Pure hypercholesterolemia, unspecified: Secondary | ICD-10-CM

## 2015-04-23 DIAGNOSIS — I10 Essential (primary) hypertension: Secondary | ICD-10-CM | POA: Diagnosis not present

## 2015-04-23 DIAGNOSIS — E119 Type 2 diabetes mellitus without complications: Secondary | ICD-10-CM | POA: Diagnosis not present

## 2015-04-23 NOTE — Progress Notes (Signed)
Pre visit review using our clinic review tool, if applicable. No additional management support is needed unless otherwise documented below in the visit note. 

## 2015-04-23 NOTE — Progress Notes (Signed)
Subjective:    Patient ID: Robert Fitzgerald, male    DOB: 1941/06/10, 74 y.o.   MRN: [id number]  HPI Here for f/u of chronic medical problems  Working hard on lower sugar in diet - wife "took it away from him"   Wt is down 3 lb with bmi of 23  bp is stable today  No cp or palpitations or headaches or edema  No side effects to medicines  BP Readings from Last 3 Encounters:  04/23/15 128/78  01/10/15 132/82  10/09/14 130/82      Diabetes Home sugar results  DM diet - a lot better getting rid of the sweets  Exercise - goes for the walks - for hours per day - loves it , also a lot of yard work - [place] acres [place]  Symptoms not really thirsty  A1C last  Lab Results  Component Value Date   HGBA1C 6.6* 04/11/2015   Improved from 7.1 Diet controlled  Renal protection on ace  Last eye exam - about 7 years ago   Hyperlipidemia Improved Lab Results  Component Value Date   CHOL 163 04/11/2015   CHOL 193 01/03/2015   CHOL 191 10/02/2014   Lab Results  Component Value Date   HDL 29.20* 04/11/2015   HDL 26.70* 01/03/2015   HDL 28.20* 10/02/2014   Lab Results  Component Value Date   LDLCALC 110* 02/06/2013   LDLCALC 104* 11/10/2009   LDLCALC 102* 05/13/2009   Lab Results  Component Value Date   TRIG 204.0* 04/11/2015   TRIG 287.0* 01/03/2015   TRIG 253.0* 10/02/2014   Lab Results  Component Value Date   CHOLHDL 6 04/11/2015   CHOLHDL 7 01/03/2015   CHOLHDL 7 10/02/2014   Lab Results  Component Value Date   LDLDIRECT 94.0 04/11/2015   LDLDIRECT 105.4 01/03/2015   LDLDIRECT 117.3 10/02/2014     low HDL - but other numbers are improved Has really been trying    Review of Systems Review of Systems  Constitutional: Negative for fever, appetite change, fatigue and unexpected weight change.  Eyes: Negative for pain and visual disturbance.  Respiratory: Negative for cough and shortness of breath.   Cardiovascular: Negative for cp or palpitations      Gastrointestinal: Negative for nausea, diarrhea and constipation.  Genitourinary: Negative for urgency and frequency.  Skin: Negative for pallor or rash   MSK pos for chronic back pain  Neurological: Negative for weakness, light-headedness, numbness and headaches.  Hematological: Negative for adenopathy. Does not bruise/bleed easily.  Psychiatric/Behavioral: Negative for dysphoric mood. The patient is not nervous/anxious.         Objective:   Physical Exam  Constitutional: He appears well-developed and well-nourished. No distress.  HENT:  Head: Normocephalic and atraumatic.  Mouth/Throat: Oropharynx is clear and moist.  Eyes: Conjunctivae and EOM are normal. Pupils are equal, round, and reactive to light.  Neck: Normal range of motion. Neck supple. No JVD present. Carotid bruit is not present. No thyromegaly present.  Cardiovascular: Normal rate, regular rhythm, normal heart sounds and intact distal pulses.  Exam reveals no gallop.   Pulmonary/Chest: Effort normal and breath sounds normal. No respiratory distress. He has no wheezes. He has no rales.  No crackles  Abdominal: Soft. Bowel sounds are normal. He exhibits no distension, no abdominal bruit and no mass. There is no tenderness.  Musculoskeletal: He exhibits no edema.  Poor rom of LS   Lymphadenopathy:    He has no cervical  adenopathy.  Neurological: He is alert. He has normal reflexes.  Skin: Skin is warm and dry. No rash noted.  Psychiatric: He has a normal mood and affect. His speech is tangential.  Baseline-tangential thought with short attention span          Assessment & Plan:   Problem List Items Addressed This Visit      Cardiovascular and Mediastinum   Essential hypertension    bp in fair control at this time  BP Readings from Last 1 Encounters:  04/23/15 128/78   No changes needed Disc lifstyle change with low sodium diet and exercise  Labs reviewed         Endocrine   Diabetes type 2,  controlled - Primary    Lab Results  Component Value Date   HGBA1C 6.6* 04/11/2015   Improved with lower sugar diet  Enc walking Enc eye exam-he will schedule his own F/u 6 mo        Other   HYPERCHOLESTEROLEMIA    Disc goals for lipids and reasons to control them Rev labs with pt Rev low sat fat diet in detail Improved Enc to keep working on low sat fat diet

## 2015-04-23 NOTE — Patient Instructions (Signed)
Keep up the good work with healthy diet and exercise  Stay away from sugar as a rule  Cholesterol and sugar control have improved  make an appointment for an eye exam please  Follow up in 6 months for annual exam with labs prior

## 2015-04-24 ENCOUNTER — Telehealth: Payer: Self-pay | Admitting: Family Medicine

## 2015-04-24 NOTE — Assessment & Plan Note (Signed)
Lab Results  Component Value Date   HGBA1C 6.6* 04/11/2015   Improved with lower sugar diet  Enc walking Enc eye exam-he will schedule his own F/u 6 mo

## 2015-04-24 NOTE — Assessment & Plan Note (Signed)
bp in fair control at this time  BP Readings from Last 1 Encounters:  04/23/15 128/78   No changes needed Disc lifstyle change with low sodium diet and exercise  Labs reviewed

## 2015-04-24 NOTE — Assessment & Plan Note (Signed)
Disc goals for lipids and reasons to control them Rev labs with pt Rev low sat fat diet in detail Improved Enc to keep working on low sat fat diet

## 2015-05-07 ENCOUNTER — Encounter: Payer: Self-pay | Admitting: Gastroenterology

## 2015-05-15 DIAGNOSIS — H2513 Age-related nuclear cataract, bilateral: Secondary | ICD-10-CM | POA: Diagnosis not present

## 2015-07-28 LAB — HM DIABETES EYE EXAM

## 2015-09-24 ENCOUNTER — Other Ambulatory Visit: Payer: Self-pay | Admitting: Family Medicine

## 2015-09-30 ENCOUNTER — Other Ambulatory Visit: Payer: Self-pay | Admitting: Family Medicine

## 2015-10-21 ENCOUNTER — Telehealth: Payer: Self-pay | Admitting: Family Medicine

## 2015-10-21 DIAGNOSIS — I10 Essential (primary) hypertension: Secondary | ICD-10-CM

## 2015-10-21 DIAGNOSIS — E119 Type 2 diabetes mellitus without complications: Secondary | ICD-10-CM

## 2015-10-21 NOTE — Telephone Encounter (Signed)
-----   Message from Alvina Chouerri J Walsh sent at 10/16/2015 11:19 AM EDT ----- Regarding: Lab orders for Thursday, 10.27.16 Patient is scheduled for CPX labs, please order future labs, Thanks , Camelia Engerri

## 2015-10-23 ENCOUNTER — Other Ambulatory Visit (INDEPENDENT_AMBULATORY_CARE_PROVIDER_SITE_OTHER): Payer: Medicare Other

## 2015-10-23 DIAGNOSIS — I10 Essential (primary) hypertension: Secondary | ICD-10-CM

## 2015-10-23 DIAGNOSIS — E119 Type 2 diabetes mellitus without complications: Secondary | ICD-10-CM | POA: Diagnosis not present

## 2015-10-23 LAB — COMPREHENSIVE METABOLIC PANEL
ALT: 19 U/L (ref 0–53)
AST: 22 U/L (ref 0–37)
Albumin: 4.2 g/dL (ref 3.5–5.2)
Alkaline Phosphatase: 85 U/L (ref 39–117)
BUN: 16 mg/dL (ref 6–23)
CALCIUM: 9.9 mg/dL (ref 8.4–10.5)
CHLORIDE: 104 meq/L (ref 96–112)
CO2: 30 meq/L (ref 19–32)
Creatinine, Ser: 1.31 mg/dL (ref 0.40–1.50)
GFR: 56.89 mL/min — AB (ref >60.00)
Glucose, Bld: 110 mg/dL — ABNORMAL HIGH (ref 70–99)
Potassium: 3.9 mEq/L (ref 3.5–5.1)
Sodium: 141 mEq/L (ref 135–145)
Total Bilirubin: 0.6 mg/dL (ref 0.2–1.2)
Total Protein: 7.7 g/dL (ref 6.0–8.3)

## 2015-10-23 LAB — CBC WITH DIFFERENTIAL/PLATELET
Basophils Absolute: 0.1 10*3/uL (ref 0.0–0.1)
Basophils Relative: 0.7 % (ref 0.0–3.0)
Eosinophils Absolute: 0.3 10*3/uL (ref 0.0–0.7)
Eosinophils Relative: 3.4 % (ref 0.0–5.0)
HEMATOCRIT: 47.1 % (ref 39.0–52.0)
Hemoglobin: 15.4 g/dL (ref 13.0–17.0)
LYMPHS PCT: 38.4 % (ref 12.0–46.0)
Lymphs Abs: 4 10*3/uL (ref 0.7–4.0)
MCHC: 32.8 g/dL (ref 30.0–36.0)
MCV: 94.1 fl (ref 78.0–100.0)
MONOS PCT: 11.9 % (ref 3.0–12.0)
Monocytes Absolute: 1.2 10*3/uL — ABNORMAL HIGH (ref 0.1–1.0)
NEUTROS ABS: 4.7 10*3/uL (ref 1.4–7.7)
Neutrophils Relative %: 45.6 % (ref 43.0–77.0)
PLATELETS: 219 10*3/uL (ref 150.0–400.0)
RBC: 5.01 Mil/uL (ref 4.22–5.81)
RDW: 14.3 % (ref 11.5–15.5)
WBC: 10.4 10*3/uL (ref 4.0–10.5)

## 2015-10-23 LAB — LIPID PANEL
CHOLESTEROL: 175 mg/dL (ref 0–200)
HDL: 31.2 mg/dL — ABNORMAL LOW (ref >39.00)
LDL CALC: 106 mg/dL — AB (ref 0–99)
NonHDL: 143.61
Total CHOL/HDL Ratio: 6
Triglycerides: 186 mg/dL — ABNORMAL HIGH (ref 0.0–149.0)
VLDL: 37.2 mg/dL (ref 0.0–40.0)

## 2015-10-23 LAB — TSH: TSH: 2.97 u[IU]/mL (ref 0.35–4.50)

## 2015-10-23 LAB — HEMOGLOBIN A1C: Hgb A1c MFr Bld: 6.2 % (ref 4.6–6.5)

## 2015-10-28 ENCOUNTER — Encounter: Payer: Medicare Other | Admitting: Family Medicine

## 2015-11-04 ENCOUNTER — Ambulatory Visit (INDEPENDENT_AMBULATORY_CARE_PROVIDER_SITE_OTHER): Payer: Medicare Other | Admitting: Family Medicine

## 2015-11-04 ENCOUNTER — Encounter: Payer: Self-pay | Admitting: Family Medicine

## 2015-11-04 VITALS — BP 142/86 | HR 47 | Temp 97.9°F | Ht 71.5 in | Wt 176.0 lb

## 2015-11-04 DIAGNOSIS — R739 Hyperglycemia, unspecified: Secondary | ICD-10-CM

## 2015-11-04 DIAGNOSIS — Z0000 Encounter for general adult medical examination without abnormal findings: Secondary | ICD-10-CM

## 2015-11-04 DIAGNOSIS — E7800 Pure hypercholesterolemia, unspecified: Secondary | ICD-10-CM

## 2015-11-04 DIAGNOSIS — I471 Supraventricular tachycardia: Secondary | ICD-10-CM | POA: Diagnosis not present

## 2015-11-04 DIAGNOSIS — Z23 Encounter for immunization: Secondary | ICD-10-CM | POA: Diagnosis not present

## 2015-11-04 DIAGNOSIS — I10 Essential (primary) hypertension: Secondary | ICD-10-CM | POA: Diagnosis not present

## 2015-11-04 MED ORDER — CYCLOBENZAPRINE HCL 10 MG PO TABS: ORAL_TABLET | ORAL | Status: DC

## 2015-11-04 MED ORDER — LISINOPRIL 5 MG PO TABS: 5.0000 mg | ORAL_TABLET | Freq: Every day | ORAL | Status: DC

## 2015-11-04 MED ORDER — SILDENAFIL CITRATE 100 MG PO TABS: ORAL_TABLET | ORAL | Status: DC

## 2015-11-04 NOTE — Patient Instructions (Signed)
Flu shot today  Take care of yourself Keep walking and eating a healthy diet Follow up in 6 months with labs prior

## 2015-11-04 NOTE — Progress Notes (Signed)
Pre visit review using our clinic review tool, if applicable. No additional management support is needed unless otherwise documented below in the visit note. 

## 2015-11-04 NOTE — Progress Notes (Signed)
Subjective:    Patient ID: Robert Fitzgerald, male    DOB: [birth year], 74 y.o.   MRN: [id number]  HPI Here for annual medicare wellness visit as well as chronic/acute medical problems   I have personally reviewed the Medicare Annual Wellness questionnaire and have noted 1. The patient's medical and social history 2. Their use of alcohol, tobacco or illicit drugs 3. Their current medications and supplements 4. The patient's functional ability including ADL's, fall risks, home safety risks and hearing or visual             impairment. 5. Diet and physical activities 6. Evidence for depression or mood disorders  The patients weight, height, BMI have been recorded in the chart and visual acuity is per eye clinic.  I have made referrals, counseling and provided education to the patient based review of the above and I have provided the pt with a written personalized care plan for preventive services. Reviewed and updated provider list, see scanned forms.  See scanned forms.  Routine anticipatory guidance given to patient.  See health maintenance. Colon cancer screening 9/11 - 10 year recall  opthy- had exam in Aug-no retinopathy Flu vaccine will do today  Tetanus vaccine 2/14 Pneumovax 1/16 complete on both  Zoster vaccine - insurance does not cover  Prostate cancer screening -declines /age -no problems urinating and no nocturia   Advance directive- has the paperwork for living will - will work on that soon  Cognitive function addressed- see scanned forms- and if abnormal then additional documentation follows.  Has baseline cognitive deficit from head injury- no changes and no worse   PMH and SH reviewed  Meds, vitals, and allergies reviewed.   ROS: See HPI.  Otherwise negative.    Wt is down 8 lb with bmi of 24 Pt states he is trying to loose -this helps his back pain and also minimizes heartburn  Walking a lot also - even if he does not want to - over 1 hour per day    bp  is up a bit today  Slow pulse at check in - not dizzy  No cp or palpitations or headaches or edema  No side effects to medicines  BP Readings from Last 3 Encounters:  11/04/15 142/86  04/23/15 128/78  01/10/15 132/82     Hyperglycemia Lab Results  Component Value Date   HGBA1C 6.2 10/23/2015  weight loss and exercise have helped a lot     Chemistry      Component Value Date/Time   NA 141 10/23/2015 1002   K 3.9 10/23/2015 1002   CL 104 10/23/2015 1002   CO2 30 10/23/2015 1002   BUN 16 10/23/2015 1002   CREATININE 1.31 10/23/2015 1002      Component Value Date/Time   CALCIUM 9.9 10/23/2015 1002   ALKPHOS 85 10/23/2015 1002   AST 22 10/23/2015 1002   ALT 19 10/23/2015 1002   BILITOT 0.6 10/23/2015 1002      Cholesterol Diet controlled Lab Results  Component Value Date   CHOL 175 10/23/2015   CHOL 163 04/11/2015   CHOL 193 01/03/2015   Lab Results  Component Value Date   HDL 31.20* 10/23/2015   HDL 29.20* 04/11/2015   HDL 26.70* 01/03/2015   Lab Results  Component Value Date   LDLCALC 106* 10/23/2015   LDLCALC 110* 02/06/2013   LDLCALC 104* 11/10/2009   Lab Results  Component Value Date   TRIG 186.0* 10/23/2015   TRIG 204.0*  04/11/2015   TRIG 287.0* 01/03/2015   Lab Results  Component Value Date   CHOLHDL 6 10/23/2015   CHOLHDL 6 04/11/2015   CHOLHDL 7 01/03/2015   Lab Results  Component Value Date   LDLDIRECT 94.0 04/11/2015   LDLDIRECT 105.4 01/03/2015   LDLDIRECT 117.3 10/02/2014   a little improvement  Trying hard to limit both sugar and fat in diet  LDL is down to 106 Chronic low HDL - walking helps a bit    Patient Active Problem List   Diagnosis Date Noted  . Medicare annual wellness visit, initial 11/04/2015  . Leukocytosis 10/09/2014  . SVT (supraventricular tachycardia) (HCC) 08/13/2013  . Right-sided chest wall pain 02/13/2013  . Hyperglycemia 08/11/2011  . MEMORY LOSS 07/11/2008  . Essential hypertension 02/01/2008  . BACK  PAIN 02/01/2008  . HYPERCHOLESTEROLEMIA 01/31/2008  . ERECTILE DYSFUNCTION 01/31/2008  . TINNITUS, CHRONIC 01/31/2008  . HIATAL HERNIA WITH REFLUX 01/31/2008  . HEAD TRAUMA, CLOSED 01/31/2008   Past Medical History  Diagnosis Date  . 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 Headache, post-traumatic, chronic   . Hypertension   . Chronic back pain   . ED (erectile dysfunction)   . Hyperlipidemia     Diet controlled  . Tinnitus     chronic  . GERD (gastroesophageal reflux disease)   . Hyperglycemia     Type 2, mild  . Diverticulitis 1992   Past Surgical History  Procedure Laterality Date  . Back surgery      x 6- including fusion  . Shoulder surgery  1999    2011 for bone spur  . Nasal septum surgery  1999  . Circumcision     Social History  Substance Use Topics  . Smoking status: Never Smoker   . Smokeless tobacco: None  . Alcohol Use: No   Family History  Problem Relation Age of Onset  . Aneurysm Brother     Brain   Allergies  Allergen Reactions  . Aspirin     REACTION: GI upset  . Codeine Sulfate     REACTION: nausea  . Naproxen Sodium     REACTION: GI upset   No current outpatient prescriptions on file prior to visit.   No current facility-administered medications on file prior to visit.     Review of Systems Review of Systems  Constitutional: Negative for fever, appetite change, fatigue and unexpected weight change.  Eyes: Negative for pain and visual disturbance.  Respiratory: Negative for cough and shortness of breath.   Cardiovascular: Negative for cp or palpitations    Gastrointestinal: Negative for nausea, diarrhea and constipation.  Genitourinary: Negative for urgency and frequency. neg for nocturia  Skin: Negative for pallor or rash   MSK pos for chroinc back pain  Neurological: Negative for weakness, light-headedness, numbness and headaches.  Hematological: Negative for adenopathy. Does not bruise/bleed easily.    Psychiatric/Behavioral: Negative for dysphoric mood. The patient is not nervous/anxious.         Objective:   Physical Exam  Constitutional: He appears well-developed and well-nourished. No distress.  Well appearing   HENT:  Head: Normocephalic and atraumatic.  Right Ear: External ear normal.  Left Ear: External ear normal.  Nose: Nose normal.  Mouth/Throat: Oropharynx is clear and moist.  Eyes: Conjunctivae and EOM are normal. Pupils are equal, round, and reactive to light. Right eye exhibits no discharge. Left eye exhibits no discharge. No scleral icterus.  Neck: Normal range  of motion. Neck supple. No JVD present. Carotid bruit is not present. No thyromegaly present.  Cardiovascular: Normal rate, regular rhythm, normal heart sounds and intact distal pulses.  Exam reveals no gallop.   Pulse 60  Pulmonary/Chest: Effort normal and breath sounds normal. No respiratory distress. He has no wheezes. He exhibits no tenderness.  Abdominal: Soft. Bowel sounds are normal. He exhibits no distension, no abdominal bruit and no mass. There is no tenderness.  Musculoskeletal: He exhibits no edema or tenderness.  Limited rom of spine  Lymphadenopathy:    He has no cervical adenopathy.  Neurological: He is alert. He has normal reflexes. No cranial nerve deficit. He exhibits normal muscle tone. Coordination normal.  Skin: Skin is warm and dry. No rash noted. No erythema. No pallor.  SKs diffusely  Psychiatric: He has a normal mood and affect.          Assessment & Plan:   Problem List Items Addressed This Visit      Cardiovascular and Mediastinum   Essential hypertension - Primary    bp in fair control at this time  BP Readings from Last 1 Encounters:  11/04/15 142/86   No changes needed Disc lifstyle change with low sodium diet and exercise  Labs reviewed       Relevant Medications   sildenafil (VIAGRA) 100 MG tablet   lisinopril (PRINIVIL,ZESTRIL) 5 MG tablet   SVT  (supraventricular tachycardia) (HCC)    No further episodes       Relevant Medications   sildenafil (VIAGRA) 100 MG tablet   lisinopril (PRINIVIL,ZESTRIL) 5 MG tablet     Endocrine   RESOLVED: Diabetes type 2, controlled (HCC)    Improved with wt loss/walking and better habits Lab Results  Component Value Date   HGBA1C 6.2 10/23/2015   Commended  F/u 6 mo  Rev low glycemic diet       Relevant Medications   lisinopril (PRINIVIL,ZESTRIL) 5 MG tablet     Other   HYPERCHOLESTEROLEMIA    Diet controlled Disc goals for lipids and reasons to control them Rev labs with pt Rev low sat fat diet in detail  Some improvement       Relevant Medications   sildenafil (VIAGRA) 100 MG tablet   lisinopril (PRINIVIL,ZESTRIL) 5 MG tablet   Hyperglycemia    Lab Results  Component Value Date   HGBA1C 6.2 10/23/2015   Doing better with diet change /walking and wt loss Commended  Rev low glycemic diet  F/u 6 mo       Medicare annual wellness visit, initial    Other Visit Diagnoses    Need for influenza vaccination        Relevant Orders    Flu Vaccine QUAD 36+ mos PF IM (Fluarix & Fluzone Quad PF) (Completed)

## 2015-11-05 ENCOUNTER — Encounter: Payer: Self-pay | Admitting: Family Medicine

## 2015-11-05 NOTE — Assessment & Plan Note (Signed)
No further episodes

## 2015-11-05 NOTE — Assessment & Plan Note (Signed)
Diet controlled Disc goals for lipids and reasons to control them Rev labs with pt Rev low sat fat diet in detail  Some improvement

## 2015-11-05 NOTE — Assessment & Plan Note (Signed)
bp in fair control at this time  BP Readings from Last 1 Encounters:  11/04/15 142/86   No changes needed Disc lifstyle change with low sodium diet and exercise  Labs reviewed

## 2015-11-05 NOTE — Assessment & Plan Note (Signed)
Lab Results  Component Value Date   HGBA1C 6.2 10/23/2015   Doing better with diet change /walking and wt loss Commended  Rev low glycemic diet  F/u 6 mo

## 2015-11-05 NOTE — Assessment & Plan Note (Signed)
Improved with wt loss/walking and better habits Lab Results  Component Value Date   HGBA1C 6.2 10/23/2015   Commended  F/u 6 mo  Rev low glycemic diet

## 2016-04-26 ENCOUNTER — Other Ambulatory Visit: Payer: Self-pay | Admitting: Family Medicine

## 2016-04-26 NOTE — Telephone Encounter (Signed)
This should be too early-perhaps one of his px expired or the pharmacy cannot honor a whole year of this-please check in with the pharmacy Thanks

## 2016-04-26 NOTE — Telephone Encounter (Signed)
Spoke with pharmacy and pt has used all of his refills on file with them. He pick up Rxs on 11/04/15 #180 tabs, 12/22/15 #180 tabs, 02/10/16 #180 tabs, and picked up his last refill on 03/24/16 #180 an now he is requesting another refill

## 2016-04-26 NOTE — Telephone Encounter (Signed)
Called pharmacy twice and no answer, phone would just ring a lot and then xfer me back to the automated system, and no one ever answered the phone, will try to call back later

## 2016-04-26 NOTE — Telephone Encounter (Signed)
Please ask his wife how he is taking it

## 2016-04-26 NOTE — Telephone Encounter (Signed)
Pt has appt scheduled on 06/09/16, last refilled on 11/04/15 #180 tabs with 3 additional refills (? If to early), please advise

## 2016-04-26 NOTE — Telephone Encounter (Signed)
Directions are to take one pill twice daily as needed - how much is he taking? (please ask his wife)- he is picking up px early and

## 2016-04-27 NOTE — Telephone Encounter (Signed)
Left voicemail requesting pt or wife to call office back

## 2016-04-27 NOTE — Telephone Encounter (Signed)
Spoke to pt and wife and they state that pt is taking med BID and that is it, wife said pt could have taken a few more for his back that she wasn't aware of but she isn't sure. Pt/wife said he doesn't know what is going on and why he got refills so soon. I went over refill dates and wife and pt still not sure what happened. I let Dr. Milinda Antisower know what they said and she said they need to schedule a f/u appt with her. I advise pt and wife of this and they scheduled a f/u on 05/04/16, wife advise to bring Rx bottles in with pt, and she verbalized understanding

## 2016-05-04 ENCOUNTER — Encounter: Payer: Self-pay | Admitting: Family Medicine

## 2016-05-04 ENCOUNTER — Ambulatory Visit (INDEPENDENT_AMBULATORY_CARE_PROVIDER_SITE_OTHER): Payer: Medicare Other | Admitting: Family Medicine

## 2016-05-04 VITALS — BP 134/90 | HR 85 | Temp 97.7°F | Ht 71.5 in | Wt 175.5 lb

## 2016-05-04 DIAGNOSIS — Z9114 Patient's other noncompliance with medication regimen: Secondary | ICD-10-CM | POA: Diagnosis not present

## 2016-05-04 NOTE — Patient Instructions (Signed)
Have you wife keep all your regular medicine in a days of the week pill box She can give you blood pressure medicine every morning  She can give you the flexeril (cyclobenzaprine) muscle relaxer up to twice daily as needed (make sure there is always 8 hour in between doses and you never take 2 pills at once  In addition to that - if more medicine is stolen we cannot replace it (so never leave medicines in the car or in a purse)- keep a close eye on it   It looks like you have about 25 pills left in the bottle  Call when you need a refill   In addition I think [facility] gave you this too early - we need to be careful about this
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
## 2016-05-04 NOTE — Progress Notes (Signed)
Pre visit review using our clinic review tool, if applicable. No additional management support is needed unless otherwise documented below in the visit note. 

## 2016-05-19 ENCOUNTER — Other Ambulatory Visit: Payer: Self-pay | Admitting: *Deleted

## 2016-05-19 MED ORDER — CYCLOBENZAPRINE HCL 10 MG PO TABS: ORAL_TABLET | ORAL | Status: DC

## 2016-05-19 NOTE — Telephone Encounter (Signed)
Left voicemail letting wife know Rx sent and pt is to only take 1 tab BID and that's the max dose

## 2016-05-19 NOTE — Telephone Encounter (Signed)
I refilled 3 mo supply  We agreed at last visit that his wife would dispense it and watch his dosing -bid maximum

## 2016-05-19 NOTE — Telephone Encounter (Signed)
Patient's wife, Olegario MessierKathy called requesting refill. Pease call her when sent in. [phone] Last filled 11/04/15.

## 2016-06-02 ENCOUNTER — Other Ambulatory Visit (INDEPENDENT_AMBULATORY_CARE_PROVIDER_SITE_OTHER): Payer: Medicare Other

## 2016-06-02 DIAGNOSIS — I10 Essential (primary) hypertension: Secondary | ICD-10-CM | POA: Diagnosis not present

## 2016-06-02 DIAGNOSIS — R739 Hyperglycemia, unspecified: Secondary | ICD-10-CM

## 2016-06-02 DIAGNOSIS — E7800 Pure hypercholesterolemia, unspecified: Secondary | ICD-10-CM | POA: Diagnosis not present

## 2016-06-02 LAB — LIPID PANEL
CHOL/HDL RATIO: 5
Cholesterol: 157 mg/dL (ref 0–200)
HDL: 28.9 mg/dL — ABNORMAL LOW (ref >39.00)
LDL CALC: 95 mg/dL (ref 0–99)
NonHDL: 127.84
TRIGLYCERIDES: 165 mg/dL — AB (ref 0.0–149.0)
VLDL: 33 mg/dL (ref 0.0–40.0)

## 2016-06-02 LAB — BASIC METABOLIC PANEL
BUN: 14 mg/dL (ref 6–23)
CO2: 29 mEq/L (ref 19–32)
Calcium: 9.7 mg/dL (ref 8.4–10.5)
Chloride: 106 mEq/L (ref 96–112)
Creatinine, Ser: 1.19 mg/dL (ref 0.40–1.50)
GFR: 63.45 mL/min (ref >60.00)
Glucose, Bld: 109 mg/dL — ABNORMAL HIGH (ref 70–99)
Potassium: 3.9 mEq/L (ref 3.5–5.1)
Sodium: 140 mEq/L (ref 135–145)

## 2016-06-02 LAB — HEMOGLOBIN A1C: Hgb A1c MFr Bld: 6.2 % (ref 4.6–6.5)

## 2016-06-09 ENCOUNTER — Ambulatory Visit (INDEPENDENT_AMBULATORY_CARE_PROVIDER_SITE_OTHER): Payer: Medicare Other | Admitting: Family Medicine

## 2016-06-09 ENCOUNTER — Encounter: Payer: Self-pay | Admitting: Family Medicine

## 2016-06-09 VITALS — BP 126/82 | HR 58 | Temp 97.6°F | Ht 71.5 in | Wt 175.5 lb

## 2016-06-09 DIAGNOSIS — G8929 Other chronic pain: Secondary | ICD-10-CM

## 2016-06-09 DIAGNOSIS — E7800 Pure hypercholesterolemia, unspecified: Secondary | ICD-10-CM

## 2016-06-09 DIAGNOSIS — I10 Essential (primary) hypertension: Secondary | ICD-10-CM

## 2016-06-09 DIAGNOSIS — M549 Dorsalgia, unspecified: Secondary | ICD-10-CM

## 2016-06-09 DIAGNOSIS — R739 Hyperglycemia, unspecified: Secondary | ICD-10-CM

## 2016-06-09 NOTE — Progress Notes (Signed)
Pre visit review using our clinic review tool, if applicable. No additional management support is needed unless otherwise documented below in the visit note. 

## 2016-06-09 NOTE — Progress Notes (Signed)
Subjective:    Patient ID: Robert Fitzgerald, male    DOB: January 15, 1941, 75 y.o.   MRN: [id number]  HPI Here for f/u of chronic health problems   Wt is stable  bmi of 24.12 Wt is down from 4/16 about 9 lb - then stabilized after the fall  occ forgets to eat and drink when working outside    bp is stable today  No cp or palpitations or headaches or edema  No side effects to medicines  BP Readings from Last 3 Encounters:  06/09/16 126/82  05/04/16 134/90  11/04/15 142/86       Chemistry      Component Value Date/Time   NA 140 06/02/2016 1005   K 3.9 06/02/2016 1005   CL 106 06/02/2016 1005   CO2 29 06/02/2016 1005   BUN 14 06/02/2016 1005   CREATININE 1.19 06/02/2016 1005      Component Value Date/Time   CALCIUM 9.7 06/02/2016 1005   ALKPHOS 85 10/23/2015 1002   AST 22 10/23/2015 1002   ALT 19 10/23/2015 1002   BILITOT 0.6 10/23/2015 1002       Hyperlipidemia Lab Results  Component Value Date   CHOL 157 06/02/2016   CHOL 175 10/23/2015   CHOL 163 04/11/2015   Lab Results  Component Value Date   HDL 28.90* 06/02/2016   HDL 31.20* 10/23/2015   HDL 29.20* 04/11/2015   Lab Results  Component Value Date   LDLCALC 95 06/02/2016   LDLCALC 106* 10/23/2015   LDLCALC 110* 02/06/2013   Lab Results  Component Value Date   TRIG 165.0* 06/02/2016   TRIG 186.0* 10/23/2015   TRIG 204.0* 04/11/2015   Lab Results  Component Value Date   CHOLHDL 5 06/02/2016   CHOLHDL 6 10/23/2015   CHOLHDL 6 04/11/2015   Lab Results  Component Value Date   LDLDIRECT 94.0 04/11/2015   LDLDIRECT 105.4 01/03/2015   LDLDIRECT 117.3 10/02/2014  risk ratio is down a point  Always had low HDL  He does eat fish    Hyperglycemia Lab Results  Component Value Date   HGBA1C 6.2 06/02/2016   No change in A1C since last draw   Patient Active Problem List   Diagnosis Date Noted  . Overuse of medication 05/04/2016  . Medicare annual wellness visit, initial 11/04/2015  .  Leukocytosis 10/09/2014  . SVT (supraventricular tachycardia) (HCC) 08/13/2013  . Right-sided chest wall pain 02/13/2013  . Hyperglycemia 08/11/2011  . MEMORY LOSS 07/11/2008  . Essential hypertension 02/01/2008  . Chronic back pain 02/01/2008  . HYPERCHOLESTEROLEMIA 01/31/2008  . ERECTILE DYSFUNCTION 01/31/2008  . TINNITUS, CHRONIC 01/31/2008  . HIATAL HERNIA WITH REFLUX 01/31/2008  . HEAD TRAUMA, CLOSED 01/31/2008   Past Medical History  Diagnosis Date  . 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 Headache, post-traumatic, chronic   . Hypertension   . Chronic back pain   . ED (erectile dysfunction)   . Hyperlipidemia     Diet controlled  . Tinnitus     chronic  . GERD (gastroesophageal reflux disease)   . Hyperglycemia     Type 2, mild  . Diverticulitis 1992   Past Surgical History  Procedure Laterality Date  . Back surgery      x 6- including fusion  . Shoulder surgery  1999    2011 for bone spur  . Nasal septum surgery  1999  . Circumcision     Social History  Substance Use Topics  . Smoking status: Never Smoker   . Smokeless tobacco: None  . Alcohol Use: No   Family History  Problem Relation Age of Onset  . Aneurysm Brother     Brain   Allergies  Allergen Reactions  . Aspirin     REACTION: GI upset  . Codeine Sulfate     REACTION: nausea  . Naproxen Sodium     REACTION: GI upset   Current Outpatient Prescriptions on File Prior to Visit  Medication Sig Dispense Refill  . cyclobenzaprine (FLEXERIL) 10 MG tablet TAKE ONE TABLET BY MOUTH TWICE DAILY AS NEEDED FOR  BACK  PAIN 180 tablet 0  . lisinopril (PRINIVIL,ZESTRIL) 5 MG tablet Take 1 tablet (5 mg total) by mouth daily. 90 tablet 3  . sildenafil (VIAGRA) 100 MG tablet TAKE ONE TABLET BY MOUTH ONCE DAILY AS NEEDED FOR  ERECTILE  DYSFUNCTION 10 tablet 11   No current facility-administered medications on file prior to visit.     Review of Systems    Review of Systems    Constitutional: Negative for fever, appetite change, fatigue and unexpected weight change.  Eyes: Negative for pain and visual disturbance.  Respiratory: Negative for cough and shortness of breath.   Cardiovascular: Negative for cp or palpitations    Gastrointestinal: Negative for nausea, diarrhea and constipation.  Genitourinary: Negative for urgency and frequency.  Skin: Negative for pallor or rash   Neurological: Negative for weakness, light-headedness, numbness and headaches.  MSK pos for chronic back pain  Hematological: Negative for adenopathy. Does not bruise/bleed easily.  Psychiatric/Behavioral: Negative for dysphoric mood. The patient is not nervous/anxious.  pos for baseline cognitive changes since a past head injury     Objective:   Physical Exam  Constitutional: He appears well-developed and well-nourished. No distress.  Well appearing   HENT:  Head: Normocephalic and atraumatic.  Mouth/Throat: Oropharynx is clear and moist.  Eyes: Conjunctivae and EOM are normal. Pupils are equal, round, and reactive to light.  Neck: Normal range of motion. Neck supple. No JVD present. Carotid bruit is not present. No thyromegaly present.  Cardiovascular: Normal rate, regular rhythm, normal heart sounds and intact distal pulses.  Exam reveals no gallop.   Pulmonary/Chest: Effort normal and breath sounds normal. No respiratory distress. He has no wheezes. He has no rales.  No crackles  Abdominal: Soft. Bowel sounds are normal. He exhibits no distension, no abdominal bruit and no mass. There is no tenderness.  Musculoskeletal: He exhibits tenderness. He exhibits no edema.  Limited rom of LS - with pain getting up and down from the table and some LS tenderness  Lymphadenopathy:    He has no cervical adenopathy.  Neurological: He is alert. He has normal reflexes. No cranial nerve deficit. He exhibits normal muscle tone. Coordination normal.  Skin: Skin is warm and dry. No rash noted. No  pallor.  Psychiatric: He has a normal mood and affect.  Baseline memory changes from previous head injury Pleasant and talkative            Assessment & Plan:   Problem List Items Addressed This Visit      Cardiovascular and Mediastinum   Essential hypertension - Primary    bp in fair control at this time  BP Readings from Last 1 Encounters:  06/09/16 126/82   No changes needed Disc lifstyle change with low sodium diet and exercise  Labs reviewed F/u 6 mo        Other  Hyperglycemia    Stable and well controlled with diet and exercise Lab Results  Component Value Date   HGBA1C 6.2 06/02/2016   No changes  Enc to continue good habits Stable wt since the fall F/u 6 mo      HYPERCHOLESTEROLEMIA    LDL is improved  Disc goals for lipids and reasons to control them Rev labs with pt Rev low sat fat diet in detail       Chronic back pain    Hx of spinal stenosis - recently worse pain from working outdoors  He declines specialist appt - but I enc him to re consider if his symptoms do not improve Has flexeril (monitoring) and enc heat /stretching

## 2016-06-09 NOTE — Patient Instructions (Signed)
Let me know if you want to see a specialist about your back -if it worsens  Try to stay active /walk --- try not to over lift or dig or kneel (that is tough on your back)  Labs look good/stable Blood pressure and weight are excellent  Don't forget to eat and drink fluids   Follow up in 6 months for your annual exam

## 2016-06-12 NOTE — Assessment & Plan Note (Signed)
LDL is improved  Disc goals for lipids and reasons to control them Rev labs with pt Rev low sat fat diet in detail

## 2016-06-12 NOTE — Assessment & Plan Note (Signed)
Stable and well controlled with diet and exercise Lab Results  Component Value Date   HGBA1C 6.2 06/02/2016   No changes  Enc to continue good habits Stable wt since the fall F/u 6 mo

## 2016-06-12 NOTE — Assessment & Plan Note (Signed)
Hx of spinal stenosis - recently worse pain from working outdoors  He declines specialist appt - but I enc him to re consider if his symptoms do not improve Has flexeril (monitoring) and enc heat /stretching

## 2016-06-12 NOTE — Assessment & Plan Note (Signed)
bp in fair control at this time  BP Readings from Last 1 Encounters:  06/09/16 126/82   No changes needed Disc lifstyle change with low sodium diet and exercise  Labs reviewed F/u 6 mo

## 2016-08-16 ENCOUNTER — Other Ambulatory Visit: Payer: Self-pay | Admitting: Family Medicine

## 2016-08-17 ENCOUNTER — Other Ambulatory Visit: Payer: Self-pay | Admitting: Family Medicine

## 2016-08-17 NOTE — Telephone Encounter (Signed)
Px written for call in   

## 2016-08-17 NOTE — Telephone Encounter (Signed)
Rx was sent electronically 

## 2016-08-17 NOTE — Telephone Encounter (Signed)
Last refill 05/19/16 #180 Last office visit 06/09/16 Okay to refill?

## 2016-08-17 NOTE — Telephone Encounter (Signed)
Ms. Jason NestJarvis left v/m requesting status of cyclobenzaprine refill.Please advise.

## 2016-10-19 DIAGNOSIS — Z23 Encounter for immunization: Secondary | ICD-10-CM | POA: Diagnosis not present

## 2016-11-15 ENCOUNTER — Other Ambulatory Visit: Payer: Self-pay | Admitting: Family Medicine

## 2016-11-15 NOTE — Telephone Encounter (Signed)
Will refill electronically  

## 2016-11-15 NOTE — Telephone Encounter (Signed)
Pt has CPE scheduled on 12/14/16, last filled on 08/17/16 #180 tabs with 0 refill, please advise

## 2016-12-09 ENCOUNTER — Telehealth: Payer: Self-pay | Admitting: Family Medicine

## 2016-12-09 DIAGNOSIS — R739 Hyperglycemia, unspecified: Secondary | ICD-10-CM

## 2016-12-09 DIAGNOSIS — E7800 Pure hypercholesterolemia, unspecified: Secondary | ICD-10-CM

## 2016-12-09 DIAGNOSIS — I10 Essential (primary) hypertension: Secondary | ICD-10-CM

## 2016-12-09 DIAGNOSIS — Z125 Encounter for screening for malignant neoplasm of prostate: Secondary | ICD-10-CM | POA: Insufficient documentation

## 2016-12-09 NOTE — Telephone Encounter (Signed)
-----   Message from Robert Bellowarlesia R Pinson, LPN sent at 16/10/960412/13/2017  2:02 PM EST ----- Regarding: Lab Orders 12/10/16 Please place lab orders, if any. Thank you.

## 2016-12-10 ENCOUNTER — Telehealth: Payer: Self-pay | Admitting: Family Medicine

## 2016-12-10 ENCOUNTER — Ambulatory Visit (INDEPENDENT_AMBULATORY_CARE_PROVIDER_SITE_OTHER): Payer: Medicare Other

## 2016-12-10 ENCOUNTER — Other Ambulatory Visit: Payer: Medicare Other

## 2016-12-10 VITALS — BP 110/68 | HR 49 | Temp 97.8°F | Ht 71.5 in | Wt 175.5 lb

## 2016-12-10 DIAGNOSIS — R739 Hyperglycemia, unspecified: Secondary | ICD-10-CM | POA: Diagnosis not present

## 2016-12-10 DIAGNOSIS — I10 Essential (primary) hypertension: Secondary | ICD-10-CM | POA: Diagnosis not present

## 2016-12-10 DIAGNOSIS — Z1159 Encounter for screening for other viral diseases: Secondary | ICD-10-CM | POA: Diagnosis not present

## 2016-12-10 DIAGNOSIS — E7800 Pure hypercholesterolemia, unspecified: Secondary | ICD-10-CM

## 2016-12-10 DIAGNOSIS — Z125 Encounter for screening for malignant neoplasm of prostate: Secondary | ICD-10-CM | POA: Diagnosis not present

## 2016-12-10 DIAGNOSIS — Z0000 Encounter for general adult medical examination without abnormal findings: Secondary | ICD-10-CM

## 2016-12-10 LAB — COMPREHENSIVE METABOLIC PANEL
ALT: 16 U/L (ref 0–53)
AST: 18 U/L (ref 0–37)
Albumin: 4.4 g/dL (ref 3.5–5.2)
Alkaline Phosphatase: 80 U/L (ref 39–117)
BUN: 21 mg/dL (ref 6–23)
CALCIUM: 9.8 mg/dL (ref 8.4–10.5)
CHLORIDE: 102 meq/L (ref 96–112)
CO2: 30 meq/L (ref 19–32)
Creatinine, Ser: 1.47 mg/dL (ref 0.40–1.50)
GFR: 49.65 mL/min — AB (ref >60.00)
GLUCOSE: 99 mg/dL (ref 70–99)
POTASSIUM: 4.7 meq/L (ref 3.5–5.1)
SODIUM: 138 meq/L (ref 135–145)
Total Bilirubin: 0.4 mg/dL (ref 0.2–1.2)
Total Protein: 7.1 g/dL (ref 6.0–8.3)

## 2016-12-10 LAB — HEPATITIS C ANTIBODY: HCV Ab: NEGATIVE

## 2016-12-10 LAB — LIPID PANEL
CHOL/HDL RATIO: 6
CHOLESTEROL: 178 mg/dL (ref 0–200)
HDL: 28.2 mg/dL — AB (ref >39.00)
NONHDL: 149.68
TRIGLYCERIDES: 235 mg/dL — AB (ref 0.0–149.0)
VLDL: 47 mg/dL — ABNORMAL HIGH (ref 0.0–40.0)

## 2016-12-10 LAB — LDL CHOLESTEROL, DIRECT: Direct LDL: 127 mg/dL

## 2016-12-10 LAB — CBC WITH DIFFERENTIAL/PLATELET
Basophils Absolute: 0.1 10*3/uL (ref 0.0–0.1)
Basophils Relative: 0.8 % (ref 0.0–3.0)
EOS ABS: 0.3 10*3/uL (ref 0.0–0.7)
EOS PCT: 2.2 % (ref 0.0–5.0)
HEMATOCRIT: 42.5 % (ref 39.0–52.0)
HEMOGLOBIN: 14.3 g/dL (ref 13.0–17.0)
LYMPHS PCT: 36.8 % (ref 12.0–46.0)
Lymphs Abs: 4.3 10*3/uL — ABNORMAL HIGH (ref 0.7–4.0)
MCHC: 33.6 g/dL (ref 30.0–36.0)
MCV: 92.6 fl (ref 78.0–100.0)
MONO ABS: 1.2 10*3/uL — AB (ref 0.1–1.0)
Monocytes Relative: 10.4 % (ref 3.0–12.0)
Neutro Abs: 5.9 10*3/uL (ref 1.4–7.7)
Neutrophils Relative %: 49.8 % (ref 43.0–77.0)
Platelets: 242 10*3/uL (ref 150.0–400.0)
RBC: 4.59 Mil/uL (ref 4.22–5.81)
RDW: 13.6 % (ref 11.5–15.5)
WBC: 11.8 10*3/uL — AB (ref 4.0–10.5)

## 2016-12-10 LAB — PSA, MEDICARE: PSA: 0.55 ng/ml (ref 0.10–4.00)

## 2016-12-10 LAB — HEMOGLOBIN A1C: Hgb A1c MFr Bld: 6.5 % (ref 4.6–6.5)

## 2016-12-10 LAB — TSH: TSH: 3.27 u[IU]/mL (ref 0.35–4.50)

## 2016-12-10 NOTE — Telephone Encounter (Signed)
Hep C screen.

## 2016-12-10 NOTE — Progress Notes (Signed)
PCP notes:   Health maintenance:  Foot exam - if necessary, PCP will complete at next appt A1C - completed Eye exam - per pt report, eye exam in 2017 Flu vaccine - per pt, vaccine administered in Sep 2017  Abnormal screenings:   Hearing - failed Mini-Cog score: 12/20  Patient concerns:   None  Nurse concerns:  Pt needs further assessment by PCP regarding cognitive impairment.  Next PCP appt:   I reviewed health advisor's note, was available for consultation, and agree with documentation and plan. Roxy MannsMarne Tower MD   12/14/16 @ [phone]

## 2016-12-10 NOTE — Progress Notes (Signed)
Subjective:   Robert Fitzgerald is a 75 y.o. male who presents for Medicare Annual/Subsequent preventive examination.  Review of Systems:  N/A Cardiac Risk Factors include: advanced age (>3555men, 51>65 women);male gender;dyslipidemia;hypertension     Objective:    Vitals: BP 110/68 (BP Location: Left Arm, Patient Position: Sitting, Cuff Size: Normal)   Pulse (!) 49   Temp 97.8 F (36.6 C) (Oral)   Ht 5' 11.5" (1.816 m)   Wt 175 lb 8 oz (79.6 kg)   SpO2 95%   BMI 24.14 kg/m   Body mass index is 24.14 kg/m.  Tobacco History  Smoking Status  . Never Smoker  Smokeless Tobacco  . Never Used     Counseling given: No   Past Medical History:  Diagnosis Date  . Chronic back pain   . 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 Diverticulitis 1992  . ED (erectile dysfunction)   . GERD (gastroesophageal reflux disease)   . Headache, post-traumatic, chronic   . Hyperglycemia    Type 2, mild  . Hyperlipidemia    Diet controlled  . Hypertension   . Tinnitus    chronic   Past Surgical History:  Procedure Laterality Date  . BACK SURGERY     x 6- including fusion  . CIRCUMCISION    . NASAL SEPTUM SURGERY  1999  . SHOULDER SURGERY  1999   2011 for bone spur   Family History  Problem Relation Age of Onset  . Aneurysm Brother     Brain   History  Sexual Activity  . Sexual activity: Yes    Outpatient Encounter Prescriptions as of 12/10/2016  Medication Sig  . cyclobenzaprine (FLEXERIL) 10 MG tablet TAKE ONE TABLET BY MOUTH TWICE DAILY AS NEEDED FOR  BACK  PAIN  . lisinopril (PRINIVIL,ZESTRIL) 5 MG tablet Take 1 tablet (5 mg total) by mouth daily.  . sildenafil (VIAGRA) 100 MG tablet TAKE ONE TABLET BY MOUTH ONCE DAILY AS NEEDED FOR  ERECTILE  DYSFUNCTION   No facility-administered encounter medications on file as of 12/10/2016.     Activities of Daily Living In your present state of health, do you have any difficulty performing the following  activities: 12/10/2016  Hearing? Y  Vision? N  Difficulty concentrating or making decisions? N  Walking or climbing stairs? Y  Dressing or bathing? N  Doing errands, shopping? N  Preparing Food and eating ? N  Using the Toilet? N  In the past six months, have you accidently leaked urine? N  Do you have problems with loss of bowel control? N  Managing your Medications? N  Managing your Finances? Y  Housekeeping or managing your Housekeeping? N  Some recent data might be hidden    Patient Care Team: Judy PimpleMarne A Tower, MD as PCP - General Loletha CarrowEdward Hollander, MD as Consulting Physician (Ophthalmology)   Assessment:     Hearing Screening   125Hz  250Hz  500Hz  1000Hz  2000Hz  3000Hz  4000Hz  6000Hz  8000Hz   Right ear:   0 0 40  0    Left ear:   40 0 40  0    Vision Screening Comments: Last vision exam with Dr. Nelle DonHollander in Oct or Nov 2017   Exercise Activities and Dietary recommendations Current Exercise Habits: Home exercise routine, Type of exercise: walking, Time (Minutes): 60, Frequency (Times/Week): 3, Weekly Exercise (Minutes/Week): 180, Intensity: Mild, Exercise limited by: None identified  Goals    . Increase physical activity  Starting 12/10/2016, I will continue to walk at least 60 min 2-3 days per week.       Fall Risk Fall Risk  12/10/2016 11/04/2015 02/13/2013  Falls in the past year? No No Yes  Number falls in past yr: - - 2 or more  Risk for fall due to : - - Mental status change   Depression Screen PHQ 2/9 Scores 12/10/2016 11/04/2015 02/13/2013  PHQ - 2 Score 0 0 0    Cognitive Function MMSE - Mini Mental State Exam 12/10/2016  Orientation to time 3  Orientation to time comments pt was not able to provide correct year; stated it 2002  Orientation to Place 5  Registration 3  Attention/ Calculation 0  Recall 0  Recall-comments pt was unable to recall 3 of 3 words  Language- name 2 objects 0  Language- repeat 1  Language- follow 3 step command 0  Language-  follow 3 step command-comments pt was unable to follow 3 step command despite multiple cues provided by nurse and spouse  Language- read & follow direction 0  Write a sentence 0  Copy design 0  Total score 12     PLEASE NOTE: A Mini-Cog screen was completed. Maximum score is 20. A value of 0 denotes this part of Folstein MMSE was not completed or the patient failed this part of the Mini-Cog screening.   Mini-Cog Screening Orientation to Time - Max 5 pts Orientation to Place - Max 5 pts Registration - Max 3 pts Recall - Max 3 pts Language Repeat - Max 1 pts Language Follow 3 Step Command - Max 3 pts     Immunization History  Administered Date(s) Administered  . H1N1 01/17/2009  . Influenza Split 10/19/2011, 09/18/2014  . Influenza Whole 10/08/2008, 09/10/2009, 09/26/2010  . Influenza,inj,Quad PF,36+ Mos 11/04/2015  . Pneumococcal Conjugate-13 01/10/2015  . Pneumococcal Polysaccharide-23 12/27/1996, 10/08/2008  . Td 02/13/2013   Screening Tests Health Maintenance  Topic Date Due  . FOOT EXAM  01/09/2017 (Originally 01/11/2016)  . ZOSTAVAX  12/10/2026 (Originally 01/20/2002)  . HEMOGLOBIN A1C  06/10/2017  . OPHTHALMOLOGY EXAM  07/27/2017  . COLONOSCOPY  09/21/2020  . TETANUS/TDAP  02/13/2023  . INFLUENZA VACCINE  Addressed  . PNA vac Low Risk Adult  Completed      Plan:     I have personally reviewed and addressed the Medicare Annual Wellness questionnaire and have noted the following in the patient's chart:  A. Medical and social history B. Use of alcohol, tobacco or illicit drugs  C. Current medications and supplements D. Functional ability and status E.  Nutritional status F.  Physical activity G. Advance directives H. List of other physicians I.  Hospitalizations, surgeries, and ER visits in previous 12 months J.  Vitals K. Screenings to include hearing, vision, cognitive, depression L. Referrals and appointments - none  In addition, I have reviewed and  discussed with patient certain preventive protocols, quality metrics, and best practice recommendations. A written personalized care plan for preventive services as well as general preventive health recommendations were provided to patient.  See attached scanned questionnaire for additional information.   Signed,   Randa EvensLesia Beatrice Ziehm, MHA, BS, LPN Health Coach

## 2016-12-10 NOTE — Progress Notes (Signed)
Pre visit review using our clinic review tool, if applicable. No additional management support is needed unless otherwise documented below in the visit note. 

## 2016-12-10 NOTE — Patient Instructions (Signed)
[name] , Thank you for taking time to come for your Medicare Wellness Visit. I appreciate your ongoing commitment to your health goals. Please review the following plan we discussed and let me know if I can assist you in the future.   These are the goals we discussed: Goals    . Increase physical activity          Starting 12/10/2016, I will continue to walk at least 60 min 2-3 days per week.        This is a list of the screening recommended for you and due dates:  Health Maintenance  Topic Date Due  . Complete foot exam   01/09/2017*  . Shingles Vaccine  12/10/2026*  . Hemoglobin A1C  06/10/2017  . Eye exam for diabetics  07/27/2017  . Colon Cancer Screening  09/21/2020  . Tetanus Vaccine  02/13/2023  . Flu Shot  Addressed  . Pneumonia vaccines  Completed  *Topic was postponed. The date shown is not the original due date.   Preventive Care for Adults  A healthy lifestyle and preventive care can promote health and wellness. Preventive health guidelines for adults include the following key practices.  . A routine yearly physical is a good way to check with your health care provider about your health and preventive screening. It is a chance to share any concerns and updates on your health and to receive a thorough exam.  . Visit your dentist for a routine exam and preventive care every 6 months. Brush your teeth twice a day and floss once a day. Good oral hygiene prevents tooth decay and gum disease.  . The frequency of eye exams is based on your age, health, family medical history, use  of contact lenses, and other factors. Follow your health care provider's ecommendations for frequency of eye exams.  . Eat a healthy diet. Foods like vegetables, fruits, whole grains, low-fat dairy products, and lean protein foods contain the nutrients you need without too many calories. Decrease your intake of foods high in solid fats, added sugars, and salt. Eat the right amount of calories  for you. Get information about a proper diet from your health care provider, if necessary.  . Regular physical exercise is one of the most important things you can do for your health. Most adults should get at least 150 minutes of moderate-intensity exercise (any activity that increases your heart rate and causes you to sweat) each week. In addition, most adults need muscle-strengthening exercises on 2 or more days a week.  Silver Sneakers may be a benefit available to you. To determine eligibility, you may visit the website: www.silversneakers.com or contact program at (515)497-86421-2056699951 Mon-Fri between 8AM-8PM.   . Maintain a healthy weight. The body mass index (BMI) is a screening tool to identify possible weight problems. It provides an estimate of body fat based on height and weight. Your health care provider can find your BMI and can help you achieve or maintain a healthy weight.   For adults 20 years and older: ? A BMI below 18.5 is considered underweight. ? A BMI of 18.5 to 24.9 is normal. ? A BMI of 25 to 29.9 is considered overweight. ? A BMI of 30 and above is considered obese.   . Maintain normal blood lipids and cholesterol levels by exercising and minimizing your intake of saturated fat. Eat a balanced diet with plenty of fruit and vegetables. Blood tests for lipids and cholesterol should begin at age 74 and  be repeated every 5 years. If your lipid or cholesterol levels are high, you are over 50, or you are at high risk for heart disease, you may need your cholesterol levels checked more frequently. Ongoing high lipid and cholesterol levels should be treated with medicines if diet and exercise are not working.  . If you smoke, find out from your health care provider how to quit. If you do not use tobacco, please do not start.  . If you choose to drink alcohol, please do not consume more than 2 drinks per day. One drink is considered to be 12 ounces (355 mL) of beer, 5 ounces (148 mL) of  wine, or 1.5 ounces (44 mL) of liquor.  . If you are 86-24 years old, ask your health care provider if you should take aspirin to prevent strokes.  . Use sunscreen. Apply sunscreen liberally and repeatedly throughout the day. You should seek shade when your shadow is shorter than you. Protect yourself by wearing long sleeves, pants, a wide-brimmed hat, and sunglasses year round, whenever you are outdoors.  . Once a month, do a whole body skin exam, using a mirror to look at the skin on your back. Tell your health care provider of new moles, moles that have irregular borders, moles that are larger than a pencil eraser, or moles that have changed in shape or color.

## 2016-12-13 ENCOUNTER — Other Ambulatory Visit: Payer: Self-pay | Admitting: Family Medicine

## 2016-12-14 ENCOUNTER — Encounter: Payer: Self-pay | Admitting: Family Medicine

## 2016-12-14 ENCOUNTER — Ambulatory Visit (INDEPENDENT_AMBULATORY_CARE_PROVIDER_SITE_OTHER): Payer: Medicare Other | Admitting: Family Medicine

## 2016-12-14 VITALS — BP 104/58 | HR 57 | Temp 98.7°F | Ht 71.5 in | Wt 177.0 lb

## 2016-12-14 DIAGNOSIS — Z125 Encounter for screening for malignant neoplasm of prostate: Secondary | ICD-10-CM

## 2016-12-14 DIAGNOSIS — I10 Essential (primary) hypertension: Secondary | ICD-10-CM | POA: Diagnosis not present

## 2016-12-14 DIAGNOSIS — E7800 Pure hypercholesterolemia, unspecified: Secondary | ICD-10-CM

## 2016-12-14 DIAGNOSIS — R413 Other amnesia: Secondary | ICD-10-CM | POA: Diagnosis not present

## 2016-12-14 DIAGNOSIS — D72829 Elevated white blood cell count, unspecified: Secondary | ICD-10-CM

## 2016-12-14 DIAGNOSIS — I471 Supraventricular tachycardia: Secondary | ICD-10-CM

## 2016-12-14 DIAGNOSIS — Z9181 History of falling: Secondary | ICD-10-CM | POA: Insufficient documentation

## 2016-12-14 DIAGNOSIS — Z1159 Encounter for screening for other viral diseases: Secondary | ICD-10-CM

## 2016-12-14 DIAGNOSIS — R739 Hyperglycemia, unspecified: Secondary | ICD-10-CM

## 2016-12-14 MED ORDER — LISINOPRIL 5 MG PO TABS: 5.0000 mg | ORAL_TABLET | Freq: Every day | ORAL | 3 refills | Status: DC

## 2016-12-14 NOTE — Patient Instructions (Addendum)
If you are ever interested in hearing aides in the future let us know  We will sign you up for the cologuard program for colon screening   Eat less sweets  Walk as much as your back will let you (in a safe area) Here is some information on preventing falls- use your walker when you need to  Drink more water -kidney function is down a little   I recommend using a walker when you are outside the house - because of your back and bad balance (to prevent falls)

## 2016-12-14 NOTE — Progress Notes (Signed)
Subjective:    Patient ID: Robert Fitzgerald, male    DOB: [birth year], 75 y.o.   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  His wife does not think his short term memory has changes  No confusion or other problems     Wt Readings from Last 3 Encounters:  12/14/16 177 lb (80.3 kg)  12/10/16 175 lb 8 oz (79.6 kg)  06/09/16 175 lb 8 oz (79.6 kg)  up 2 lb  Has eaten a little more lately / the holidays  bmi is 24.3   Cannot get zoster vaccine -medicare does not pay   Colonoscopy 9/11- 10 year recall  Is open to doing the cologuard screening program   Had a fall in a house several days ago -lights were off/ checking on it  He was looking up and lost his balance  Tripped and could not see where he was going  Cut his R thumb and hit R forehead  No headache-or other symptoms - feels fine   Hep C screen is negative    Prostate screening  Lab Results  Component Value Date   PSA 0.55 12/10/2016   PSA 0.66 02/10/2011   PSA 0.79 10/08/2008   takes viagra for ED No problems passing urine  No nocturia  No flow problems    bp is stable today - low normal No dizziness or other symptoms /no ortho stasis  No cp or palpitations or headaches or edema  No side effects to medicines  BP Readings from Last 3 Encounters:  12/14/16 (!) 104/58  12/10/16 110/68  06/09/16 126/82     Past hx of SVT No episodes   Hx of hyperlipidemia Lab Results  Component Value Date   CHOL 178 12/10/2016   CHOL 157 06/02/2016   CHOL 175 10/23/2015   Lab Results  Component Value Date   HDL 28.20 (L) 12/10/2016   HDL 28.90 (L) 06/02/2016   HDL 31.20 (L) 10/23/2015   Lab Results  Component Value Date   LDLCALC 95 06/02/2016   LDLCALC  106 (H) 10/23/2015   LDLCALC 110 (H) 02/06/2013   Lab Results  Component Value Date   TRIG 235.0 (H) 12/10/2016   TRIG 165.0 (H) 06/02/2016   TRIG 186.0 (H) 10/23/2015   Lab Results  Component Value Date   CHOLHDL 6 12/10/2016   CHOLHDL 5 06/02/2016   CHOLHDL 6 10/23/2015   Lab Results  Component Value Date   LDLDIRECT 127.0 12/10/2016   LDLDIRECT 94.0 04/11/2015   LDLDIRECT 105.4 01/03/2015   Is limited with exercise due to chronic back pain -does her best Diet not as good  HDL remains low-this is chronic   Hx of hyperglycemia Lab Results  Component Value Date   HGBA1C 6.5 12/10/2016  tries to eat sugar free items  He loves candy bars  He does eat lots of vegetables  This is up from 6.2  Results for orders placed or performed in visit on 12/10/16  CBC with Differential/Platelet  Result Value Ref Range   WBC 11.8 (H) 4.0 - 10.5 K/uL   RBC 4.59 4.22 - 5.81 Mil/uL   Hemoglobin 14.3 13.0 - 17.0  g/dL   HCT 16.142.5 09.639.0 - 04.552.0 %   MCV 92.6 78.0 - 100.0 fl   MCHC 33.6 30.0 - 36.0 g/dL   RDW 40.913.6 81.111.5 - 91.415.5 %   Platelets 242.0 150.0 - 400.0 K/uL   Neutrophils Relative % 49.8 43.0 - 77.0 %   Lymphocytes Relative 36.8 12.0 - 46.0 %   Monocytes Relative 10.4 3.0 - 12.0 %   Eosinophils Relative 2.2 0.0 - 5.0 %   Basophils Relative 0.8 0.0 - 3.0 %   Neutro Abs 5.9 1.4 - 7.7 K/uL   Lymphs Abs 4.3 (H) 0.7 - 4.0 K/uL   Monocytes Absolute 1.2 (H) 0.1 - 1.0 K/uL   Eosinophils Absolute 0.3 0.0 - 0.7 K/uL   Basophils Absolute 0.1 0.0 - 0.1 K/uL  Comprehensive metabolic panel  Result Value Ref Range   Sodium 138 135 - 145 mEq/L   Potassium 4.7 3.5 - 5.1 mEq/L   Chloride 102 96 - 112 mEq/L   CO2 30 19 - 32 mEq/L   Glucose, Bld 99 70 - 99 mg/dL   BUN 21 6 - 23 mg/dL   Creatinine, Ser 7.821.47 0.40 - 1.50 mg/dL   Total Bilirubin 0.4 0.2 - 1.2 mg/dL   Alkaline Phosphatase 80 39 - 117 U/L   AST 18 0 - 37 U/L   ALT 16 0 - 53 U/L   Total Protein 7.1 6.0 - 8.3 g/dL   Albumin 4.4  3.5 - 5.2 g/dL   Calcium 9.8 8.4 - 95.610.5 mg/dL   GFR 21.3049.65 (L) >86.57>60.00 mL/min  Hemoglobin A1c  Result Value Ref Range   Hgb A1c MFr Bld 6.5 4.6 - 6.5 %  Lipid panel  Result Value Ref Range   Cholesterol 178 0 - 200 mg/dL   Triglycerides 846.9235.0 (H) 0.0 - 149.0 mg/dL   HDL 62.9528.20 (L) >28.41>39.00 mg/dL   VLDL 32.447.0 (H) 0.0 - 40.140.0 mg/dL   Total CHOL/HDL Ratio 6    NonHDL 149.68   TSH  Result Value Ref Range   TSH 3.27 0.35 - 4.50 uIU/mL  Hepatitis C antibody  Result Value Ref Range   HCV Ab NEGATIVE NEGATIVE  PSA, Medicare  Result Value Ref Range   PSA 0.55 0.10 - 4.00 ng/ml  LDL cholesterol, direct  Result Value Ref Range   Direct LDL 127.0 mg/dL    Wbc is always slt elevated  Wife says he needs to drink more water (does not like water)  GFR is lower this time  No urinary symptoms   Review of Systems Review of Systems  Constitutional: Negative for fever, appetite change, fatigue and unexpected weight change.  Eyes: Negative for pain and visual disturbance.  ENT - pos for chronic tinnitus /hearing loss  Respiratory: Negative for cough and shortness of breath.   Cardiovascular: Negative for cp or palpitations    Gastrointestinal: Negative for nausea, diarrhea and constipation.  Genitourinary: Negative for urgency and frequency.  Skin: Negative for pallor or rash  pos for injured R thumb from recent fall  MSK pos for chronic back pain  Neurological: Negative for weakness, light-headedness, numbness and headaches. pos for poor balance and recent fall  Hematological: Negative for adenopathy. Does not bruise/bleed easily.  Psychiatric/Behavioral: Negative for dysphoric mood. The patient is not nervous/anxious.         Objective:   Physical Exam  Constitutional: He appears well-developed and well-nourished. No distress.  Well appearing   HENT:  Head: Normocephalic and atraumatic.  Right Ear: External ear  normal.  Left Ear: External ear normal.  Nose: Nose normal.  Mouth/Throat:  Oropharynx is clear and moist.  Eyes: Conjunctivae and EOM are normal. Pupils are equal, round, and reactive to light. Right eye exhibits no discharge. Left eye exhibits no discharge. No scleral icterus.  Neck: Normal range of motion. Neck supple. No JVD present. Carotid bruit is not present. No thyromegaly present.  Cardiovascular: Normal rate, regular rhythm, normal heart sounds and intact distal pulses.  Exam reveals no gallop.   Pulmonary/Chest: Effort normal and breath sounds normal. No respiratory distress. He has no wheezes. He exhibits no tenderness.  Abdominal: Soft. Bowel sounds are normal. He exhibits no distension, no abdominal bruit and no mass. There is no tenderness.  Musculoskeletal: He exhibits no edema or tenderness.  Poor rom of  LS with some tenderness He has much difficulty getting up and down from the table   Lymphadenopathy:    He has no cervical adenopathy.  Neurological: He is alert. He has normal reflexes. No cranial nerve deficit. He exhibits normal muscle tone. Coordination normal.  Balance is poor- just standing still  Holds on for walking   Skin: Skin is warm and dry. No rash noted. No erythema. No pallor.  R thumb- scab over lateral nail - no redness or swelling   Psychiatric: He has a normal mood and affect. His mood appears not anxious. His affect is not blunt and not labile. Cognition and memory are impaired. He does not exhibit a depressed mood. He exhibits abnormal recent memory.  Baseline poor memory and slowed cognition   Tangential in speech   Confuses easily           Assessment & Plan:   Problem List Items Addressed This Visit      Cardiovascular and Mediastinum   RESOLVED: SVT (supraventricular tachycardia) (HCC)   Relevant Medications   lisinopril (PRINIVIL,ZESTRIL) 5 MG tablet   Essential hypertension - Primary    bp in fair control at this time  BP Readings from Last 1 Encounters:  12/14/16 (!) 104/58   No changes needed Disc  lifstyle change with low sodium diet and exercise  Labs reviewed        Relevant Medications   lisinopril (PRINIVIL,ZESTRIL) 5 MG tablet     Other   Prostate cancer screening    Lab Results  Component Value Date   PSA 0.55 12/10/2016   PSA 0.66 02/10/2011   PSA 0.79 10/08/2008   No symptoms       Need for hepatitis C screening test    Screen is negative      MEMORY LOSS    Rev mini cog test with AMW- about expected with 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  Wife has not noticed any changes in memory or cognition He is very tangential today as usual and confuses easily      Leukocytosis    Mild/ stable No symptoms  Continue to follow       Hyperglycemia    Lab Results  Component Value Date   HGBA1C 6.5 12/10/2016   This is up slt  Disc imp of lower carb diet Exercise as safe and tolerated      HYPERCHOLESTEROLEMIA    Disc goals for lipids and reasons to control them Rev labs with pt Rev low sat fat diet in detail  Baseline low HDL- enc exercise safely  Trig high - due to inc glucose likely  Relevant Medications   lisinopril (PRINIVIL,ZESTRIL) 5 MG tablet   History of recent fall    Long disc re: fall prev Poor balance baseline since head inj in the past  Enc strongly to use walker or other device when out of house  No signs of sig injury

## 2016-12-14 NOTE — Progress Notes (Signed)
Pre visit review using our clinic review tool, if applicable. No additional management support is needed unless otherwise documented below in the visit note. 

## 2016-12-15 NOTE — Assessment & Plan Note (Signed)
Screen is negative

## 2016-12-15 NOTE — Assessment & Plan Note (Signed)
Mild/ stable No symptoms  Continue to follow

## 2016-12-15 NOTE — Assessment & Plan Note (Signed)
Long disc re: fall prev Poor balance baseline since head inj in the past  Enc strongly to use walker or other device when out of house  No signs of sig injury

## 2016-12-15 NOTE — Assessment & Plan Note (Signed)
Lab Results  Component Value Date   PSA 0.55 12/10/2016   PSA 0.66 02/10/2011   PSA 0.79 10/08/2008   No symptoms

## 2016-12-15 NOTE — Assessment & Plan Note (Signed)
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

## 2016-12-15 NOTE — Assessment & Plan Note (Signed)
Disc goals for lipids and reasons to control them Rev labs with pt Rev low sat fat diet in detail  Baseline low HDL- enc exercise safely  Trig high - due to inc glucose likely

## 2016-12-15 NOTE — Assessment & Plan Note (Signed)
Lab Results  Component Value Date   HGBA1C 6.5 12/10/2016   This is up slt  Disc imp of lower carb diet Exercise as safe and tolerated

## 2016-12-15 NOTE — Assessment & Plan Note (Signed)
bp in fair control at this time  BP Readings from Last 1 Encounters:  12/14/16 (!) 104/58   No changes needed Disc lifstyle change with low sodium diet and exercise  Labs reviewed

## 2016-12-30 ENCOUNTER — Encounter: Payer: Self-pay | Admitting: Family Medicine

## 2016-12-30 LAB — COLOGUARD

## 2017-01-17 DIAGNOSIS — Z1211 Encounter for screening for malignant neoplasm of colon: Secondary | ICD-10-CM | POA: Diagnosis not present

## 2017-01-17 DIAGNOSIS — Z1212 Encounter for screening for malignant neoplasm of rectum: Secondary | ICD-10-CM | POA: Diagnosis not present

## 2017-01-21 LAB — COLOGUARD: COLOGUARD: NEGATIVE

## 2017-02-15 ENCOUNTER — Other Ambulatory Visit: Payer: Self-pay | Admitting: Family Medicine

## 2017-02-15 NOTE — Telephone Encounter (Signed)
Will refill electronically  

## 2017-02-15 NOTE — Telephone Encounter (Signed)
Pt has f/u on 06/14/17, last filled on 11/15/16 #180 tabs with 0 refills, please advise

## 2017-03-14 ENCOUNTER — Telehealth: Payer: Self-pay | Admitting: *Deleted

## 2017-03-14 NOTE — Telephone Encounter (Signed)
Correction, pt was calling [place]  [place] [facility]. I settled this with [facility] in Feb, will call again. Patient notified.

## 2017-03-14 NOTE — Telephone Encounter (Signed)
PT's wife called because she received a bill for her husband's cologaurd test. She said she had spoken to [place]. Please call her as soon as you can [phone].

## 2017-05-09 ENCOUNTER — Other Ambulatory Visit: Payer: Self-pay | Admitting: Family Medicine

## 2017-05-09 NOTE — Telephone Encounter (Signed)
Received refill electronically Last refill 12/13/16 #10/5 Last office visit 12/14/16

## 2017-06-08 ENCOUNTER — Telehealth: Payer: Self-pay

## 2017-06-08 NOTE — Telephone Encounter (Signed)
I spoke with Mrs Fernand ParkinsJarvis(DPR signed) pt has been confused for 1 1/2 weeks but getting worse; pt got up in middle of night to go to bathroom; Mrs Jason NestJarvis found pt sitting on coffee table in living room urinating. Pt has been taking the fuses out of the a/c. These are just 2 examples of confusion. Mrs Jason NestJarvis to take pt to ED now. Mrs Jason NestJarvis voiced understanding. FYI to Dr Milinda Antisower.

## 2017-06-08 NOTE — Telephone Encounter (Signed)
Thanks for the heads up - I'm glad she is taking him and I will watch for notes

## 2017-06-08 NOTE — Telephone Encounter (Signed)
PLEASE NOTE: All timestamps contained within this report are represented as [place] Standard Time. CONFIDENTIALTY NOTICE: This fax transmission is intended only for the addressee. It contains information that is legally privileged, confidential or otherwise protected from use or disclosure. If you are not the intended recipient, you are strictly prohibited from reviewing, disclosing, copying using or disseminating any of this information or taking any action in reliance on or regarding this information. If you have received this fax in error, please notify us immediately by telephone so that we can arrange for its return to us. Phone: [phone], Toll-Free: [phone], Fax: [phone] Page: 1 of 1 Call Id: [id number] [facility] - Client Nonclinical Telephone Record [facility] Client [facility] Day Physician Milinda Antisower, Idamae SchullerMarne - MD Contact Type Call Who Is Calling Patient / Member / Family / Caregiver Caller Name Clenton PareKathleen Grismore Caller Phone Number [phone] Patient Name Robert LangtonHerman Dean Fitzgerald Call Type Message Only Information Provided Reason for Call Request for General Office Information Initial Comment Caller states her and her husband has appts on Tuesday, husband is confused today, pulling plugs out of central air. He is off the wall. Additional Comment Provided information for the office. Call Closed By: Kendall FlackMcKenzie Tyler Martin Transaction Date/Time: 06/08/2017 9:35:04 AM (ET)

## 2017-06-10 ENCOUNTER — Other Ambulatory Visit (INDEPENDENT_AMBULATORY_CARE_PROVIDER_SITE_OTHER): Payer: Medicare Other

## 2017-06-10 DIAGNOSIS — Z125 Encounter for screening for malignant neoplasm of prostate: Secondary | ICD-10-CM

## 2017-06-10 LAB — PSA: PSA: 0.59 ng/mL (ref 0.10–4.00)

## 2017-06-14 ENCOUNTER — Encounter: Payer: Self-pay | Admitting: Family Medicine

## 2017-06-14 ENCOUNTER — Ambulatory Visit (INDEPENDENT_AMBULATORY_CARE_PROVIDER_SITE_OTHER): Payer: Medicare Other | Admitting: Family Medicine

## 2017-06-14 ENCOUNTER — Ambulatory Visit (INDEPENDENT_AMBULATORY_CARE_PROVIDER_SITE_OTHER)
Admission: RE | Admit: 2017-06-14 | Discharge: 2017-06-14 | Disposition: A | Discharge status: Home / Self Care | Payer: Medicare Other | Source: Ambulatory Visit | Attending: Family Medicine | Admitting: Family Medicine

## 2017-06-14 VITALS — BP 128/68 | HR 64 | Temp 98.0°F | Ht 71.5 in | Wt 173.5 lb

## 2017-06-14 DIAGNOSIS — E7800 Pure hypercholesterolemia, unspecified: Secondary | ICD-10-CM | POA: Diagnosis not present

## 2017-06-14 DIAGNOSIS — R05 Cough: Secondary | ICD-10-CM | POA: Insufficient documentation

## 2017-06-14 DIAGNOSIS — R410 Disorientation, unspecified: Secondary | ICD-10-CM | POA: Diagnosis not present

## 2017-06-14 DIAGNOSIS — R059 Cough, unspecified: Secondary | ICD-10-CM

## 2017-06-14 DIAGNOSIS — R413 Other amnesia: Secondary | ICD-10-CM

## 2017-06-14 DIAGNOSIS — I10 Essential (primary) hypertension: Secondary | ICD-10-CM | POA: Diagnosis not present

## 2017-06-14 DIAGNOSIS — R8290 Unspecified abnormal findings in urine: Secondary | ICD-10-CM

## 2017-06-14 DIAGNOSIS — R739 Hyperglycemia, unspecified: Secondary | ICD-10-CM

## 2017-06-14 MED ORDER — CYCLOBENZAPRINE HCL 10 MG PO TABS: ORAL_TABLET | ORAL | 0 refills | Status: DC

## 2017-06-14 NOTE — Assessment & Plan Note (Signed)
Intermittent - more in the past 2 mo and esp the last few days with an episode of sundowning  This is in addn to his baseline cog status from old brain injury  Concerning  Lab today and ua (may have to drop off urine tomorrow) cxr  If all clear-will plan on neuro ref

## 2017-06-14 NOTE — Assessment & Plan Note (Signed)
Due for A1C Diet is unchanged

## 2017-06-14 NOTE — Assessment & Plan Note (Signed)
Pt has baseline cognitive def due to old head injury Now - seems progressive for several mo  One episode of sundowning also  He was easily confused in exam , A and O times 1 , could not remember who the president was  Lab today incl esr/cbc and B12 cxr ua  If neg-ref to neuro

## 2017-06-14 NOTE — Assessment & Plan Note (Signed)
bp in fair control at this time  BP Readings from Last 1 Encounters:  06/14/17 128/68   No changes needed Disc lifstyle change with low sodium diet and exercise

## 2017-06-14 NOTE — Patient Instructions (Addendum)
Labs today for confusion and memory problems Also a chest xray for cough  Also a urinalysis to look for infection   If all of this is normal - then we will refer you to a neurologist for mental status changes   Stay hydrated in the heat and eat regular meals   If you cannot give a urine sample you can bring it in tomorrow

## 2017-06-14 NOTE — Assessment & Plan Note (Signed)
Cough sounds dry -but per wife more prod sounding at home Also some confusion/ms change lately  cxr now and adv

## 2017-06-14 NOTE — Progress Notes (Signed)
Subjective:    Patient ID: Robert Fitzgerald, male    DOB: 1941/04/06, 76 y.o.   MRN: [id number]  HPI  Here for f/u of chronic health problems   Had an episode last week of confusion He tried to go to "the outhouse" last Tuesday night  Also was pulling out fuses from the pool (day time) He talks about dog (that died 11 years ago) She was going to take him to the ED and changed his mind   Pt himself denies these episodes or confusion This is more than his usual cognitive def  No headaches  No fever or stomach ache  His wife notices a congested cough - for the past month  Some phlegm- ? Almost clear  No sob  No wheezing  No cp   No urinary symptoms No frequency or burning  No blood in urine   Says he drinks fluids  Wt is down 4 lb - has been eating lighter with the heat     Strong family hx of alz     Wt Readings from Last 3 Encounters:  06/14/17 173 lb 8 oz (78.7 kg)  12/14/16 177 lb (80.3 kg)  12/10/16 175 lb 8 oz (79.6 kg)   bmi 23.8   His labs were not drawn    bp is stable today  No cp or palpitations or headaches or edema  No side effects to medicines  BP Readings from Last 3 Encounters:  06/14/17 128/68  12/14/16 (!) 104/58  12/10/16 110/68      Hx of hyperlipidemia Lab Results  Component Value Date   CHOL 178 12/10/2016   HDL 28.20 (L) 12/10/2016   LDLCALC 95 06/02/2016   LDLDIRECT 127.0 12/10/2016   TRIG 235.0 (H) 12/10/2016   CHOLHDL 6 12/10/2016    Hx of hyperglycemia Lab Results  Component Value Date   HGBA1C 6.5 12/10/2016  due for A1C Eye exam due in aug   Lab Results  Component Value Date   PSA 0.59 06/10/2017   PSA 0.55 12/10/2016   PSA 0.66 02/10/2011    Pt says it is June 12, Wednesday  Cannot remember my name or the name of the practice  Takes a while to remember the name of the president-had to be prompted   Patient Active Problem List   Diagnosis Date Noted  . Confusion 06/14/2017  . Short-term memory loss  06/14/2017  . Cough 06/14/2017  . History of recent fall 12/14/2016  . Need for hepatitis C screening test 12/10/2016  . Prostate cancer screening 12/09/2016  . Overuse of medication 05/04/2016  . Medicare annual wellness visit, initial 11/04/2015  . Leukocytosis 10/09/2014  . Hyperglycemia 08/11/2011  . MEMORY LOSS 07/11/2008  . Essential hypertension 02/01/2008  . Chronic back pain 02/01/2008  . HYPERCHOLESTEROLEMIA 01/31/2008  . ERECTILE DYSFUNCTION 01/31/2008  . TINNITUS, CHRONIC 01/31/2008  . HIATAL HERNIA WITH REFLUX 01/31/2008  . HEAD TRAUMA, CLOSED 01/31/2008   Past Medical History:  Diagnosis Date  . Chronic back pain   . 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 Diverticulitis 1992  . ED (erectile dysfunction)   . GERD (gastroesophageal reflux disease)   . Headache, post-traumatic, chronic   . Hyperglycemia    Type 2, mild  . Hyperlipidemia    Diet controlled  . Hypertension   . Tinnitus    chronic   Past Surgical History:  Procedure Laterality Date  . BACK SURGERY  x 6- including fusion  . CIRCUMCISION    . NASAL SEPTUM SURGERY  1999  . SHOULDER SURGERY  1999   2011 for bone spur   Social History  Substance Use Topics  . Smoking status: Never Smoker  . Smokeless tobacco: Never Used  . Alcohol use No   Family History  Problem Relation Age of Onset  . Aneurysm Brother        Brain   Allergies  Allergen Reactions  . Aspirin     REACTION: GI upset  . Codeine Sulfate     REACTION: nausea  . Naproxen Sodium     REACTION: GI upset   Current Outpatient Prescriptions on File Prior to Visit  Medication Sig Dispense Refill  . lisinopril (PRINIVIL,ZESTRIL) 5 MG tablet Take 1 tablet (5 mg total) by mouth daily. 90 tablet 3  . sildenafil (VIAGRA) 100 MG tablet TAKE 1 TABLET BY MOUTH ONCE DAILY AS NEEDED FOR ERECTILE DYSFUNCTION. 10 tablet 3   No current facility-administered medications on file prior to visit.     Review of  Systems Review of Systems  Constitutional: Negative for fever, appetite change, and unexpected weight change.  Eyes: Negative for pain and visual disturbance.  Respiratory: Negative for wheeze and shortness of breath.   Cardiovascular: Negative for cp or palpitations    Gastrointestinal: Negative for nausea, diarrhea and constipation.  Genitourinary: Negative for urgency and frequency. neg for dysuria or hematuria  Skin: Negative for pallor or rash   Neurological: Negative for weakness, light-headedness, numbness and headaches.  Hematological: Negative for adenopathy. Does not bruise/bleed easily.  Psychiatric/Behavioral: Negative for dysphoric mood. Pos for inc short term memory loss with episodes of confusion and sundowning .         Objective:   Physical Exam  Constitutional: He is oriented to person, place, and time. He appears well-developed and well-nourished. No distress.  Well appearing - more confused than baseline but not agitated   HENT:  Head: Normocephalic and atraumatic.  Right Ear: External ear normal.  Left Ear: External ear normal.  Nose: Nose normal.  Mouth/Throat: Oropharynx is clear and moist. No oropharyngeal exudate.  No sinus tenderness No temporal tenderness  No TMJ tenderness  Eyes: Conjunctivae and EOM are normal. Pupils are equal, round, and reactive to light. Right eye exhibits no discharge. Left eye exhibits no discharge. No scleral icterus.  No nystagmus  Neck: Normal range of motion and full passive range of motion without pain. Neck supple. No JVD present. Carotid bruit is not present. No tracheal deviation present. No thyromegaly present.  Cardiovascular: Normal rate, regular rhythm and normal heart sounds.   No murmur heard. Pulmonary/Chest: Effort normal and breath sounds normal. No respiratory distress. He has no wheezes. He has no rales.  Abdominal: Soft. Bowel sounds are normal. He exhibits no distension and no mass. There is no tenderness.   Musculoskeletal: He exhibits no edema or tenderness.  Poor rom LS and slow gait  Lymphadenopathy:    He has no cervical adenopathy.  Neurological: He is alert and oriented to person, place, and time. He has normal strength and normal reflexes. He displays no atrophy and no tremor. No cranial nerve deficit or sensory deficit. He exhibits normal muscle tone. He displays a negative Romberg sign. Coordination and gait normal.  No focal cerebellar signs   Skin: Skin is warm and dry. No rash noted. No pallor.  Psychiatric: He has a normal mood and affect. His behavior is normal. Thought content  normal. His speech is tangential. He is not agitated, not slowed and not withdrawn. Thought content is not paranoid. Cognition and memory are impaired. He expresses no homicidal and no suicidal ideation. He exhibits abnormal recent memory.  Pleasant [employer]swers questions and tangential in thought  Easily confused  A and O times 1  Cannot remember who the president is  Cannot follow directions well   Not seemingly depressed or anxious           Assessment & Plan:   Problem List Items Addressed This Visit      Cardiovascular and Mediastinum   Essential hypertension    bp in fair control at this time  BP Readings from Last 1 Encounters:  06/14/17 128/68   No changes needed Disc lifstyle change with low sodium diet and exercise        Relevant Orders   CBC with Differential/Platelet   Comprehensive metabolic panel   Lipid panel   TSH     Nervous and Auditory   Confusion - Primary    Intermittent - more in the past 2 mo and esp the last few days with an episode of sundowning  This is in addn to his baseline cog status from old brain injury  Concerning  Lab today and ua (may have to drop off urine tomorrow) cxr  If all clear-will plan on neuro ref          Other   Cough    Cough sounds dry -but per wife more prod sounding at home Also some confusion/ms change lately  cxr now and  adv      Relevant Orders   DG Chest 2 View   HYPERCHOLESTEROLEMIA    Lab today  Diet control Disc goals for lipids and reasons to control them Rev labs with pt  (last check) Rev low sat fat diet in detail       Hyperglycemia    Due for A1C Diet is unchanged      Relevant Orders   Hemoglobin A1c   Short-term memory loss    Pt has baseline cognitive def due to old head injury Now - seems progressive for several mo  One episode of sundowning also  He was easily confused in exam , A and O times 1 , could not remember who the president was  Lab today incl esr/cbc and B12 cxr ua  If neg-ref to neuro      Relevant Orders   TSH   Vitamin B12   Sedimentation Rate

## 2017-06-14 NOTE — Assessment & Plan Note (Signed)
Lab today  Diet control Disc goals for lipids and reasons to control them Rev labs with pt  (last check) Rev low sat fat diet in detail

## 2017-06-15 DIAGNOSIS — R410 Disorientation, unspecified: Secondary | ICD-10-CM | POA: Diagnosis not present

## 2017-06-15 DIAGNOSIS — R8290 Unspecified abnormal findings in urine: Secondary | ICD-10-CM | POA: Diagnosis not present

## 2017-06-15 LAB — POC URINALSYSI DIPSTICK (AUTOMATED)
GLUCOSE UA: NEGATIVE
Ketones, UA: NEGATIVE
LEUKOCYTES UA: NEGATIVE
NITRITE UA: NEGATIVE
PH UA: 6 (ref 5.0–8.0)
Protein, UA: NEGATIVE
Spec Grav, UA: >=1.03 — AB (ref 1.010–1.025)
UROBILINOGEN UA: 0.2 U/dL

## 2017-06-15 LAB — CBC WITH DIFFERENTIAL/PLATELET
BASOS ABS: 0.1 10*3/uL (ref 0.0–0.1)
BASOS PCT: 1 % (ref 0.0–3.0)
EOS ABS: 0.3 10*3/uL (ref 0.0–0.7)
Eosinophils Relative: 2.8 % (ref 0.0–5.0)
HEMATOCRIT: 41.5 % (ref 39.0–52.0)
Hemoglobin: 14 g/dL (ref 13.0–17.0)
LYMPHS ABS: 4.2 10*3/uL — AB (ref 0.7–4.0)
Lymphocytes Relative: 39.7 % (ref 12.0–46.0)
MCHC: 33.8 g/dL (ref 30.0–36.0)
MCV: 92.8 fl (ref 78.0–100.0)
Monocytes Absolute: 1.2 10*3/uL — ABNORMAL HIGH (ref 0.1–1.0)
Monocytes Relative: 11.5 % (ref 3.0–12.0)
NEUTROS ABS: 4.8 10*3/uL (ref 1.4–7.7)
NEUTROS PCT: 45 % (ref 43.0–77.0)
Platelets: 212 10*3/uL (ref 150.0–400.0)
RBC: 4.47 Mil/uL (ref 4.22–5.81)
RDW: 13.7 % (ref 11.5–15.5)
WBC: 10.6 10*3/uL — ABNORMAL HIGH (ref 4.0–10.5)

## 2017-06-15 LAB — LIPID PANEL
Cholesterol: 177 mg/dL (ref 0–200)
HDL: 31.1 mg/dL — AB (ref >39.00)
NONHDL: 146.27
TRIGLYCERIDES: 290 mg/dL — AB (ref 0.0–149.0)
Total CHOL/HDL Ratio: 6
VLDL: 58 mg/dL — ABNORMAL HIGH (ref 0.0–40.0)

## 2017-06-15 LAB — COMPREHENSIVE METABOLIC PANEL
ALT: 12 U/L (ref 0–53)
AST: 17 U/L (ref 0–37)
Albumin: 4.6 g/dL (ref 3.5–5.2)
Alkaline Phosphatase: 81 U/L (ref 39–117)
BILIRUBIN TOTAL: 0.4 mg/dL (ref 0.2–1.2)
BUN: 18 mg/dL (ref 6–23)
CALCIUM: 9.9 mg/dL (ref 8.4–10.5)
CHLORIDE: 105 meq/L (ref 96–112)
CO2: 29 meq/L (ref 19–32)
Creatinine, Ser: 1.24 mg/dL (ref 0.40–1.50)
GFR: 60.34 mL/min (ref >60.00)
GLUCOSE: 100 mg/dL — AB (ref 70–99)
Potassium: 4.6 mEq/L (ref 3.5–5.1)
Sodium: 141 mEq/L (ref 135–145)
Total Protein: 7.6 g/dL (ref 6.0–8.3)

## 2017-06-15 LAB — HEMOGLOBIN A1C: Hgb A1c MFr Bld: 6.5 % (ref 4.6–6.5)

## 2017-06-15 LAB — SEDIMENTATION RATE: Sed Rate: 20 mm/hr (ref 0–20)

## 2017-06-15 LAB — LDL CHOLESTEROL, DIRECT: Direct LDL: 112 mg/dL

## 2017-06-15 LAB — VITAMIN B12: Vitamin B-12: 1142 pg/mL — ABNORMAL HIGH (ref 211–911)

## 2017-06-15 LAB — TSH: TSH: 2 u[IU]/mL (ref 0.35–4.50)

## 2017-06-15 NOTE — Addendum Note (Signed)
Addended by: Shon MilletWATLINGTON, Jorgia Manthei M on: 06/15/2017 03:00 PM   Modules accepted: Orders

## 2017-06-15 NOTE — Addendum Note (Signed)
Addended by: Shon MilletWATLINGTON, Nicky Kras M on: 06/15/2017 04:10 PM   Modules accepted: Orders

## 2017-06-16 ENCOUNTER — Telehealth: Payer: Self-pay | Admitting: Family Medicine

## 2017-06-16 DIAGNOSIS — R410 Disorientation, unspecified: Secondary | ICD-10-CM

## 2017-06-16 DIAGNOSIS — R413 Other amnesia: Secondary | ICD-10-CM

## 2017-06-16 LAB — URINE CULTURE: Organism ID, Bacteria: NO GROWTH

## 2017-06-16 NOTE — Telephone Encounter (Signed)
-----   Message from Shon MilletShapale M [place], [place] sent at 06/16/2017 12:24 PM EDT ----- Spouse notified of lab results/ urinalysis results and Dr. Royden Purlower's comments. Pt does want to proceed with referral to neurology, please put the referral in and I advise [facility]

## 2017-06-16 NOTE — Telephone Encounter (Signed)
Referral done Will route to [facility]  

## 2017-06-17 NOTE — Telephone Encounter (Signed)
Placed on [facility] WQ Patients wife aware that they will call directly to schedule.

## 2017-08-09 ENCOUNTER — Ambulatory Visit (INDEPENDENT_AMBULATORY_CARE_PROVIDER_SITE_OTHER): Payer: Medicare Other | Admitting: Neurology

## 2017-08-09 ENCOUNTER — Encounter: Payer: Self-pay | Admitting: Neurology

## 2017-08-09 VITALS — BP 140/86 | HR 67 | Ht 71.5 in | Wt 172.0 lb

## 2017-08-09 DIAGNOSIS — R413 Other amnesia: Secondary | ICD-10-CM

## 2017-08-09 NOTE — Patient Instructions (Signed)
   We will check MRI of the brain. If you desire to go on a medication for memory, please call our office.

## 2017-08-09 NOTE — Progress Notes (Signed)
Reason for visit: Memory disturbance  Referring physician: Dr. Asa Saunas Robert Fitzgerald is a 76 y.o. male  History of present illness:  Robert Fitzgerald is a 76 year old right-handed white male with a history of a gradually progressive memory disturbance for 4 or 5 years according to the wife. The patient himself has noted some mild changes in memory just over the last year. He has some difficulty remembering recent events. He denies any issues remembering names for people. The patient does repeat himself at times. He is operating a motor vehicle, he claims he does not have any problems with directions with driving or any safety issues with driving. The patient handles his own medications and appointments without difficulty. The wife does the finances, she always has. The patient denies any troubles with balance, and he denies any numbness or weakness of the face, arms, or legs. He denies headaches or dizziness or difficulty controlling the bowels or the bladder. At times, the patient may have some periods of confusion that are more prominent than his baseline. He has had a recent panel of blood work done, the vitamin B12 level was okay. He is sent to this office for an evaluation.   Past Medical History:  Diagnosis Date  . Chronic back pain   . 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 Diverticulitis 1992  . ED (erectile dysfunction)   . GERD (gastroesophageal reflux disease)   . Headache, post-traumatic, chronic   . Hyperglycemia    Type 2, mild  . Hyperlipidemia    Diet controlled  . Hypertension   . Tinnitus    chronic    Past Surgical History:  Procedure Laterality Date  . BACK SURGERY     x 6- including fusion  . CIRCUMCISION    . NASAL SEPTUM SURGERY  1999  . SHOULDER SURGERY  1999   2011 for bone spur    Family History  Problem Relation Age of Onset  . Aneurysm Brother        Brain    Social history:  reports that he has never smoked. He has never  used smokeless tobacco. He reports that he does not drink alcohol or use drugs.  Medications:  Prior to Admission medications   Medication Sig Start Date End Date Taking? Authorizing Provider  cyclobenzaprine (FLEXERIL) 10 MG tablet TAKE ONE TABLET BY MOUTH TWICE DAILY AS NEEDED FOR  BACK  PAIN 06/14/17  Yes Tower, Marne A, MD  lisinopril (PRINIVIL,ZESTRIL) 5 MG tablet Take 1 tablet (5 mg total) by mouth daily. 12/14/16  Yes Tower, Audrie Gallus, MD  sildenafil (VIAGRA) 100 MG tablet TAKE 1 TABLET BY MOUTH ONCE DAILY AS NEEDED FOR ERECTILE DYSFUNCTION. 05/09/17  Yes Tower, Audrie Gallus, MD      Allergies  Allergen Reactions  . Aspirin     REACTION: GI upset  . Codeine Sulfate     REACTION: nausea  . Naproxen Sodium     REACTION: GI upset    ROS:  Out of a complete 14 system review of symptoms, the patient complains only of the following symptoms, and all other reviewed systems are negative.  Memory disturbance  Blood pressure 140/86, pulse 67, height 5' 11.5" (1.816 m), weight 172 lb (78 kg).  Physical Exam  General: The patient is alert and cooperative at the time of the examination.  Eyes: Pupils are equal, round, and reactive to light. Discs are flat bilaterally.  Neck: The neck is  supple, no carotid bruits are noted.  Respiratory: The respiratory examination is clear.  Cardiovascular: The cardiovascular examination reveals a regular rate and rhythm, no obvious murmurs or rubs are noted.  Skin: Extremities are without significant edema.  Neurologic Exam  Mental status: The patient is alert and oriented x 2 at the time of the examination (not oriented to date). The Mini-Mental Status Examination done today shows a total score of 21/30.  Cranial nerves: Facial symmetry is present. There is good sensation of the face to pinprick and soft touch bilaterally. The strength of the facial muscles and the muscles to head turning and shoulder shrug are normal bilaterally. Speech is well  enunciated, no aphasia or dysarthria is noted. Extraocular movements are full. Visual fields are full. The tongue is midline, and the patient has symmetric elevation of the soft palate. No obvious hearing deficits are noted.  Motor: The motor testing reveals 5 over 5 strength of all 4 extremities. Good symmetric motor tone is noted throughout.  Sensory: Sensory testing is intact to pinprick, soft touch, vibration sensation, and position sense on all 4 extremities, with exception of some decrease in position sense of the right foot. No evidence of extinction is noted.  Coordination: Cerebellar testing reveals good finger-nose-finger and heel-to-shin bilaterally.  Gait and station: Gait is normal. Tandem gait is slightly unsteady. Romberg is negative. No drift is seen.  Reflexes: Deep tendon reflexes are symmetric and normal bilaterally. Toes are downgoing bilaterally.   Assessment/Plan:  1. Progressive memory disorder  The patient does have evidence of a memory disorder on Mini-Mental Status Examination testing today. The patient has had blood work done, he will have MRI brain evaluation. I have discussed the possibility of starting a medication for memory, he wants to "think about it" and he will call me if he decides he wishes to go on a drug. Otherwise, he will follow-up in 6 months for an evaluation.   Robert Palau. Keith Willis MD 08/09/2017 3:01 PM  [facility] [address] [place], [place] [id number]  Phone [phone] Fax [phone]

## 2017-08-23 ENCOUNTER — Ambulatory Visit
Admission: RE | Admit: 2017-08-23 | Discharge: 2017-08-23 | Disposition: A | Discharge status: Home / Self Care | Payer: Medicare Other | Source: Ambulatory Visit | Attending: Neurology | Admitting: Neurology

## 2017-08-23 ENCOUNTER — Other Ambulatory Visit: Payer: Self-pay | Admitting: Family Medicine

## 2017-08-23 DIAGNOSIS — R413 Other amnesia: Secondary | ICD-10-CM

## 2017-08-25 ENCOUNTER — Telehealth: Payer: Self-pay | Admitting: Neurology

## 2017-08-25 NOTE — Telephone Encounter (Signed)
I called the patient. MRI of the brain shows moderate to severe level of small vessel ischemic changes in the brainstem and white matter of the hemispheres. This likely in part explains some of the memory issues the patient is complaining of. The patient is allergic to aspirin, would recommend going on Plavix. If he is amenable to this, I will call in a prescription.   MRI brain 08/24/17:  IMPRESSION: Abnormal MRI scan of the brain showing multiple remote age lacunar infarcts involving bilateral basal ganglia and pons along with moderate changes of chronic microvascular ischemia and mild degree of generalized cerebral atrophy. No acute abnormalities are noted.

## 2017-09-16 ENCOUNTER — Inpatient Hospital Stay (HOSPITAL_COMMUNITY): Payer: Medicare Other

## 2017-09-16 ENCOUNTER — Inpatient Hospital Stay (HOSPITAL_COMMUNITY): Payer: Medicare Other | Admitting: Certified Registered Nurse Anesthetist

## 2017-09-16 ENCOUNTER — Encounter (HOSPITAL_COMMUNITY)
Admission: EM | Disposition: A | Discharge status: Skilled Nursing Facility | Payer: Self-pay | Source: Home / Self Care | Attending: Internal Medicine

## 2017-09-16 ENCOUNTER — Emergency Department (HOSPITAL_COMMUNITY): Payer: Medicare Other

## 2017-09-16 ENCOUNTER — Encounter (HOSPITAL_COMMUNITY): Payer: Self-pay | Admitting: Emergency Medicine

## 2017-09-16 ENCOUNTER — Inpatient Hospital Stay (HOSPITAL_COMMUNITY)
Admission: EM | Admit: 2017-09-16 | Discharge: 2017-09-25 | DRG: 480 | Disposition: A | Discharge status: Skilled Nursing Facility | Payer: Medicare Other | Attending: Internal Medicine | Admitting: Internal Medicine

## 2017-09-16 DIAGNOSIS — S72001D Fracture of unspecified part of neck of right femur, subsequent encounter for closed fracture with routine healing: Secondary | ICD-10-CM | POA: Diagnosis not present

## 2017-09-16 DIAGNOSIS — I493 Ventricular premature depolarization: Secondary | ICD-10-CM | POA: Diagnosis present

## 2017-09-16 DIAGNOSIS — I471 Supraventricular tachycardia, unspecified: Secondary | ICD-10-CM | POA: Diagnosis present

## 2017-09-16 DIAGNOSIS — T148XXA Other injury of unspecified body region, initial encounter: Secondary | ICD-10-CM | POA: Diagnosis not present

## 2017-09-16 DIAGNOSIS — I110 Hypertensive heart disease with heart failure: Secondary | ICD-10-CM | POA: Diagnosis present

## 2017-09-16 DIAGNOSIS — R4182 Altered mental status, unspecified: Secondary | ICD-10-CM | POA: Diagnosis not present

## 2017-09-16 DIAGNOSIS — Z781 Physical restraint status: Secondary | ICD-10-CM

## 2017-09-16 DIAGNOSIS — Z87820 Personal history of traumatic brain injury: Secondary | ICD-10-CM | POA: Diagnosis not present

## 2017-09-16 DIAGNOSIS — R278 Other lack of coordination: Secondary | ICD-10-CM | POA: Diagnosis not present

## 2017-09-16 DIAGNOSIS — Z885 Allergy status to narcotic agent status: Secondary | ICD-10-CM | POA: Diagnosis not present

## 2017-09-16 DIAGNOSIS — Z419 Encounter for procedure for purposes other than remedying health state, unspecified: Secondary | ICD-10-CM

## 2017-09-16 DIAGNOSIS — I429 Cardiomyopathy, unspecified: Secondary | ICD-10-CM | POA: Diagnosis not present

## 2017-09-16 DIAGNOSIS — F439 Reaction to severe stress, unspecified: Secondary | ICD-10-CM | POA: Diagnosis present

## 2017-09-16 DIAGNOSIS — S299XXA Unspecified injury of thorax, initial encounter: Secondary | ICD-10-CM | POA: Diagnosis not present

## 2017-09-16 DIAGNOSIS — I350 Nonrheumatic aortic (valve) stenosis: Secondary | ICD-10-CM | POA: Diagnosis not present

## 2017-09-16 DIAGNOSIS — R2689 Other abnormalities of gait and mobility: Secondary | ICD-10-CM | POA: Diagnosis not present

## 2017-09-16 DIAGNOSIS — S72121A Displaced fracture of lesser trochanter of right femur, initial encounter for closed fracture: Secondary | ICD-10-CM | POA: Diagnosis not present

## 2017-09-16 DIAGNOSIS — Z125 Encounter for screening for malignant neoplasm of prostate: Secondary | ICD-10-CM

## 2017-09-16 DIAGNOSIS — I5042 Chronic combined systolic (congestive) and diastolic (congestive) heart failure: Secondary | ICD-10-CM | POA: Diagnosis not present

## 2017-09-16 DIAGNOSIS — D62 Acute posthemorrhagic anemia: Secondary | ICD-10-CM | POA: Diagnosis not present

## 2017-09-16 DIAGNOSIS — W19XXXA Unspecified fall, initial encounter: Secondary | ICD-10-CM

## 2017-09-16 DIAGNOSIS — R451 Restlessness and agitation: Secondary | ICD-10-CM | POA: Diagnosis not present

## 2017-09-16 DIAGNOSIS — K219 Gastro-esophageal reflux disease without esophagitis: Secondary | ICD-10-CM | POA: Diagnosis not present

## 2017-09-16 DIAGNOSIS — Z886 Allergy status to analgesic agent status: Secondary | ICD-10-CM | POA: Diagnosis not present

## 2017-09-16 DIAGNOSIS — I10 Essential (primary) hypertension: Secondary | ICD-10-CM | POA: Diagnosis not present

## 2017-09-16 DIAGNOSIS — Z4789 Encounter for other orthopedic aftercare: Secondary | ICD-10-CM | POA: Diagnosis not present

## 2017-09-16 DIAGNOSIS — Y92008 Other place in unspecified non-institutional (private) residence as the place of occurrence of the external cause: Secondary | ICD-10-CM

## 2017-09-16 DIAGNOSIS — F0150 Vascular dementia without behavioral disturbance: Secondary | ICD-10-CM | POA: Diagnosis present

## 2017-09-16 DIAGNOSIS — S72001A Fracture of unspecified part of neck of right femur, initial encounter for closed fracture: Secondary | ICD-10-CM | POA: Diagnosis not present

## 2017-09-16 DIAGNOSIS — E876 Hypokalemia: Secondary | ICD-10-CM | POA: Diagnosis present

## 2017-09-16 DIAGNOSIS — R410 Disorientation, unspecified: Secondary | ICD-10-CM | POA: Diagnosis not present

## 2017-09-16 DIAGNOSIS — R1312 Dysphagia, oropharyngeal phase: Secondary | ICD-10-CM | POA: Diagnosis not present

## 2017-09-16 DIAGNOSIS — R1310 Dysphagia, unspecified: Secondary | ICD-10-CM | POA: Diagnosis not present

## 2017-09-16 DIAGNOSIS — D72829 Elevated white blood cell count, unspecified: Secondary | ICD-10-CM | POA: Diagnosis present

## 2017-09-16 DIAGNOSIS — E7800 Pure hypercholesterolemia, unspecified: Secondary | ICD-10-CM | POA: Diagnosis not present

## 2017-09-16 DIAGNOSIS — Z7901 Long term (current) use of anticoagulants: Secondary | ICD-10-CM | POA: Diagnosis not present

## 2017-09-16 DIAGNOSIS — Z79899 Other long term (current) drug therapy: Secondary | ICD-10-CM

## 2017-09-16 DIAGNOSIS — Z6828 Body mass index (BMI) 28.0-28.9, adult: Secondary | ICD-10-CM | POA: Diagnosis not present

## 2017-09-16 DIAGNOSIS — S79911A Unspecified injury of right hip, initial encounter: Secondary | ICD-10-CM | POA: Diagnosis not present

## 2017-09-16 DIAGNOSIS — E860 Dehydration: Secondary | ICD-10-CM | POA: Diagnosis not present

## 2017-09-16 DIAGNOSIS — S72141A Displaced intertrochanteric fracture of right femur, initial encounter for closed fracture: Principal | ICD-10-CM | POA: Diagnosis present

## 2017-09-16 DIAGNOSIS — E43 Unspecified severe protein-calorie malnutrition: Secondary | ICD-10-CM | POA: Diagnosis present

## 2017-09-16 DIAGNOSIS — S92153A Displaced avulsion fracture (chip fracture) of unspecified talus, initial encounter for closed fracture: Secondary | ICD-10-CM | POA: Diagnosis not present

## 2017-09-16 DIAGNOSIS — Z981 Arthrodesis status: Secondary | ICD-10-CM

## 2017-09-16 DIAGNOSIS — F05 Delirium due to known physiological condition: Secondary | ICD-10-CM | POA: Diagnosis not present

## 2017-09-16 DIAGNOSIS — D500 Iron deficiency anemia secondary to blood loss (chronic): Secondary | ICD-10-CM | POA: Diagnosis not present

## 2017-09-16 DIAGNOSIS — M25551 Pain in right hip: Secondary | ICD-10-CM | POA: Diagnosis not present

## 2017-09-16 DIAGNOSIS — W010XXA Fall on same level from slipping, tripping and stumbling without subsequent striking against object, initial encounter: Secondary | ICD-10-CM | POA: Diagnosis present

## 2017-09-16 DIAGNOSIS — M79604 Pain in right leg: Secondary | ICD-10-CM | POA: Diagnosis not present

## 2017-09-16 DIAGNOSIS — S42412A Displaced simple supracondylar fracture without intercondylar fracture of left humerus, initial encounter for closed fracture: Secondary | ICD-10-CM | POA: Diagnosis not present

## 2017-09-16 DIAGNOSIS — Z8781 Personal history of (healed) traumatic fracture: Secondary | ICD-10-CM | POA: Diagnosis present

## 2017-09-16 DIAGNOSIS — M6281 Muscle weakness (generalized): Secondary | ICD-10-CM | POA: Diagnosis not present

## 2017-09-16 DIAGNOSIS — R41841 Cognitive communication deficit: Secondary | ICD-10-CM | POA: Diagnosis not present

## 2017-09-16 DIAGNOSIS — S42415A Nondisplaced simple supracondylar fracture without intercondylar fracture of left humerus, initial encounter for closed fracture: Secondary | ICD-10-CM | POA: Diagnosis not present

## 2017-09-16 DIAGNOSIS — R413 Other amnesia: Secondary | ICD-10-CM | POA: Diagnosis not present

## 2017-09-16 DIAGNOSIS — K449 Diaphragmatic hernia without obstruction or gangrene: Secondary | ICD-10-CM | POA: Diagnosis not present

## 2017-09-16 DIAGNOSIS — S7291XA Unspecified fracture of right femur, initial encounter for closed fracture: Secondary | ICD-10-CM | POA: Diagnosis not present

## 2017-09-16 HISTORY — PX: FEMUR IM NAIL: SHX1597

## 2017-09-16 LAB — BASIC METABOLIC PANEL
ANION GAP: 9 (ref 5–15)
BUN: 14 mg/dL (ref 6–20)
CALCIUM: 9 mg/dL (ref 8.9–10.3)
CO2: 28 mmol/L (ref 22–32)
Chloride: 103 mmol/L (ref 101–111)
Creatinine, Ser: 1.2 mg/dL (ref 0.61–1.24)
GFR calc Af Amer: >60 mL/min (ref >60)
GFR calc non Af Amer: 57 mL/min — ABNORMAL LOW (ref >60)
GLUCOSE: 143 mg/dL — AB (ref 65–99)
Potassium: 3.7 mmol/L (ref 3.5–5.1)
Sodium: 140 mmol/L (ref 135–145)

## 2017-09-16 LAB — CBC WITH DIFFERENTIAL/PLATELET
BASOS PCT: 1 %
Basophils Absolute: 0.1 10*3/uL (ref 0.0–0.1)
Eosinophils Absolute: 0.4 10*3/uL (ref 0.0–0.7)
Eosinophils Relative: 4 %
HEMATOCRIT: 41.5 % (ref 39.0–52.0)
Hemoglobin: 13.8 g/dL (ref 13.0–17.0)
LYMPHS PCT: 32 %
Lymphs Abs: 3.1 10*3/uL (ref 0.7–4.0)
MCH: 31 pg (ref 26.0–34.0)
MCHC: 33.3 g/dL (ref 30.0–36.0)
MCV: 93.3 fL (ref 78.0–100.0)
MONO ABS: 1 10*3/uL (ref 0.1–1.0)
MONOS PCT: 10 %
NEUTROS ABS: 5.2 10*3/uL (ref 1.7–7.7)
Neutrophils Relative %: 53 %
Platelets: 231 10*3/uL (ref 150–400)
RBC: 4.45 MIL/uL (ref 4.22–5.81)
RDW: 13.9 % (ref 11.5–15.5)
WBC: 9.8 10*3/uL (ref 4.0–10.5)

## 2017-09-16 LAB — URINALYSIS, COMPLETE (UACMP) WITH MICROSCOPIC
Bilirubin Urine: NEGATIVE
Glucose, UA: NEGATIVE mg/dL
Ketones, ur: 5 mg/dL — AB
LEUKOCYTES UA: NEGATIVE
NITRITE: NEGATIVE
PROTEIN: NEGATIVE mg/dL
SPECIFIC GRAVITY, URINE: 1.014 (ref 1.005–1.030)
SQUAMOUS EPITHELIAL / LPF: NONE SEEN
pH: 5 (ref 5.0–8.0)

## 2017-09-16 LAB — GLUCOSE, CAPILLARY: Glucose-Capillary: 182 mg/dL — ABNORMAL HIGH (ref 65–99)

## 2017-09-16 LAB — TYPE AND SCREEN
ABO/RH(D): O POS
Antibody Screen: NEGATIVE

## 2017-09-16 LAB — PROTIME-INR
INR: 0.97
Prothrombin Time: 12.8 seconds (ref 11.4–15.2)

## 2017-09-16 LAB — ABO/RH: ABO/RH(D): O POS

## 2017-09-16 SURGERY — INSERTION, INTRAMEDULLARY ROD, FEMUR
Anesthesia: General | Laterality: Right

## 2017-09-16 MED ORDER — MENTHOL 3 MG MT LOZG
1.0000 | LOZENGE | OROMUCOSAL | Status: DC | PRN
  Filled 2017-09-16: qty 9

## 2017-09-16 MED ORDER — CEFAZOLIN SODIUM-DEXTROSE 2-4 GM/100ML-% IV SOLN
INTRAVENOUS | Status: AC
Start: 2017-09-16 — End: 2017-09-16
  Filled 2017-09-16: qty 100

## 2017-09-16 MED ORDER — POLYETHYLENE GLYCOL 3350 17 G PO PACK: 17.0000 g | PACK | Freq: Every day | ORAL | Status: DC | PRN

## 2017-09-16 MED ORDER — FENTANYL CITRATE (PF) 100 MCG/2ML IJ SOLN
INTRAMUSCULAR | Status: AC
  Filled 2017-09-16: qty 2

## 2017-09-16 MED ORDER — MORPHINE SULFATE (PF) 4 MG/ML IV SOLN
0.5000 mg | INTRAVENOUS | Status: DC | PRN
  Administered 2017-09-16: 0.5 mg via INTRAVENOUS
  Administered 2017-09-16: 0.52 mg via INTRAVENOUS
  Filled 2017-09-16 (×2): qty 1

## 2017-09-16 MED ORDER — SODIUM CHLORIDE 0.9 % IV SOLN: INTRAVENOUS | Status: DC

## 2017-09-16 MED ORDER — PHENYLEPHRINE 40 MCG/ML (10ML) SYRINGE FOR IV PUSH (FOR BLOOD PRESSURE SUPPORT)
PREFILLED_SYRINGE | INTRAVENOUS | Status: AC
  Filled 2017-09-16: qty 10

## 2017-09-16 MED ORDER — METHOCARBAMOL 1000 MG/10ML IJ SOLN
500.0000 mg | Freq: Four times a day (QID) | INTRAMUSCULAR | Status: DC | PRN
  Administered 2017-09-16 – 2017-09-17 (×2): 500 mg via INTRAVENOUS
  Filled 2017-09-16: qty 550
  Filled 2017-09-16: qty 5
  Filled 2017-09-16: qty 550

## 2017-09-16 MED ORDER — ALBUMIN HUMAN 5 % IV SOLN
INTRAVENOUS | Status: DC | PRN
  Administered 2017-09-16: 19:00:00 via INTRAVENOUS

## 2017-09-16 MED ORDER — ENOXAPARIN SODIUM 40 MG/0.4ML ~~LOC~~ SOLN
40.0000 mg | SUBCUTANEOUS | Status: DC
  Administered 2017-09-17 – 2017-09-24 (×7): 40 mg via SUBCUTANEOUS
  Filled 2017-09-16 (×7): qty 0.4

## 2017-09-16 MED ORDER — EPHEDRINE SULFATE 50 MG/ML IJ SOLN
INTRAMUSCULAR | Status: DC | PRN
  Administered 2017-09-16 (×2): 10 mg via INTRAVENOUS

## 2017-09-16 MED ORDER — METHOCARBAMOL 500 MG PO TABS
500.0000 mg | ORAL_TABLET | Freq: Four times a day (QID) | ORAL | Status: DC | PRN
  Administered 2017-09-17: 500 mg via ORAL
  Filled 2017-09-16 (×2): qty 1

## 2017-09-16 MED ORDER — TRAMADOL HCL 50 MG PO TABS
50.0000 mg | ORAL_TABLET | Freq: Four times a day (QID) | ORAL | Status: DC | PRN
  Administered 2017-09-18: 50 mg via ORAL
  Administered 2017-09-21 – 2017-09-22 (×2): 100 mg via ORAL
  Filled 2017-09-16: qty 2
  Filled 2017-09-16: qty 1
  Filled 2017-09-16: qty 2

## 2017-09-16 MED ORDER — METOCLOPRAMIDE HCL 5 MG/ML IJ SOLN: 5.0000 mg | Freq: Three times a day (TID) | INTRAMUSCULAR | Status: DC | PRN

## 2017-09-16 MED ORDER — ONDANSETRON HCL 4 MG/2ML IJ SOLN
INTRAMUSCULAR | Status: AC
  Filled 2017-09-16: qty 2

## 2017-09-16 MED ORDER — MORPHINE SULFATE (PF) 2 MG/ML IV SOLN
0.5000 mg | INTRAVENOUS | Status: DC | PRN
  Administered 2017-09-16: 0.5 mg via INTRAVENOUS
  Filled 2017-09-16: qty 1

## 2017-09-16 MED ORDER — SUCCINYLCHOLINE CHLORIDE 200 MG/10ML IV SOSY
PREFILLED_SYRINGE | INTRAVENOUS | Status: AC
  Filled 2017-09-16: qty 10

## 2017-09-16 MED ORDER — PHENOL 1.4 % MT LIQD
1.0000 | OROMUCOSAL | Status: DC | PRN
  Filled 2017-09-16: qty 177

## 2017-09-16 MED ORDER — ACETAMINOPHEN 650 MG RE SUPP: 650.0000 mg | Freq: Four times a day (QID) | RECTAL | Status: DC | PRN

## 2017-09-16 MED ORDER — ACETAMINOPHEN 325 MG PO TABS: 650.0000 mg | ORAL_TABLET | Freq: Four times a day (QID) | ORAL | Status: DC | PRN

## 2017-09-16 MED ORDER — FENTANYL CITRATE (PF) 100 MCG/2ML IJ SOLN
50.0000 ug | INTRAMUSCULAR | Status: AC | PRN
  Administered 2017-09-16 (×2): 50 ug via INTRAVENOUS
  Filled 2017-09-16 (×2): qty 2

## 2017-09-16 MED ORDER — LACTATED RINGERS IV SOLN
INTRAVENOUS | Status: DC | PRN
  Administered 2017-09-16 (×2): via INTRAVENOUS

## 2017-09-16 MED ORDER — SUCCINYLCHOLINE CHLORIDE 20 MG/ML IJ SOLN
INTRAMUSCULAR | Status: DC | PRN
  Administered 2017-09-16: 140 mg via INTRAVENOUS

## 2017-09-16 MED ORDER — MORPHINE SULFATE (PF) 4 MG/ML IV SOLN
1.0000 mg | INTRAVENOUS | Status: DC | PRN
  Administered 2017-09-16 – 2017-09-18 (×6): 1 mg via INTRAVENOUS
  Filled 2017-09-16 (×6): qty 1

## 2017-09-16 MED ORDER — ONDANSETRON HCL 4 MG/2ML IJ SOLN
4.0000 mg | Freq: Four times a day (QID) | INTRAMUSCULAR | Status: DC | PRN
  Administered 2017-09-16 (×2): 4 mg via INTRAVENOUS
  Filled 2017-09-16: qty 2

## 2017-09-16 MED ORDER — LIDOCAINE HCL (CARDIAC) 20 MG/ML IV SOLN
INTRAVENOUS | Status: DC | PRN
  Administered 2017-09-16: 60 mg via INTRAVENOUS

## 2017-09-16 MED ORDER — PROPOFOL 10 MG/ML IV BOLUS
INTRAVENOUS | Status: AC
  Filled 2017-09-16: qty 20

## 2017-09-16 MED ORDER — FENTANYL CITRATE (PF) 100 MCG/2ML IJ SOLN
25.0000 ug | INTRAMUSCULAR | Status: DC | PRN
  Administered 2017-09-16: 25 ug via INTRAVENOUS

## 2017-09-16 MED ORDER — SODIUM CHLORIDE 0.9 % IV SOLN
INTRAVENOUS | Status: DC
  Administered 2017-09-16 – 2017-09-18 (×5): via INTRAVENOUS

## 2017-09-16 MED ORDER — LIDOCAINE 2% (20 MG/ML) 5 ML SYRINGE
INTRAMUSCULAR | Status: AC
  Filled 2017-09-16: qty 5

## 2017-09-16 MED ORDER — DEXAMETHASONE SODIUM PHOSPHATE 10 MG/ML IJ SOLN: 8.0000 mg | Freq: Once | INTRAMUSCULAR | Status: DC

## 2017-09-16 MED ORDER — CEFAZOLIN SODIUM-DEXTROSE 2-4 GM/100ML-% IV SOLN
2.0000 g | Freq: Four times a day (QID) | INTRAVENOUS | Status: AC
  Administered 2017-09-17 (×2): 2 g via INTRAVENOUS
  Filled 2017-09-16 (×3): qty 100

## 2017-09-16 MED ORDER — LISINOPRIL 5 MG PO TABS
5.0000 mg | ORAL_TABLET | Freq: Every day | ORAL | Status: DC
  Administered 2017-09-17: 5 mg via ORAL
  Filled 2017-09-16: qty 1

## 2017-09-16 MED ORDER — PROPOFOL 10 MG/ML IV BOLUS
INTRAVENOUS | Status: DC | PRN
  Administered 2017-09-16: 140 mg via INTRAVENOUS

## 2017-09-16 MED ORDER — METOCLOPRAMIDE HCL 5 MG PO TABS: 5.0000 mg | ORAL_TABLET | Freq: Three times a day (TID) | ORAL | Status: DC | PRN

## 2017-09-16 MED ORDER — EPHEDRINE 5 MG/ML INJ
INTRAVENOUS | Status: AC
Start: 2017-09-16 — End: ?
  Filled 2017-09-16: qty 10

## 2017-09-16 MED ORDER — HYDROCODONE-ACETAMINOPHEN 5-325 MG PO TABS
1.0000 | ORAL_TABLET | Freq: Four times a day (QID) | ORAL | Status: DC | PRN
  Administered 2017-09-17 – 2017-09-19 (×3): 2 via ORAL
  Administered 2017-09-19 – 2017-09-20 (×2): 1 via ORAL
  Administered 2017-09-20 – 2017-09-24 (×3): 2 via ORAL
  Filled 2017-09-16: qty 2
  Filled 2017-09-16: qty 1
  Filled 2017-09-16 (×2): qty 2
  Filled 2017-09-16: qty 1
  Filled 2017-09-16 (×3): qty 2

## 2017-09-16 MED ORDER — POVIDONE-IODINE 10 % EX SWAB: 2.0000 "application " | Freq: Once | CUTANEOUS | Status: DC

## 2017-09-16 MED ORDER — CEFAZOLIN SODIUM-DEXTROSE 2-4 GM/100ML-% IV SOLN
2.0000 g | Freq: Once | INTRAVENOUS | Status: AC
  Administered 2017-09-16: 2 g via INTRAVENOUS

## 2017-09-16 MED ORDER — ONDANSETRON HCL 4 MG/2ML IJ SOLN
4.0000 mg | Freq: Once | INTRAMUSCULAR | Status: AC
  Administered 2017-09-16: 4 mg via INTRAVENOUS
  Filled 2017-09-16: qty 2

## 2017-09-16 MED ORDER — CHLORHEXIDINE GLUCONATE 4 % EX LIQD
60.0000 mL | Freq: Once | CUTANEOUS | Status: DC
  Filled 2017-09-16: qty 60

## 2017-09-16 MED ORDER — PHENYLEPHRINE HCL 10 MG/ML IJ SOLN
INTRAMUSCULAR | Status: DC | PRN
  Administered 2017-09-16 (×2): 80 ug via INTRAVENOUS

## 2017-09-16 MED ORDER — FENTANYL CITRATE (PF) 100 MCG/2ML IJ SOLN
INTRAMUSCULAR | Status: DC | PRN
  Administered 2017-09-16: 50 ug via INTRAVENOUS
  Administered 2017-09-16: 25 ug via INTRAVENOUS

## 2017-09-16 SURGICAL SUPPLY — 39 items
BAG SPEC THK2 15X12 ZIP CLS (MISCELLANEOUS) ×1
BAG ZIPLOCK 12X15 (MISCELLANEOUS) ×3 IMPLANT
BIT DRILL CANN LG 4.3MM (BIT) ×1 IMPLANT
BNDG GAUZE ELAST 4 BULKY (GAUZE/BANDAGES/DRESSINGS) ×3 IMPLANT
CLOSURE WOUND 1/2 X4 (GAUZE/BANDAGES/DRESSINGS) ×1
COVER PERINEAL POST (MISCELLANEOUS) ×3 IMPLANT
COVER SURGICAL LIGHT HANDLE (MISCELLANEOUS) ×3 IMPLANT
DRAPE STERI IOBAN 125X83 (DRAPES) ×3 IMPLANT
DRILL BIT CANN LG 4.3MM (BIT) ×3
DRSG MEPILEX BORDER 4X4 (GAUZE/BANDAGES/DRESSINGS) ×3 IMPLANT
DRSG MEPILEX BORDER 4X8 (GAUZE/BANDAGES/DRESSINGS) ×3 IMPLANT
DURAPREP 26ML APPLICATOR (WOUND CARE) ×3 IMPLANT
ELECT REM PT RETURN 15FT ADLT (MISCELLANEOUS) ×3 IMPLANT
FACESHIELD WRAPAROUND (MASK) ×6 IMPLANT
GLOVE BIO SURGEON STRL SZ7.5 (GLOVE) ×3 IMPLANT
GLOVE BIO SURGEON STRL SZ8 (GLOVE) ×6 IMPLANT
GLOVE BIOGEL PI IND STRL 8 (GLOVE) ×2 IMPLANT
GLOVE BIOGEL PI INDICATOR 8 (GLOVE) ×4
GOWN STRL REUS W/TWL LRG LVL3 (GOWN DISPOSABLE) ×3 IMPLANT
GOWN STRL REUS W/TWL XL LVL3 (GOWN DISPOSABLE) ×3 IMPLANT
GUIDEPIN 3.2X17.5 THRD DISP (PIN) ×3 IMPLANT
KIT BASIN OR (CUSTOM PROCEDURE TRAY) ×3 IMPLANT
MANIFOLD NEPTUNE II (INSTRUMENTS) ×3 IMPLANT
NAIL HIP FRACT 130D 11X180 (Screw) ×3 IMPLANT
NS IRRIG 1000ML POUR BTL (IV SOLUTION) ×3 IMPLANT
PACK GENERAL/GYN (CUSTOM PROCEDURE TRAY) ×3 IMPLANT
POSITIONER SURGICAL ARM (MISCELLANEOUS) ×3 IMPLANT
SCREW BONE CORTICAL 5.0X40 (Screw) ×3 IMPLANT
SCREW LAG 10.5MMX105MM HFN (Screw) ×3 IMPLANT
STAPLER VISISTAT 35W (STAPLE) ×3 IMPLANT
STRIP CLOSURE SKIN 1/2X4 (GAUZE/BANDAGES/DRESSINGS) ×2 IMPLANT
SUT VIC AB 0 CT1 27 (SUTURE) ×2
SUT VIC AB 0 CT1 27XBRD ANTBC (SUTURE) ×1 IMPLANT
SUT VIC AB 1 CT1 27 (SUTURE) ×3
SUT VIC AB 1 CT1 27XBRD ANTBC (SUTURE) ×1 IMPLANT
SUT VIC AB 2-0 CT1 27 (SUTURE) ×3
SUT VIC AB 2-0 CT1 TAPERPNT 27 (SUTURE) ×1 IMPLANT
TOWEL OR 17X26 10 PK STRL BLUE (TOWEL DISPOSABLE) ×3 IMPLANT
TRAY FOLEY W/METER SILVER 16FR (SET/KITS/TRAYS/PACK) ×3 IMPLANT

## 2017-09-16 NOTE — H&P (Signed)
[facility] History and Physical  Leng Montesdeoca YQM:[id number] DOB: 1941/12/07 DOA: 09/16/2017  Referring physician:  PCP: [name], MD   Chief Complaint:  HPI:   76 year old male with a history of gastroesophageal reflux disease, hyperglycemia, dyslipidemia, hypertension who presents to the ED after a fall . Patient went down to the basement of his home to get his lawnmower, when he slipped and fell in the standing water in his basement. Patient was brought in by his wife who called EMS. Patient denied being sick or ill prior to the fall. Patient denies hitting his head or loss of consciousness. After the fall he just could not get up and walk ED course  BP (!) 161/79 (BP Location: Right Arm)   Pulse 64   Temp (!) 97.4 F (36.3 C)   Resp 16   Ht 1.88 m ( )   Wt 99.8 kg (220 lb)   SpO2 100%   BMI 28.25 kg/m  CBC CMP within normal limits, chest x-ray negative, CT hip Comminuted intertrochanteric fracture with displaced lesser trochanter. Moderate surrounding hematoma extending into the adductor compartment.     Review of Systems: negative for the following  Constitutional: Denies fever, chills, diaphoresis, appetite change and fatigue.  HEENT: Denies photophobia, eye pain, redness, hearing loss, ear pain, congestion, sore throat, rhinorrhea, sneezing, mouth sores, trouble swallowing, neck pain, neck stiffness and tinnitus.  Respiratory: Denies SOB, DOE, cough, chest tightness, and wheezing.  Cardiovascular: Denies chest pain, palpitations and leg swelling.  Gastrointestinal: Denies nausea, vomiting, abdominal pain, diarrhea, constipation, blood in stool and abdominal distention.  Genitourinary: Denies dysuria, urgency, frequency, hematuria, flank pain and difficulty urinating.  Musculoskeletal: Denies myalgias, back pain,  positive for right hip joint swelling, arthralgias and gait problem.  Skin: Denies pallor, rash and wound.  Neurological: Denies dizziness,  seizures, syncope, weakness, light-headedness, numbness and headaches.  Hematological: Denies adenopathy. Easy bruising, personal or family bleeding history  Psychiatric/Behavioral: Denies suicidal ideation, mood changes, confusion, nervousness, sleep disturbance and agitation       Past Medical History:  Diagnosis Date  . Chronic back pain   . 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 Diverticulitis 1992  . ED (erectile dysfunction)   . GERD (gastroesophageal reflux disease)   . Headache, post-traumatic, chronic   . Hyperglycemia    Type 2, mild  . Hyperlipidemia    Diet controlled  . Hypertension   . Tinnitus    chronic     Past Surgical History:  Procedure Laterality Date  . BACK SURGERY     x 6- including fusion  . CIRCUMCISION    . NASAL SEPTUM SURGERY  1999  . SHOULDER SURGERY  1999   2011 for bone spur      Social History:  reports that he has never smoked. He has never used smokeless tobacco. He reports that he does not drink alcohol or use drugs.    Allergies  Allergen Reactions  . Aspirin     REACTION: GI upset  . Codeine Sulfate     REACTION: nausea  . Naproxen Sodium     REACTION: GI upset    Family History  Problem Relation Age of Onset  . Aneurysm Brother        Brain        Prior to Admission medications   Medication Sig Start Date End Date Taking? Authorizing Provider  lisinopril (PRINIVIL,ZESTRIL) 5 MG tablet Take 1 tablet (5 mg total)  by mouth daily. Patient not taking: Reported on 09/16/2017 12/14/16   [name], MD  sildenafil (VIAGRA) 100 MG tablet TAKE 1 TABLET BY MOUTH EVERY DAY AS NEEDED FOR ERECTILE DYSFUNCTION Patient not taking: Reported on 09/16/2017 08/23/17   [name], MD     Physical Exam: Vitals:   09/16/17 0839 09/16/17 0844 09/16/17 0855 09/16/17 0856  BP:   (!) 161/79 (!) 161/79  Pulse:   69 64  Resp:   11 16  Temp:    (!) 97.4 F (36.3 C)  SpO2: 95%  99% 100%  Weight:  99.8 kg (220  lb)    Height:   (1.88 m)          Vitals:   09/16/17 0839 09/16/17 0844 09/16/17 0855 09/16/17 0856  BP:   (!) 161/79 (!) 161/79  Pulse:   69 64  Resp:   11 16  Temp:    (!) 97.4 F (36.3 C)  SpO2: 95%  99% 100%  Weight:  99.8 kg (220 lb)    Height:   (1.88 m)     Constitutional: NAD, calm, comfortable Eyes: PERRL, lids and conjunctivae normal ENMT: Mucous membranes are moist. Posterior pharynx clear of any exudate or lesions.Normal dentition.  Neck: normal, supple, no masses, no thyromegaly Respiratory: clear to auscultation bilaterally, no wheezing, no crackles. Normal respiratory effort. No accessory muscle use.  Cardiovascular: Regular rate and rhythm, no murmurs / rubs / gallops. No extremity edema. 2+ pedal pulses. No carotid bruits.  Abdomen: no tenderness, no masses palpated. No hepatosplenomegaly. Bowel sounds positive.  Musculoskeletal: He exhibits no edema.       Right hip: He exhibits tenderness.       Right upper leg: He exhibits bony tenderness.  Neurological: He is alert. He has normal strength. No cranial nerve deficit (no facial droop, extraocular movements intact, no slurred speech) or sensory deficit. He exhibits normal muscle tone. He displays no seizure activity. Coordination normal.   Skin: no rashes, lesions, ulcers. No induration Psychiatric: Normal judgment and insight. Alert and oriented x 3. Normal mood.     Labs on Admission: I have personally reviewed following labs and imaging studies  CBC:  Recent Labs Lab 09/16/17 0944  WBC 9.8  NEUTROABS 5.2  HGB 13.8  HCT 41.5  MCV 93.3  PLT 231    Basic Metabolic Panel:  Recent Labs Lab 09/16/17 0944  NA 140  K 3.7  CL 103  CO2 28  GLUCOSE 143*  BUN 14  CREATININE 1.20  CALCIUM 9.0    GFR: Estimated Creatinine Clearance: 67.1 mL/min (by C-G formula based on SCr of 1.2 mg/dL).  Liver Function Tests: No results for input(s): AST, ALT, ALKPHOS, BILITOT, PROT, ALBUMIN in the  last 168 hours. No results for input(s): LIPASE, AMYLASE in the last 168 hours. No results for input(s): AMMONIA in the last 168 hours.  Coagulation Profile:  Recent Labs Lab 09/16/17 0944  INR 0.97   No results for input(s): DDIMER in the last 72 hours.  Cardiac Enzymes: No results for input(s): CKTOTAL, CKMB, CKMBINDEX, TROPONINI in the last 168 hours.  BNP (last 3 results) No results for input(s): PROBNP in the last 8760 hours.  HbA1C: No results for input(s): HGBA1C in the last 72 hours. Lab Results  Component Value Date   HGBA1C 6.5 06/14/2017   HGBA1C 6.5 12/10/2016   HGBA1C 6.2 06/02/2016     CBG: No results for input(s): GLUCAP in the last  168 hours.  Lipid Profile: No results for input(s): CHOL, HDL, LDLCALC, TRIG, CHOLHDL, LDLDIRECT in the last 72 hours.  Thyroid Function Tests: No results for input(s): TSH, T4TOTAL, FREET4, T3FREE, THYROIDAB in the last 72 hours.  Anemia Panel: No results for input(s): VITAMINB12, FOLATE, FERRITIN, TIBC, IRON, RETICCTPCT in the last 72 hours.  Urine analysis:    Component Value Date/Time   BILIRUBINUR Small 06/15/2017 1458   PROTEINUR Negative 06/15/2017 1458   UROBILINOGEN 0.2 06/15/2017 1458   NITRITE Negative 06/15/2017 1458   LEUKOCYTESUR Negative 06/15/2017 1458    Sepsis Labs: (procalcitonin:4,lacticidven:4) )No results found for this or any previous visit (from the past 240 hour(s)).       Radiological Exams on Admission: Dg Hip Unilat With Pelvis 2-3 Views Right  Result Date: 09/16/2017 CLINICAL DATA:  Fall, right hip pain EXAM: DG HIP (WITH OR WITHOUT PELVIS) 2-3V RIGHT COMPARISON:  None. FINDINGS: Irregular linear lucency extending from the greater trochanter for towards a displaced lesser trochanter favors an intertrochanteric right hip fracture. Bilateral hip joint spaces are preserved. Visualized bony pelvis appears intact. Mild degenerative changes of the lower lumbar spine. IMPRESSION:  Suspected intertrochanteric right hip fracture with displaced lesser trochanter. Electronically Signed   By: Charline Bills M.D.   On: 09/16/2017 09:50   Dg Femur 1v Right  Result Date: 09/16/2017 CLINICAL DATA:  Fall, right hip pain EXAM: RIGHT FEMUR 1 VIEW COMPARISON:  None. FINDINGS: Suspected nondisplaced intertrochanteric right hip fracture, poorly visualized. Displaced lesser trochanter. Distal femur appears intact. IMPRESSION: Suspected nondisplaced intertrochanteric right hip fracture, poorly visualized. Displaced lesser trochanter. Electronically Signed   By: Charline Bills M.D.   On: 09/16/2017 09:49   Mr Brain Wo Contrast  Result Date: 08/25/2017  [facility] [address] [place], [place] [address] [phone] NEUROIMAGING REPORT STUDY DATE: 08/23/2017 PATIENT NAME: Ching Rabideau DOB: December 12, 1941 MRN: [id number] ORDERING CLINICIAN: Dr Anne Hahn CLINICAL HISTORY:  83 year patient with memory loss COMPARISON FILMS: none available EXAM: MRI Brain wo TECHNIQUE:MRI of the brain without contrast was obtained utilizing 5 mm axial slices with T1, T2, T2 flair, T2 star gradient echo and diffusion weighted views.  T1 sagittal and T2 coronal views were obtained. STUDY : MRI Brain wo IMAGING SITE: Imaging FINDINGS: The brain parenchyma shows remote age lacunar infarcts involving bilateral caudate head, general of the left internal capsule/anterior thalamus, right globus pallidus as well as central pons and moderate changes of chronic microvascular ischemia. There is mild degree of generalized cerebral atrophy. No other structural lesion, tumor infarcts are noted. No abnormal lesions are seen on diffusion-weighted views to suggest acute ischemia. The cortical sulci, fissures and cisterns are normal in size and appearance. Lateral ventricles show slight dilatation proportionate to the degree of central atrophy but, third and fourth ventricle are normal in size and  appearance. No extra-axial fluid collections are seen. No evidence of mass effect or midline shift.  On sagittal views the posterior fossa, pituitary gland and corpus callosum are unremarkable. No evidence of intracranial hemorrhage on gradient-echo views. The orbits and their contents,  and calvarium are unremarkable.The paranasal sinuses show mild chronic inflammatory changes. Intracranial flow voids are present. Cranial vertebral junction appears unremarkable. There are mild degenerative changes noted over the upper cervical spine.   Abnormal MRI scan of the brain showing multiple remote age lacunar infarcts involving bilateral basal ganglia and pons along with moderate changes of chronic microvascular ischemia and mild degree of generalized cerebral atrophy. No acute  abnormalities are noted. INTERPRETING PHYSICIAN: Delia Heady, MD Certified in  Neuroimaging by American Society of Neuroimaging and [place] [employer] Neu[employer]specialities   Dg Hip Unilat With Pelvis 2-3 Views Right  Result Date: 09/16/2017 CLINICAL DATA:  Fall, right hip pain EXAM: DG HIP (WITH OR WITHOUT PELVIS) 2-3V RIGHT COMPARISON:  None. FINDINGS: Irregular linear lucency extending from the greater trochanter for towards a displaced lesser trochanter favors an intertrochanteric right hip fracture. Bilateral hip joint spaces are preserved. Visualized bony pelvis appears intact. Mild degenerative changes of the lower lumbar spine. IMPRESSION: Suspected intertrochanteric right hip fracture with displaced lesser trochanter. Electronically Signed   By: Charline Bills M.D.   On: 09/16/2017 09:50   Dg Femur 1v Right  Result Date: 09/16/2017 CLINICAL DATA:  Fall, right hip pain EXAM: RIGHT FEMUR 1 VIEW COMPARISON:  None. FINDINGS: Suspected nondisplaced intertrochanteric right hip fracture, poorly visualized. Displaced lesser trochanter. Distal femur appears intact. IMPRESSION: Suspected nondisplaced intertrochanteric right hip  fracture, poorly visualized. Displaced lesser trochanter. Electronically Signed   By: Charline Bills M.D.   On: 09/16/2017 09:49      EKG: Independently reviewed. * Sinus rhythm Nonspecific intraventricular conduction delay  Assessment/Plan Principal Problem:   Supracondylar fracture of left humerus without intercondylar fracture  Mechanical fall Given age, medical history, deemed to be low to moderate risk from a cardiovascular standpoint,Okay to proceed Will keep npo Hip protocol initiated Dr Lequita Halt notified by Dr. Lynelle Doctor  Hypertension-continue lisinopril   Hypokalemia-replete    DVT prophylaxis:  Lovenox  Code Status History    This patient does not have a recorded code status. Please follow your organizational policy for patients in this situation.       consults called: Orthopedics  Family Communication: Admission, patients condition and plan of care including tests being ordered have been discussed with the patient  who indicates understanding and agree with the plan and Code Status  Admission status: inpatient    Disposition plan: Further plan will depend as patient's clinical course evolves and further radiologic and laboratory data become available. Likely home when stable   At the time of admission, it appears that the appropriate admission status for this patient is INPATIENT .Thisis judged to be reasonable and necessary in order to provide the required intensity of service to ensure the patient's safetygiven thepresenting symptoms, physical exam findings, and initial radiographic and laboratory data in the context of their chronic comorbidities.   Richarda Overlie MD [facility] Pager [phone]  If 7PM-7AM, please contact night-coverage [email or web address] Password [email or web address]  09/16/2017, 11:00 AM

## 2017-09-16 NOTE — Anesthesia Preprocedure Evaluation (Signed)
Anesthesia Evaluation  Patient identified by MRN, date of birth, ID band Patient awake    Reviewed: Allergy & Precautions, NPO status , Patient's Chart, lab work & pertinent test results  Airway Mallampati: II  TM Distance: >3 FB     Dental   Pulmonary neg pulmonary ROS,    breath sounds clear to auscultation       Cardiovascular hypertension,  Rhythm:Regular Rate:Normal     Neuro/Psych  Headaches,    GI/Hepatic Neg liver ROS, GERD  ,  Endo/Other  negative endocrine ROS  Renal/GU negative Renal ROS     Musculoskeletal   Abdominal   Peds  Hematology   Anesthesia Other Findings   Reproductive/Obstetrics                             Anesthesia Physical Anesthesia Plan  ASA: III  Anesthesia Plan: General   Post-op Pain Management:    Induction: Intravenous  PONV Risk Score and Plan: 2 and Ondansetron, Dexamethasone and Treatment may vary due to age or medical condition  Airway Management Planned: Oral ETT  Additional Equipment:   Intra-op Plan:   Post-operative Plan: Possible Post-op intubation/ventilation  Informed Consent: I have reviewed the patients History and Physical, chart, labs and discussed the procedure including the risks, benefits and alternatives for the proposed anesthesia with the patient or authorized representative who has indicated his/her understanding and acceptance.   Dental advisory given  Plan Discussed with: CRNA and Anesthesiologist  Anesthesia Plan Comments:         Anesthesia Quick Evaluation

## 2017-09-16 NOTE — Anesthesia Postprocedure Evaluation (Signed)
Anesthesia Post Note  Patient: Robert Fitzgerald  Procedure(s) Performed: Procedure(s) (LRB): INTRAMEDULLARY (IM) NAIL FEMORAL (Right)     Patient location during evaluation: PACU Anesthesia Type: General Level of consciousness: awake Pain management: pain level controlled Respiratory status: spontaneous breathing Cardiovascular status: stable Anesthetic complications: no    Last Vitals:  Vitals:   09/16/17 2045 09/16/17 2100  BP: (!) 175/93 (!) 177/94  Pulse: 90 87  Resp: 15 (!) 41  Temp:    SpO2: 97% 96%    Last Pain:  Vitals:   09/16/17 1604  TempSrc:   PainSc: 6                  [name]

## 2017-09-16 NOTE — Op Note (Signed)
  OPERATIVE REPORT   PREOPERATIVE DIAGNOSIS: Right intertrochanteric femur fracture.   POSTOP DIAGNOSIS: Right intertrochanteric femur fracture.   PROCEDURE: Intramedullary nailing, Right intertrochanteric femur  fracture.   SURGEON: Ollen Gross, M.D.   ASSISTANT: Avel Peace, PA-C  ANESTHESIA:General  Estimated BLOOD LOSS: minimal  DRAINS: None.   COMPLICATIONS:   None  CONDITION: -PACU - hemodynamically stable.    CLINICAL NOTE: Robert Fitzgerald is an 76 y.o. male, who had a fall this morning  days ago sustaining a displaced  Right intertrochanteric femur fracture. They have been cleared medically and present for operative fixation   PROCEDURE IN DETAIL: After successful administration of  General,  the patient was placed on the fracture table with Right lower extremity in a well-padded traction boot,  Left lower extremity in a well-padded leg holder. Under fluoroscopic guidance, the fracture wasreduced. The traction was locked in this position. Thigh was prepped  and draped in the usual sterile fashion. The guide pin for the Biomet  Affixus was then passed percutaneously to the tip of the greater  trochanter, and then entered into the femoral canal. It was passed into the  canal. The small incision was made and the starter reamer passed over  the guide pin. This was then removed. The nail which was an 11  mm  diameter short trochanteric nail with 130 degrees angle was attached to  the external guide and then passed into the femoral canal, impacted to  the appropriate depth in the canal, then we used the external guide to  place the lag screw. Through the external guide, a guide pin was  passed. Small incision made, and the guide pin was slightly posterior in the  femoral head on the AP and slightly center to posterior on the lateral.  Length was 105 mm. Triple reamer was passed over the guide pin. 105 mm  lag screw was placed. It was then locked down with a locking  screw.  Through the external guide, the distal interlock was placed through the  static hole and this was 40 mm in length with excellent bicortical  purchase. The external guide was then removed. Hardware was in good  position and fracture was well reduced. Wound was copiously irrigated with saline  solution, and  closed deep with interrupted 1 Vicryl, subcu  interrupted 2-0 Vicryl, subcuticular running 4-0 Monocryl. Incision was  cleaned and dried and sterile dressings applied. The patient was awakened and  transported to recovery in stable condition.   Robert Rankin Paizleigh Wilds, MD    09/16/2017, 8:01 PM

## 2017-09-16 NOTE — Transfer of Care (Signed)
Immediate Anesthesia Transfer of Care Note  Patient: Robert Fitzgerald  Procedure(s) Performed: Procedure(s): INTRAMEDULLARY (IM) NAIL FEMORAL (Right)  Patient Location: PACU  Anesthesia Type:General  Level of Consciousness: awake, alert  and oriented  Airway & Oxygen Therapy: Patient Spontanous Breathing and Patient connected to face mask oxygen  Post-op Assessment: Report given to RN and Post -op Vital signs reviewed and stable  Post vital signs: Reviewed and stable  Last Vitals:  Vitals:   09/16/17 1332 09/16/17 1436  BP: (!) 186/81 (!) 195/107  Pulse: 73 78  Resp: 17 18  Temp:  36.5 C  SpO2: 99% 98%    Last Pain:  Vitals:   09/16/17 1604  TempSrc:   PainSc: 6          Complications: No apparent anesthesia complications

## 2017-09-16 NOTE — ED Provider Notes (Signed)
[facility]-EMERGENCY DEPT Provider Note   CSN: [id number] Arrival date & time: 09/16/17  4098     History   Chief Complaint Chief Complaint  Patient presents with  . Fall    HPI Robert Fitzgerald is a 76 y.o. male.  HPI Patient presents to the emergency room for evaluation of right hip and leg pain.The patient went down to his basement this morning and tripped and stumbled.  Patient landed on his right hip. He began having severe pain in his hip and could not stand. He had to call EMS. he denies any headache or head injury. He denies any chest pain or shortness of breath.  No neck or back pain. Past Medical History:  Diagnosis Date  . Chronic back pain   . 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 Diverticulitis 1992  . ED (erectile dysfunction)   . GERD (gastroesophageal reflux disease)   . Headache, post-traumatic, chronic   . Hyperglycemia    Type 2, mild  . Hyperlipidemia    Diet controlled  . Hypertension   . Tinnitus    chronic    Patient Active Problem List   Diagnosis Date Noted  . Confusion 06/14/2017  . Short-term memory loss 06/14/2017  . Cough 06/14/2017  . History of recent fall 12/14/2016  . Need for hepatitis C screening test 12/10/2016  . Prostate cancer screening 12/09/2016  . Overuse of medication 05/04/2016  . Medicare annual wellness visit, initial 11/04/2015  . Leukocytosis 10/09/2014  . Hyperglycemia 08/11/2011  . MEMORY LOSS 07/11/2008  . Essential hypertension 02/01/2008  . Chronic back pain 02/01/2008  . HYPERCHOLESTEROLEMIA 01/31/2008  . ERECTILE DYSFUNCTION 01/31/2008  . TINNITUS, CHRONIC 01/31/2008  . HIATAL HERNIA WITH REFLUX 01/31/2008  . HEAD TRAUMA, CLOSED 01/31/2008    Past Surgical History:  Procedure Laterality Date  . BACK SURGERY     x 6- including fusion  . CIRCUMCISION    . NASAL SEPTUM SURGERY  1999  . SHOULDER SURGERY  1999   2011 for bone spur       Home Medications    Prior to Admission  medications   Medication Sig Start Date End Date Taking? Authorizing Provider  lisinopril (PRINIVIL,ZESTRIL) 5 MG tablet Take 1 tablet (5 mg total) by mouth daily. Patient not taking: Reported on 09/16/2017 12/14/16   Tower, Audrie Gallus, MD  sildenafil (VIAGRA) 100 MG tablet TAKE 1 TABLET BY MOUTH EVERY DAY AS NEEDED FOR ERECTILE DYSFUNCTION Patient not taking: Reported on 09/16/2017 08/23/17   [name], MD    Family History Family History  Problem Relation Age of Onset  . Aneurysm Brother        Brain    Social History Social History  Substance Use Topics  . Smoking status: Never Smoker  . Smokeless tobacco: Never Used  . Alcohol use No     Allergies   Aspirin; Codeine sulfate; and Naproxen sodium   Review of Systems Review of Systems  All other systems reviewed and are negative.    Physical Exam Updated Vital Signs BP (!) 161/79 (BP Location: Right Arm)   Pulse 64   Temp (!) 97.4 F (36.3 C)   Resp 16   Ht 1.88 m ( )   Wt 99.8 kg (220 lb)   SpO2 100%   BMI 28.25 kg/m   Physical Exam  Constitutional:  elderly  HENT:  Head: Normocephalic and atraumatic.  Right Ear: External ear normal.  Left Ear:  External ear normal.  Eyes: Conjunctivae are normal. Right eye exhibits no discharge. Left eye exhibits no discharge. No scleral icterus.  Neck: Neck supple. No tracheal deviation present.  Cardiovascular: Normal rate, regular rhythm and intact distal pulses.   Pulmonary/Chest: Effort normal and breath sounds normal. No stridor. No respiratory distress. He has no wheezes. He has no rales.  Abdominal: Soft. Bowel sounds are normal. He exhibits no distension. There is no tenderness. There is no rebound and no guarding.  Musculoskeletal: He exhibits no edema.       Right hip: He exhibits tenderness.       Right upper leg: He exhibits bony tenderness.  Neurological: He is alert. He has normal strength. No cranial nerve deficit (no facial droop, extraocular  movements intact, no slurred speech) or sensory deficit. He exhibits normal muscle tone. He displays no seizure activity. Coordination normal.  Skin: Skin is warm and dry. No rash noted. He is not diaphoretic.  Psychiatric: He has a normal mood and affect.  Nursing note and vitals reviewed.    ED Treatments / Results  Labs (all labs ordered are listed, but only abnormal results are displayed) Labs Reviewed  BASIC METABOLIC PANEL - Abnormal; Notable for the following:       Result Value   Glucose, Bld 143 (*)    GFR calc non Af Amer 57 (*)    All other components within normal limits  CBC WITH DIFFERENTIAL/PLATELET  PROTIME-INR  TYPE AND SCREEN  ABO/RH    EKG  EKG Interpretation  Date/Time:  Friday September 16 2017 09:38:11 EDT Ventricular Rate:  58 PR Interval:    QRS Duration: 115 QT Interval:  473 QTC Calculation: 465 R Axis:   -17 Text Interpretation:  Sinus rhythm Nonspecific intraventricular conduction delay No old tracing to compare Confirmed by Linwood Dibbles [phone]) on 09/16/2017 9:40:13 AM       Radiology Dg Hip Unilat With Pelvis 2-3 Views Right  Result Date: 09/16/2017 CLINICAL DATA:  Fall, right hip pain EXAM: DG HIP (WITH OR WITHOUT PELVIS) 2-3V RIGHT COMPARISON:  None. FINDINGS: Irregular linear lucency extending from the greater trochanter for towards a displaced lesser trochanter favors an intertrochanteric right hip fracture. Bilateral hip joint spaces are preserved. Visualized bony pelvis appears intact. Mild degenerative changes of the lower lumbar spine. IMPRESSION: Suspected intertrochanteric right hip fracture with displaced lesser trochanter. Electronically Signed   By: Charline Bills M.D.   On: 09/16/2017 09:50   Dg Femur 1v Right  Result Date: 09/16/2017 CLINICAL DATA:  Fall, right hip pain EXAM: RIGHT FEMUR 1 VIEW COMPARISON:  None. FINDINGS: Suspected nondisplaced intertrochanteric right hip fracture, poorly visualized. Displaced lesser  trochanter. Distal femur appears intact. IMPRESSION: Suspected nondisplaced intertrochanteric right hip fracture, poorly visualized. Displaced lesser trochanter. Electronically Signed   By: Charline Bills M.D.   On: 09/16/2017 09:49    Procedures Procedures (including critical care time)  Medications Ordered in ED Medications  0.9 %  sodium chloride infusion ( Intravenous New Bag/Given 09/16/17 0953)  fentaNYL (SUBLIMAZE) injection 50 mcg (50 mcg Intravenous Given 09/16/17 0912)  ondansetron (ZOFRAN) injection 4 mg (4 mg Intravenous Given 09/16/17 0911)     Initial Impression / Assessment and Plan / ED Course  I have reviewed the triage vital signs and the nursing notes.  Pertinent labs & imaging results that were available during my care of the patient were reviewed by me and considered in my medical decision making (see chart for details).  Clinical Course  as of Sep 16 1053  Fri Sep 16, 2017  1038 Discussed case with Dr Lequita Halt.  He reviewed the films.  Will see patient.  Requests a CT scan of the hip to confirm there is an intertrochanteric component.  [JK]  1052 Discussed with Dr Susie Cassette.  Will see patient.   [JK]    Clinical Course User Index [JK] Linwood Dibbles, MD    Patient presented to the emergency room with a mechanical fall and severe hip pain. No sx or findings to suggest other injuries.X-rays show a lesser trochanter fracture and a probable intertrochanteric fracture.  Plan is for CT scan to evaluate further.  Dr Lequita Halt will see patient and review CT scan.  Dr Susie Cassette will admit patient.   Findings and plan were discussed with patient and family.  Final Clinical Impressions(s) / ED Diagnoses   Final diagnoses:  Closed fracture of right hip, initial encounter [facility])    New Prescriptions New Prescriptions   No medications on file     Linwood Dibbles, MD 09/16/17 1054

## 2017-09-16 NOTE — Anesthesia Procedure Notes (Deleted)
Procedure Name: Intubation Date/Time: 09/16/2017 7:06 PM Performed by: Paris Lore Patient Re-evaluated:Patient Re-evaluated prior to induction Oxygen Delivery Method: Circle system utilized Induction Type: IV induction Ventilation: Mask ventilation without difficulty Laryngoscope Size: Miller and 3 Grade View: Grade I Tube type: Oral Number of attempts: 1 Placement Confirmation: ETT inserted through vocal cords under direct vision,  positive ETCO2 and CO2 detector Secured at: 21 cm Comments: Performed by Nadene Rubins, CRNA

## 2017-09-16 NOTE — ED Notes (Signed)
Patient in CT

## 2017-09-16 NOTE — Anesthesia Procedure Notes (Signed)
Procedure Name: Intubation Date/Time: 09/16/2017 7:02 PM Performed by: [place] Jona Zappone C Pre-anesthesia Checklist: Patient identified, Emergency Drugs available, Suction available and Patient being monitored Patient Re-evaluated:Patient Re-evaluated prior to induction Oxygen Delivery Method: Circle system utilized Preoxygenation: Pre-oxygenation with 100% oxygen Induction Type: IV induction Ventilation: Mask ventilation without difficulty Laryngoscope Size: Mac and 4 Grade View: Grade I Tube type: Oral Tube size: 7.5 mm Number of attempts: 1 Airway Equipment and Method: Stylet and Oral airway Placement Confirmation: ETT inserted through vocal cords under direct vision,  positive ETCO2 and breath sounds checked- equal and bilateral Secured at: 22 cm Tube secured with: Tape Dental Injury: Teeth and Oropharynx as per pre-operative assessment

## 2017-09-16 NOTE — ED Notes (Signed)
Bed: ZO10 Expected date:  Expected time:  Means of arrival:  Comments: EMS- 76yo M, fall/hip pain/meds given

## 2017-09-16 NOTE — Consult Note (Signed)
Reason for consult: Right intertrochanteric femur fracture  Physician requesting consult: Dr. Susie Cassette  HPI: [name] is a 76 yo male who was in his basement earlier this AM and slipped on water falling on his right side with immediate right hip pain and inability to get up. He did not hit his head or sustain a loss of consciousness. He came to the [facility] ED and evaluation showed a non-displaced right intertrochanteric femur fracture. He was admitted to the medical service and we were consulted for management. His only complaint is right hip pain. Denies any lower extremity paresthesia or radiating pain. No prior history of hip problems   Past Medical History:  Diagnosis Date  . Chronic back pain   . 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 Diverticulitis 1992  . ED (erectile dysfunction)   . GERD (gastroesophageal reflux disease)   . Headache, post-traumatic, chronic   . Hyperglycemia    Type 2, mild  . Hyperlipidemia    Diet controlled  . Hypertension   . Tinnitus    chronic          Past Surgical History:  Procedure Laterality Date  . BACK SURGERY     x 6- including fusion  . CIRCUMCISION    . NASAL SEPTUM SURGERY  1999  . SHOULDER SURGERY  1999   2011 for bone spur      Social History:  reports that he has never smoked. He has never used smokeless tobacco. He reports that he does not drink alcohol or use drugs.         Allergies  Allergen Reactions  . Aspirin     REACTION: GI upset  . Codeine Sulfate     REACTION: nausea  . Naproxen Sodium     REACTION: GI upset    Family History  Problem Relation Age of Onset  . Aneurysm Brother        Brain               Prior to Admission medications   Medication Sig Start Date End Date Taking? Authorizing Provider  lisinopril (PRINIVIL,ZESTRIL) 5 MG tablet Take 1 tablet (5 mg total) by mouth daily. Patient not taking: Reported on 09/16/2017 12/14/16    [name], MD  sildenafil (VIAGRA) 100 MG tablet TAKE 1 TABLET BY MOUTH EVERY DAY AS NEEDED FOR ERECTILE DYSFUNCTION Patient not taking: Reported on 09/16/2017 08/23/17   [name], MD     Physical Exam:       Vitals:   09/16/17 0839 09/16/17 0844 09/16/17 0855 09/16/17 0856  BP:   (!) 161/79 (!) 161/79  Pulse:   69 64  Resp:   11 16  Temp:    (!) 97.4 F (36.3 C)  SpO2: 95%  99% 100%  Weight:  99.8 kg (220 lb)    Height:   (1.88 m)                Vitals:   09/16/17 0839 09/16/17 0844 09/16/17 0855 09/16/17 0856  BP:   (!) 161/79 (!) 161/79  Pulse:   69 64  Resp:   11 16  Temp:    (!) 97.4 F (36.3 C)  SpO2: 95%  99% 100%  Weight:  99.8 kg (220 lb)    Height:   (1.88 m)      Physical Examination: General appearance - alert, well appearing, and in no distress Mental status - alert,  oriented to person, place, and time Chest - clear to auscultation, no wheezes, rales or rhonchi, symmetric air entry Heart - normal rate, regular rhythm, normal S1, S2, no murmurs, rubs, clicks or gallops Abdomen - soft, nontender, nondistended, no masses or organomegaly Neurological - alert, oriented, normal speech, no focal findings or movement disorder noted Right lower extremity shortened; pulses intact, EHL/FHL/TA/gastroc intact; sensation intact; tender over greater trochanter  X-ray/CT- Right intertrochanteric femur fracture  Right femur fracture- Plan OR for IM nailing right femur. Discussed procedure, risks and potential complications with patient and family who elect to proceed

## 2017-09-16 NOTE — ED Notes (Signed)
ED Provider at bedside. 

## 2017-09-16 NOTE — ED Triage Notes (Signed)
Per EMS patient reports right leg and hip pain.  Fell from standing.  Denies LOC or other injuries.  Given 100 fentanyl via EMS.  Patient from home.

## 2017-09-17 DIAGNOSIS — W19XXXA Unspecified fall, initial encounter: Secondary | ICD-10-CM

## 2017-09-17 DIAGNOSIS — S72001A Fracture of unspecified part of neck of right femur, initial encounter for closed fracture: Secondary | ICD-10-CM

## 2017-09-17 LAB — BASIC METABOLIC PANEL
ANION GAP: 10 (ref 5–15)
BUN: 11 mg/dL (ref 6–20)
CALCIUM: 8.6 mg/dL — AB (ref 8.9–10.3)
CHLORIDE: 102 mmol/L (ref 101–111)
CO2: 27 mmol/L (ref 22–32)
CREATININE: 1.19 mg/dL (ref 0.61–1.24)
GFR calc non Af Amer: 58 mL/min — ABNORMAL LOW (ref >60)
Glucose, Bld: 160 mg/dL — ABNORMAL HIGH (ref 65–99)
Potassium: 3.7 mmol/L (ref 3.5–5.1)
SODIUM: 139 mmol/L (ref 135–145)

## 2017-09-17 LAB — CBC
HEMATOCRIT: 31.8 % — AB (ref 39.0–52.0)
HEMOGLOBIN: 10.6 g/dL — AB (ref 13.0–17.0)
MCH: 31.6 pg (ref 26.0–34.0)
MCHC: 33.3 g/dL (ref 30.0–36.0)
MCV: 94.9 fL (ref 78.0–100.0)
Platelets: 211 10*3/uL (ref 150–400)
RBC: 3.35 MIL/uL — ABNORMAL LOW (ref 4.22–5.81)
RDW: 14 % (ref 11.5–15.5)
WBC: 14.9 10*3/uL — AB (ref 4.0–10.5)

## 2017-09-17 NOTE — Progress Notes (Signed)
Robert Fitzgerald  MRN: [id number] DOB/Age: [birth year] 76 y.o. [facility] Procedure: Procedure(s) (LRB): INTRAMEDULLARY (IM) NAIL FEMORAL (Right)     Subjective: Still very painful with poor bed mobility this am. Surgery was last evening so expected, has not had oral analgesics  Vital Signs Temp:  [97.7 F (36.5 C)-98.9 F (37.2 C)] 98.9 F (37.2 C) (09/22 0532) Pulse Rate:  [68-105] 102 (09/22 0532) Resp:  [15-41] 18 (09/22 0532) BP: (149-195)/(72-110) 149/85 (09/22 0532) SpO2:  [96 %-100 %] 97 % (09/22 0532) Weight:  [99.8 kg (220 lb)] 99.8 kg (220 lb) (09/21 1438)  Lab Results  Recent Labs  09/16/17 0944 09/17/17 0558  WBC 9.8 14.9*  HGB 13.8 10.6*  HCT 41.5 31.8*  PLT 231 211   BMET  Recent Labs  09/16/17 0944 09/17/17 0558  NA 140 139  K 3.7 3.7  CL 103 102  CO2 28 27  GLUCOSE 143* 160*  BUN 14 11  CREATININE 1.20 1.19  CALCIUM 9.0 8.6*   INR  Date Value Ref Range Status  09/16/2017 0.97  Final     Exam Right thigh incisions and dressings are clean and dry Thigh soft Moves foot well        Plan Up with PT Start po analgesics  Semaj Coburn PA-C  09/17/2017, 9:48 AM Contact # [phone]

## 2017-09-17 NOTE — Progress Notes (Signed)
PROGRESS NOTE  Robert Fitzgerald GNF:[id number] DOB: October 14, 1941 DOA: 09/16/2017 PCP: [name], MD  HPI/Recap of past 24 hours:  C/o leg pain, no fever, no n/v, no sob Wife at bedside  Assessment/Plan: Principal Problem:   Supracondylar fracture of left humerus without intercondylar fracture Active Problems:   Prostate cancer screening   Hip fracture (HCC)   Supracondylar fracture of left humerus without intercondylar fracture  Mechanical fall S/p Intramedullary nailing, Right intertrochanteric femur on 9/21 DVT prophylaxis per ortho Management per ortho   HTN: continue home meds lisinopril   Code Status: full  Family Communication: patient and wife  Disposition Plan: SNF   Consultants:  ortho  Procedures: Intramedullary nailing, Right intertrochanteric femur on 9/21  Antibiotics:  Perioperative ancef   Objective: BP (!) 149/85 (BP Location: Right Arm)   Pulse (!) 102   Temp 98.9 F (37.2 C) (Oral)   Resp 18   Ht  (1.88 m)   Wt 99.8 kg (220 lb)   SpO2 97%   BMI 28.25 kg/m   Intake/Output Summary (Last 24 hours) at 09/17/17 1029 Last data filed at 09/17/17 1010  Gross per 24 hour  Intake          3064.17 ml  Output             1300 ml  Net          1764.17 ml   Filed Weights   09/16/17 0844 09/16/17 1438  Weight: 99.8 kg (220 lb) 99.8 kg (220 lb)    Exam: Patient is examined daily including today on 09/17/2017, exams remain the same as of yesterday except that has changed    General:  NAD  Cardiovascular: RRR  Respiratory: CTABL  Abdomen: Soft/ND/NT, positive BS  Musculoskeletal: right hip post op changes, dressing intact, + peripheral pulse.  Neuro: alert, oriented   Data Reviewed: Basic Metabolic Panel:  Recent Labs Lab 09/16/17 0944 09/17/17 0558  NA 140 139  K 3.7 3.7  CL 103 102  CO2 28 27  GLUCOSE 143* 160*  BUN 14 11  CREATININE 1.20 1.19  CALCIUM 9.0 8.6*   Liver Function Tests: No results for  input(s): AST, ALT, ALKPHOS, BILITOT, PROT, ALBUMIN in the last 168 hours. No results for input(s): LIPASE, AMYLASE in the last 168 hours. No results for input(s): AMMONIA in the last 168 hours. CBC:  Recent Labs Lab 09/16/17 0944 09/17/17 0558  WBC 9.8 14.9*  NEUTROABS 5.2  --   HGB 13.8 10.6*  HCT 41.5 31.8*  MCV 93.3 94.9  PLT 231 211   Cardiac Enzymes:   No results for input(s): CKTOTAL, CKMB, CKMBINDEX, TROPONINI in the last 168 hours. BNP (last 3 results) No results for input(s): BNP in the last 8760 hours.  ProBNP (last 3 results) No results for input(s): PROBNP in the last 8760 hours.  CBG:  Recent Labs Lab 09/16/17 2044  GLUCAP 182*    No results found for this or any previous visit (from the past 240 hour(s)).   Studies: Ct Hip Right Wo Contrast  Result Date: 09/16/2017 CLINICAL DATA:  Right hip pain after fall. EXAM: CT OF THE RIGHT HIP WITHOUT CONTRAST TECHNIQUE: Multidetector CT imaging of the right hip was performed according to the standard protocol. Multiplanar CT image reconstructions were also generated. COMPARISON:  Right hip x-rays from same date. FINDINGS: Bones/Joint/Cartilage There is a comminuted intertrochanteric fracture, with medial and superior displacement of the lesser trochanter. No other fractures identified. No  dislocation. Moderate degenerative changes of the right hip and sacroiliac joints. Osteopenia. Ligaments Suboptimally assessed by CT. Muscles and Tendons No focal abnormality. Soft tissues Moderate amount of hematoma about the fracture site, extending into the adductor compartment. Edema/hematoma is also seen along the proximal superficial femoral vessels. IMPRESSION: Comminuted intertrochanteric fracture with displaced lesser trochanter. Moderate surrounding hematoma extending into the adductor compartment. Electronically Signed   By: Obie Dredge M.D.   On: 09/16/2017 11:34   Chest Portable 1 View  Result Date: 09/16/2017 CLINICAL  DATA:  76 year old male status post fall this morning with right hip fracture. EXAM: PORTABLE CHEST 1 VIEW COMPARISON:  06/14/2017. FINDINGS: Portable AP supine view at 1144 hours. Stable lung volumes. Stable cardiac size and mediastinal contours. Visualized tracheal air column is within normal limits. Allowing for portable technique the lungs are clear. No pneumothorax or pleural effusion. IMPRESSION: No acute cardiopulmonary abnormality. Electronically Signed   By: Odessa Fleming M.D.   On: 09/16/2017 12:04   Dg C-arm 1-60 Min-no Report  Result Date: 09/16/2017 Fluoroscopy was utilized by the requesting physician.  No radiographic interpretation.   Dg Hip Port Unilat With Pelvis 1v Right  Result Date: 09/16/2017 CLINICAL DATA:  Right hip surgery EXAM: DG HIP (WITH OR WITHOUT PELVIS) 1V PORT RIGHT COMPARISON:  09/16/2017 FINDINGS: Three low resolution intraoperative spot views of the right hip. The images demonstrate intramedullary rod and distal screw fixation of the proximal right femur for intertrochanteric fracture. Total fluoroscopy time was 31 seconds. IMPRESSION: Intraoperative fluoroscopic assistance provided during surgical fixation of right femoral fracture Electronically Signed   By: Jasmine Pang M.D.   On: 09/16/2017 21:20    Scheduled Meds: . enoxaparin (LOVENOX) injection  40 mg Subcutaneous Q24H  . lisinopril  5 mg Oral Daily    Continuous Infusions: . sodium chloride 75 mL/hr at 09/16/17 2211  . methocarbamol (ROBAXIN)  IV 500 mg (09/17/17 0618)     Time spent: I have personally reviewed and interpreted on  09/17/2017 daily labs, tele strips, imagings as discussed above under date review session and assessment and plans.  I reviewed all nursing notes, pharmacy notes, consultant notes,  vitals, pertinent old records  I have discussed plan of care as described above with RN , patient and family on 09/17/2017   Bennet Kujawa MD, PhD  [facility] Pager [phone]. If  7PM-7AM, please contact night-coverage at [email or web address], password [facility] 09/17/2017, 10:29 AM  LOS: 1 day

## 2017-09-17 NOTE — Progress Notes (Signed)
OT Cancellation Note  Patient Details Name: Robert Fitzgerald MRN: [id number] DOB: 02/18/1941   Cancelled Treatment:    Reason Eval/Treat Not Completed: Other (comment) - Patient's plan is SNF; will defer OT needs to SNF.  Lashaundra Lehrmann A Adiah Guereca 09/17/2017, 1:15 PM

## 2017-09-17 NOTE — Evaluation (Signed)
Physical Therapy Evaluation Patient Details Name: Robert Fitzgerald MRN: [id number] DOB: 05-08-1941 Today's Date: 09/17/2017   History of Present Illness  76 year old male with a history of closed head trauma, gastroesophageal reflux disease, hyperglycemia, dyslipidemia, hypertension who presents to the ED after a fall . Patient went down to the basement of his home to get his lawnmower, when he slipped and fell in the standing water in his basement. Patient was brought in by his wife who called EMS.  Patient denies hitting his head or loss of consciousness; pt has  R femur fx, s/p IM nail per Dr. Lequita Halt on 09/16/17  Clinical Impression  Pt admitted with above diagnosis. Pt currently with functional limitations due to the deficits listed below (see PT Problem List).  Pt will benefit from skilled PT to increase their independence and safety with mobility to allow discharge to the venue listed below.   Will need SNF post acute    Follow Up Recommendations SNF    Equipment Recommendations  None recommended by PT    Recommendations for Other Services       Precautions / Restrictions Precautions Precautions: Fall Restrictions Weight Bearing Restrictions: No Other Position/Activity Restrictions: WBAT      Mobility  Bed Mobility Overal bed mobility: Needs Assistance Bed Mobility: Supine to Sit     Supine to sit: +2 for physical assistance;Max assist     General bed mobility comments: assist for trunk and LEs, incr time, multi-modal cues to participate and stay on task  Transfers Overall transfer level: Needs assistance Equipment used: Rolling walker (2 wheeled) Transfers: Sit to/from [employer] Sit to Stand: Mod assist;+2 safety/equipment;+2 physical assistance Stand pivot transfers: Mod assist;+2 physical assistance;+2 safety/equipment       General transfer comment: multi-modal cues for hand placement, wt shfit to stand, sequencing; hand over hand needed  to guide pt to sitting  Ambulation/Gait             General Gait Details: NT/unable other than pivotal  steps to chair  Stairs            Wheelchair Mobility    Modified Rankin (Stroke Patients Only)       Balance Overall balance assessment: Needs assistance;History of Falls   Sitting balance-Leahy Scale: Fair       Standing balance-Leahy Scale: Zero                               Pertinent Vitals/Pain Pain Assessment: Faces Faces Pain Scale: Hurts even more Pain Location: R hip Pain Descriptors / Indicators: Sore;Grimacing Pain Intervention(s): Limited activity within patient's tolerance;Monitored during session;Premedicated before session;Repositioned    Home Living                        Prior Function                 Hand Dominance        Extremity/Trunk Assessment   Upper Extremity Assessment Upper Extremity Assessment: Defer to OT evaluation    Lower Extremity Assessment Lower Extremity Assessment: RLE deficits/detail RLE: Unable to fully assess due to pain       Communication   Communication: No difficulties  Cognition Arousal/Alertness: Awake/alert Behavior During Therapy: Agitated Overall Cognitive Status: Impaired/Different from baseline Area of Impairment: Following commands;Problem solving;Orientation                 Orientation Level:  Disoriented to;Place;Time;Situation     Following Commands: Follows one step commands inconsistently;Follows multi-step commands inconsistently     Problem Solving: Slow processing;Decreased initiation;Difficulty sequencing;Requires verbal cues;Requires tactile cues General Comments: pt family reports his mentation is clear at baseline      General Comments      Exercises     Assessment/Plan    PT Assessment Patient needs continued PT services  PT Problem List Decreased strength;Decreased range of motion;Decreased activity tolerance;Pain;Decreased  mobility;Decreased knowledge of use of DME;Decreased safety awareness       PT Treatment Interventions DME instruction;Gait training;Functional mobility training;Therapeutic exercise;Therapeutic activities;Patient/family education    PT Goals (Current goals can be found in the Care Plan section)  Acute Rehab PT Goals Patient Stated Goal: none stated PT Goal Formulation: Patient unable to participate in goal setting Time For Goal Achievement: 09/24/17 Potential to Achieve Goals: Fair    Frequency Min 3X/week   Barriers to discharge Decreased caregiver support pt wife in ED today after fall    Co-evaluation               AM-PAC PT "6 Clicks" Daily Activity  Outcome Measure Difficulty turning over in bed (including adjusting bedclothes, sheets and blankets)?: A Lot Difficulty moving from lying on back to sitting on the side of the bed? : A Lot Difficulty sitting down on and standing up from a chair with arms (e.g., wheelchair, bedside commode, etc,.)?: A Lot Help needed moving to and from a bed to chair (including a wheelchair)?: A Lot Help needed walking in hospital room?: Total Help needed climbing 3-5 steps with a railing? : Total 6 Click Score: 10    End of Session Equipment Utilized During Treatment: Gait belt Activity Tolerance: Other (comment);Patient limited by pain (limited by cognition) Patient left: in chair;with family/visitor present;with call bell/phone within reach;with chair alarm set Nurse Communication: Mobility status PT Visit Diagnosis: Difficulty in walking, not elsewhere classified (R26.2)    Time: 1610-9604 PT Time Calculation (min) (ACUTE ONLY): 25 min   Charges:   PT Evaluation $PT Eval Moderate Complexity: 1 Mod PT Treatments $Therapeutic Activity: 8-22 mins   PT G CodesDrucilla Chalet, PT Pager: [phone] 09/17/2017   [facility] 09/17/2017, 5:40 PM

## 2017-09-18 ENCOUNTER — Inpatient Hospital Stay (HOSPITAL_COMMUNITY): Payer: Medicare Other

## 2017-09-18 DIAGNOSIS — R410 Disorientation, unspecified: Secondary | ICD-10-CM

## 2017-09-18 DIAGNOSIS — R413 Other amnesia: Secondary | ICD-10-CM

## 2017-09-18 LAB — BASIC METABOLIC PANEL
Anion gap: 9 (ref 5–15)
BUN: 14 mg/dL (ref 6–20)
CALCIUM: 8.6 mg/dL — AB (ref 8.9–10.3)
CO2: 24 mmol/L (ref 22–32)
CREATININE: 1.12 mg/dL (ref 0.61–1.24)
Chloride: 105 mmol/L (ref 101–111)
GFR calc Af Amer: >60 mL/min (ref >60)
GLUCOSE: 164 mg/dL — AB (ref 65–99)
POTASSIUM: 3.8 mmol/L (ref 3.5–5.1)
Sodium: 138 mmol/L (ref 135–145)

## 2017-09-18 LAB — CBC
HCT: 27.3 % — ABNORMAL LOW (ref 39.0–52.0)
Hemoglobin: 9.1 g/dL — ABNORMAL LOW (ref 13.0–17.0)
MCH: 31.6 pg (ref 26.0–34.0)
MCHC: 33.3 g/dL (ref 30.0–36.0)
MCV: 94.8 fL (ref 78.0–100.0)
PLATELETS: 175 10*3/uL (ref 150–400)
RBC: 2.88 MIL/uL — ABNORMAL LOW (ref 4.22–5.81)
RDW: 13.9 % (ref 11.5–15.5)
WBC: 16.5 10*3/uL — ABNORMAL HIGH (ref 4.0–10.5)

## 2017-09-18 LAB — MAGNESIUM: Magnesium: 1.8 mg/dL (ref 1.7–2.4)

## 2017-09-18 MED ORDER — DIPHENHYDRAMINE HCL 50 MG/ML IJ SOLN
25.0000 mg | Freq: Four times a day (QID) | INTRAMUSCULAR | Status: AC | PRN
  Administered 2017-09-18 – 2017-09-19 (×3): 25 mg via INTRAMUSCULAR
  Filled 2017-09-18 (×3): qty 1

## 2017-09-18 MED ORDER — HALOPERIDOL LACTATE 5 MG/ML IJ SOLN
2.0000 mg | Freq: Once | INTRAMUSCULAR | Status: AC
  Administered 2017-09-18: 2 mg via INTRAVENOUS

## 2017-09-18 MED ORDER — DIPHENHYDRAMINE HCL 50 MG/ML IJ SOLN
INTRAMUSCULAR | Status: AC
  Filled 2017-09-18: qty 1

## 2017-09-18 MED ORDER — SODIUM CHLORIDE 0.9 % IV BOLUS (SEPSIS)
1000.0000 mL | Freq: Once | INTRAVENOUS | Status: AC
  Administered 2017-09-18: 1000 mL via INTRAVENOUS

## 2017-09-18 MED ORDER — QUETIAPINE FUMARATE 25 MG PO TABS
25.0000 mg | ORAL_TABLET | Freq: Two times a day (BID) | ORAL | Status: DC
  Administered 2017-09-18 – 2017-09-25 (×14): 25 mg via ORAL
  Filled 2017-09-18 (×15): qty 1

## 2017-09-18 MED ORDER — POTASSIUM CHLORIDE 10 MEQ/100ML IV SOLN
10.0000 meq | INTRAVENOUS | Status: AC
  Administered 2017-09-18 (×4): 10 meq via INTRAVENOUS
  Filled 2017-09-18 (×4): qty 100

## 2017-09-18 MED ORDER — METOPROLOL TARTRATE 5 MG/5ML IV SOLN
5.0000 mg | Freq: Once | INTRAVENOUS | Status: AC
  Administered 2017-09-18: 5 mg via INTRAVENOUS

## 2017-09-18 MED ORDER — HALOPERIDOL LACTATE 5 MG/ML IJ SOLN
INTRAMUSCULAR | Status: AC
  Filled 2017-09-18: qty 1

## 2017-09-18 MED ORDER — SODIUM CHLORIDE 0.9 % IV BOLUS (SEPSIS)
500.0000 mL | Freq: Once | INTRAVENOUS | Status: AC
  Administered 2017-09-18: 500 mL via INTRAVENOUS

## 2017-09-18 MED ORDER — METOPROLOL TARTRATE 5 MG/5ML IV SOLN
INTRAVENOUS | Status: AC
  Filled 2017-09-18: qty 5

## 2017-09-18 MED ORDER — DIPHENHYDRAMINE HCL 50 MG/ML IJ SOLN
25.0000 mg | Freq: Once | INTRAMUSCULAR | Status: AC
  Administered 2017-09-18: 25 mg via INTRAMUSCULAR

## 2017-09-18 MED ORDER — MAGNESIUM SULFATE 2 GM/50ML IV SOLN
2.0000 g | Freq: Once | INTRAVENOUS | Status: AC
  Administered 2017-09-18: 2 g via INTRAVENOUS
  Filled 2017-09-18: qty 50

## 2017-09-18 MED ORDER — SENNOSIDES-DOCUSATE SODIUM 8.6-50 MG PO TABS
1.0000 | ORAL_TABLET | Freq: Two times a day (BID) | ORAL | Status: DC
  Administered 2017-09-20 – 2017-09-25 (×9): 1 via ORAL
  Filled 2017-09-18 (×10): qty 1

## 2017-09-18 MED ORDER — METOPROLOL TARTRATE 5 MG/5ML IV SOLN
2.5000 mg | Freq: Three times a day (TID) | INTRAVENOUS | Status: DC
  Administered 2017-09-18 – 2017-09-19 (×4): 2.5 mg via INTRAVENOUS
  Filled 2017-09-18 (×5): qty 5

## 2017-09-18 MED ORDER — METOPROLOL TARTRATE 5 MG/5ML IV SOLN
5.0000 mg | Freq: Once | INTRAVENOUS | Status: AC
  Administered 2017-09-18: 5 mg via INTRAVENOUS
  Filled 2017-09-18: qty 5

## 2017-09-18 NOTE — Progress Notes (Addendum)
PROGRESS NOTE  Robert Fitzgerald JXB:[id number] DOB: 1941/08/09 DOA: 09/16/2017 PCP: [name], MD  HPI/Recap of past 24 hours:  Post op day two,  Patient went into SVT at about 5;40 am, he received iv lopressor and ivf bolus, now converted to sinus tachycardia.   He is agitated and confused, he is in four point restraint now. Rapid response RN at bedside.  No fever  Assessment/Plan: Principal Problem:   Supracondylar fracture of left humerus without intercondylar fracture Active Problems:   Prostate cancer screening   Hip fracture (HCC)    SVT:  Keep patient on tele,  D/c lisinopril, start iv lopressor scheduled. Iv potassium and iv mag ordered, Recent tsh from 05/2017 was 2.  Post op delirium: Per chart review , patient was recently evaluated by neurology for memory loss, patient was not oriented to date when he was seen by neurology. Recent MRI brain ordered by neurology showed multiple lacunar infarct. Patient likely has baseline vascular dementia. Haldol ordered  x2, but RN report iv was not working properly, she is not sure the haldol was delivered or not. im benadryl  ordered next. Patient is in four point restraints.   Leukocytosis: wbc 14.9-16.5, no fever, ua/cxr unremarkable Likely from stress and dehydration ivf for now, repeat cbc in am.  Post op anemia:  hgb 13.8 -10.6-9.1 Monitor,   Supracondylar fracture of left humerus without intercondylar fracture  Mechanical fall S/p Intramedullary nailing, Right intertrochanteric femur on 9/21 DVT prophylaxis per ortho Management per ortho   HTN:  Hold home meds lisinopril Start iv lopressor due to svt episodes.   Code Status: full  Family Communication: patient and wife  Disposition Plan: SNF   Consultants:  ortho  Procedures: Intramedullary nailing, Right intertrochanteric femur on 9/21  Antibiotics:  Perioperative ancef   Objective: BP 115/77 (BP Location: Right Arm)    Pulse (!) 106   Temp 98.3 F (36.8 C) (Oral)   Resp 20   Ht  (1.88 m)   Wt 99.8 kg (220 lb)   SpO2 97%   BMI 28.25 kg/m   Intake/Output Summary (Last 24 hours) at 09/18/17 0743 Last data filed at 09/18/17 0507  Gross per 24 hour  Intake           2057.5 ml  Output              600 ml  Net           1457.5 ml   Filed Weights   09/16/17 0844 09/16/17 1438  Weight: 99.8 kg (220 lb) 99.8 kg (220 lb)    Exam: Patient is examined daily including today on 09/18/2017, exams remain the same as of yesterday except that has changed    General:  Agitated, confused  Cardiovascular: sinus tachycardia  Respiratory: CTABL  Abdomen: Soft/ND/NT, positive BS  Musculoskeletal: right hip post op changes, dressing intact, + peripheral pulse.  Neuro: agitated, confused  Data Reviewed: Basic Metabolic Panel:  Recent Labs Lab 09/16/17 0944 09/17/17 0558 09/18/17 0614  NA 140 139 138  K 3.7 3.7 3.8  CL 103 102 105  CO2 GLUCOSE 143* 160* 164*  BUN CREATININE 1.20 1.19 1.12  CALCIUM 9.0 8.6* 8.6*  MG  --   --  1.8   Liver Function Tests: No results for input(s): AST, ALT, ALKPHOS, BILITOT, PROT, ALBUMIN in the last 168 hours. No results for input(s): LIPASE, AMYLASE in the last 168 hours.  No results for input(s): AMMONIA in the last 168 hours. CBC:  Recent Labs Lab 09/16/17 0944 09/17/17 0558 09/18/17 0614  WBC 9.8 14.9* 16.5*  NEUTROABS 5.2  --   --   HGB 13.8 10.6* 9.1*  HCT 41.5 31.8* 27.3*  MCV 93.3 94.9 94.8  PLT 231 211 175   Cardiac Enzymes:   No results for input(s): CKTOTAL, CKMB, CKMBINDEX, TROPONINI in the last 168 hours. BNP (last 3 results) No results for input(s): BNP in the last 8760 hours.  ProBNP (last 3 results) No results for input(s): PROBNP in the last 8760 hours.  CBG:  Recent Labs Lab 09/16/17 2044  GLUCAP 182*    No results found for this or any previous visit (from the past 240 hour(s)).   Studies: No  results found.  Scheduled Meds: . enoxaparin (LOVENOX) injection  40 mg Subcutaneous Q24H  . haloperidol lactate      . haloperidol lactate  2 mg Intravenous Once  . metoprolol tartrate  2.5 mg Intravenous Q8H    Continuous Infusions: . sodium chloride 75 mL/hr at 09/18/17 0537  . magnesium sulfate 1 - 4 g bolus IVPB    . methocarbamol (ROBAXIN)  IV 500 mg (09/17/17 0618)  . potassium chloride    . sodium chloride       Time spent: I have personally reviewed and interpreted on  09/18/2017 daily labs, tele strips, imagings as discussed above under date review session and assessment and plans.  I reviewed all nursing notes, pharmacy notes, consultant notes,  vitals, pertinent old records  I have discussed plan of care as described above with RN , patient  on 09/18/2017   Maxi Rodas MD, PhD  [facility] Pager [phone]. If 7PM-7AM, please contact night-coverage at [email or web address], password [facility] 09/18/2017, 7:43 AM  LOS: 2 days

## 2017-09-18 NOTE — Progress Notes (Signed)
Subjective: 2 Days Post-Op Procedure(s) (LRB): INTRAMEDULLARY (IM) NAIL FEMORAL (Right) Patient reports pain as moderate.   Remains confused this AM  Objective: Vital signs in last 24 hours: Temp:  [98.3 F (36.8 C)-98.5 F (36.9 C)] 98.3 F (36.8 C) (09/22 2053) Pulse Rate:  [92-106] 106 (09/23 0507) Resp:  [19-20] 20 (09/23 0507) BP: (115-165)/(68-77) 115/77 (09/23 0507) SpO2:  [96 %-99 %] 97 % (09/23 0507)  Intake/Output from previous day: 09/22 0701 - 09/23 0700 In: 2057.5 [P.O.:480; I.V.:1577.5] Out: 600 [Urine:600] Intake/Output this shift: Total I/O In: 120 [P.O.:120] Out: -    Recent Labs  09/16/17 0944 09/17/17 0558 09/18/17 0614  HGB 13.8 10.6* 9.1*    Recent Labs  09/17/17 0558 09/18/17 0614  WBC 14.9* 16.5*  RBC 3.35* 2.88*  HCT 31.8* 27.3*  PLT 211 175    Recent Labs  09/17/17 0558 09/18/17 0614  NA 139 138  K 3.7 3.8  CL 102 105  CO2 27 24  BUN 11 14  CREATININE 1.19 1.12  GLUCOSE 160* 164*  CALCIUM 8.6* 8.6*    Recent Labs  09/16/17 0944  INR 0.97    Intact pulses distally Dorsiflexion/Plantar flexion intact No cellulitis present Compartment soft  Assessment/Plan: 2 Days Post-Op Procedure(s) (LRB): INTRAMEDULLARY (IM) NAIL FEMORAL (Right) Up with therapy  Will require SNF at discharge  Robert Fitzgerald V 09/18/2017, 9:57 AM

## 2017-09-18 NOTE — Progress Notes (Signed)
Pt continues with noncompliance and disruptive behavior. Medication helps slightly. Pt refuses PO medication. Daughter of pt came to visit and cannot get pt to take his medication, eat, now calm down. MD ordered soft wrist and ankle restraints.

## 2017-09-18 NOTE — Progress Notes (Signed)
Writer spoke with pt's daughter, Jillyn Hidden, and made her aware of pt being in restraints due to irrational behavior. Daughter stated she will come to hospital soon. Pt remains in 4 point restraints.

## 2017-09-18 NOTE — Progress Notes (Signed)
Pt continues with his noncompliance and verbally abusive behavior. Writer and staff staying by pt to ensure safety of pt. Close monitoring continues.

## 2017-09-18 NOTE — Progress Notes (Signed)
Pt remains in restraints. Agitated and delusional. Bites and sips only. IV medications/fluids continue. Condom catheter in place.

## 2017-09-18 NOTE — Progress Notes (Signed)
Patients hr elevated at 177 during routine vital check.  On call physician paged and orders given for 5 mg of lopressor x1.  Lopressor given, HR decreased to 142, and BP 89 systolic.  500 cc bolus given bp up to 109 systolic.  Orders given to give an additional dose of  Lopressor.  Lopressor mg given.  Patient converted to NSR with a HR of 89.  Pt will be monitored on telemetry and continuous pule ox.   Jody Aguinaga RN

## 2017-09-18 NOTE — Progress Notes (Signed)
Writer scanned pt's bladder at this time, with no urine noted.  Tech documented at 1340 this afternoon 450cc out of condom cath--currently 200cc in foley drainage bag.

## 2017-09-18 NOTE — Progress Notes (Signed)
Pt remains in bed with restraints on. Condom catheter in place.  Spouse and daughter came for short visit. Pt remains confused and agitated. Continues to pull at lines, tubes, catheter, and tries to get OOB. Pain and agitation medication given as ordered IV/IM. Pt continues to refuse PO meds. Bites and sips only. Close monitoring continues.

## 2017-09-18 NOTE — Progress Notes (Signed)
Patient heart rate in sustained 150s-160s. Patient is agitated. Repositioned and given MSO4 for pain. Paged provider on call for further orders.

## 2017-09-18 NOTE — Progress Notes (Signed)
Spoke with provider Blount and advised to give Metoprolol early and will go from there to see how heart rate responds. Will c/t monitor.

## 2017-09-18 NOTE — Progress Notes (Signed)
Writer arrived to floor to find pt agitated, restless, pulling lines, tubes, equipment, trying to get out of bed, and cursing at staff. Pt medicated and MD called. MD reported to room. New orders received and noted. Will try IM medication for pt, as pt now has a positional IV site. Will try to get another IV site for medications and fluids. Daughter of pt, Robert Fitzgerald, called and made aware.

## 2017-09-19 ENCOUNTER — Inpatient Hospital Stay (HOSPITAL_COMMUNITY): Payer: Medicare Other

## 2017-09-19 ENCOUNTER — Encounter (HOSPITAL_COMMUNITY): Payer: Self-pay | Admitting: Orthopedic Surgery

## 2017-09-19 DIAGNOSIS — I350 Nonrheumatic aortic (valve) stenosis: Secondary | ICD-10-CM

## 2017-09-19 DIAGNOSIS — E876 Hypokalemia: Secondary | ICD-10-CM

## 2017-09-19 DIAGNOSIS — I471 Supraventricular tachycardia: Secondary | ICD-10-CM

## 2017-09-19 LAB — CBC
HEMATOCRIT: 24.5 % — AB (ref 39.0–52.0)
HEMOGLOBIN: 8.3 g/dL — AB (ref 13.0–17.0)
MCH: 32 pg (ref 26.0–34.0)
MCHC: 33.9 g/dL (ref 30.0–36.0)
MCV: 94.6 fL (ref 78.0–100.0)
Platelets: 187 10*3/uL (ref 150–400)
RBC: 2.59 MIL/uL — ABNORMAL LOW (ref 4.22–5.81)
RDW: 13.9 % (ref 11.5–15.5)
WBC: 12 10*3/uL — ABNORMAL HIGH (ref 4.0–10.5)

## 2017-09-19 LAB — BASIC METABOLIC PANEL
Anion gap: 8 (ref 5–15)
BUN: 18 mg/dL (ref 6–20)
CHLORIDE: 109 mmol/L (ref 101–111)
CO2: 22 mmol/L (ref 22–32)
CREATININE: 1.03 mg/dL (ref 0.61–1.24)
Calcium: 8.1 mg/dL — ABNORMAL LOW (ref 8.9–10.3)
Glucose, Bld: 164 mg/dL — ABNORMAL HIGH (ref 65–99)
POTASSIUM: 3.9 mmol/L (ref 3.5–5.1)
Sodium: 139 mmol/L (ref 135–145)

## 2017-09-19 LAB — TROPONIN I
TROPONIN I: 0.19 ng/mL — AB (ref <0.03)
TROPONIN I: 0.19 ng/mL — AB (ref <0.03)
Troponin I: 0.18 ng/mL (ref <0.03)

## 2017-09-19 LAB — MAGNESIUM: MAGNESIUM: 1.9 mg/dL (ref 1.7–2.4)

## 2017-09-19 LAB — ECHOCARDIOGRAM COMPLETE
Height: 74 in
Weight: 3520 oz

## 2017-09-19 LAB — AMMONIA: AMMONIA: 27 umol/L (ref 9–35)

## 2017-09-19 LAB — LACTIC ACID, PLASMA: Lactic Acid, Venous: 1.4 mmol/L (ref 0.5–1.9)

## 2017-09-19 MED ORDER — METOPROLOL TARTRATE 5 MG/5ML IV SOLN: 2.5000 mg | Freq: Four times a day (QID) | INTRAVENOUS | Status: DC

## 2017-09-19 MED ORDER — POTASSIUM CHLORIDE 10 MEQ/100ML IV SOLN
10.0000 meq | INTRAVENOUS | Status: AC
  Administered 2017-09-19 (×2): 10 meq via INTRAVENOUS
  Filled 2017-09-19 (×2): qty 100

## 2017-09-19 MED ORDER — HALOPERIDOL LACTATE 5 MG/ML IJ SOLN
1.0000 mg | Freq: Four times a day (QID) | INTRAMUSCULAR | Status: DC | PRN
Start: 2017-09-19 — End: 2017-09-25
  Administered 2017-09-20 – 2017-09-21 (×2): 1 mg via INTRAVENOUS
  Filled 2017-09-19 (×2): qty 1

## 2017-09-19 MED ORDER — HALOPERIDOL LACTATE 5 MG/ML IJ SOLN
2.0000 mg | Freq: Once | INTRAMUSCULAR | Status: AC
  Administered 2017-09-19: 2 mg via INTRAVENOUS
  Filled 2017-09-19: qty 1

## 2017-09-19 MED ORDER — SODIUM CHLORIDE 0.9 % IV BOLUS (SEPSIS)
500.0000 mL | Freq: Once | INTRAVENOUS | Status: AC
  Administered 2017-09-19: 500 mL via INTRAVENOUS

## 2017-09-19 MED ORDER — MAGNESIUM SULFATE IN D5W 1-5 GM/100ML-% IV SOLN
1.0000 g | Freq: Once | INTRAVENOUS | Status: AC
  Administered 2017-09-19: 1 g via INTRAVENOUS
  Filled 2017-09-19: qty 100

## 2017-09-19 MED ORDER — METOPROLOL TARTRATE 5 MG/5ML IV SOLN
2.5000 mg | INTRAVENOUS | Status: DC
  Administered 2017-09-19 – 2017-09-20 (×7): 2.5 mg via INTRAVENOUS
  Filled 2017-09-19 (×7): qty 5

## 2017-09-19 MED ORDER — METOPROLOL TARTRATE 5 MG/5ML IV SOLN
5.0000 mg | Freq: Once | INTRAVENOUS | Status: AC
Start: 2017-09-19 — End: 2017-09-19
  Administered 2017-09-19: 5 mg via INTRAVENOUS
  Filled 2017-09-19: qty 5

## 2017-09-19 MED ORDER — DILTIAZEM HCL 25 MG/5ML IV SOLN
5.0000 mg | Freq: Once | INTRAVENOUS | Status: DC
  Filled 2017-09-19: qty 5

## 2017-09-19 MED ORDER — BISACODYL 10 MG RE SUPP
10.0000 mg | Freq: Every day | RECTAL | Status: AC
  Administered 2017-09-19: 10 mg via RECTAL
  Filled 2017-09-19: qty 1

## 2017-09-19 MED ORDER — METOPROLOL TARTRATE 5 MG/5ML IV SOLN
5.0000 mg | Freq: Once | INTRAVENOUS | Status: AC
  Administered 2017-09-19: 5 mg via INTRAVENOUS

## 2017-09-19 NOTE — Progress Notes (Signed)
Orders noted from provider. Will c/t monitor.

## 2017-09-19 NOTE — Progress Notes (Signed)
Patient 12 beat run of svt, with bigeminy.  Dr. Roda Shutters, as well as cardiology NP Annie Paras made aware.

## 2017-09-19 NOTE — Clinical Social Work Note (Signed)
Clinical Social Work Assessment  Patient Details  Name: Robert Fitzgerald MRN: [id number] Date of Birth: Nov 02, 1941  Date of referral:  09/19/17               Reason for consult:  Facility Placement                Permission sought to share information with:    Permission granted to share information::     Name::        Agency::     Relationship::  Cuffe,Kathleen/Helms,Deana  Contact Information:    [phone]/ [phone]  Housing/Transportation Living arrangements for the past 2 months:  Single Family Home Source of Information:  Patient Patient Interpreter Needed:  None Criminal Activity/Legal Involvement Pertinent to Current Situation/Hospitalization:  No - Comment as needed Significant Relationships:  Adult Children, Spouse Lives with:  Self Do you feel safe going back to the place where you live?  Yes Need for family participation in patient care:  Yes   Care giving concerns: Patient went down to basement of his home to get lawnmower, when he slipped and fell in the standing water in his basement . Patient denies being sick prior to fall. Patient denies hitting his head loss of consciousness, after the fall he could not walk.   PT recommending SNF placement.  Patient fractured left humerus.  Patient confused. Patient in 4 point restraints./Will need to be removed for 24 hours.    Social Worker assessment / plan:  CSW met with pt. Spouse and daughter, explain role and discussed patient discharge plan. Physical Therapy is recommending SNF rehab before patient return home with spouse. Patient daughter states the patient is High Fall risk but insist on leaving the bed. She reports the patient is confused and can be agitated easily at times. CSW discussed SNF process and faxing out.Patient daughter express concerns with pt. Walking out facility. CSW discussed locked facilities in the area. CSW will follow up with bed offer list.   Plan: Complete FL2/ PASRR   Employment  status:  Retired Forensic scientist:  Medicare PT Recommendations:  [place] / Referral to community resources:  [place]  Patient/Family's Response to care:  Agreeable and Responding well to care. Appreciative of CSW service.   Patient/Family's Understanding of and Emotional Response to Diagnosis, Current Treatment, and Prognosis:  " We are concern he will refuse to go to SNF."  Emotional Assessment Appearance:  Appears stated age Attitude/Demeanor/Rapport:   (Agitated and Confused) Affect (typically observed):    Orientation:  Oriented to Self Alcohol / Substance use:  Not Applicable Psych involvement (Current and /or in the community):  No  Discharge Needs  Concerns to be addressed:  Discharge Planning Concerns, Care Coordination Readmission within the last 30 days:  No Current discharge risk:  Dependent with Mobility Barriers to Discharge:  Continued Medical Work up, Patient in 4 point restraints./Will need to be removed for 24 hours.    Lia Hopping, LCSW 09/19/2017, 3:33 PM

## 2017-09-19 NOTE — Significant Event (Signed)
Rapid Response Event Note  Overview: Time Called: 0329 Arrival Time: 0340 Event Type: Cardiac  Initial Focused Assessment:  Called to room d/t rapid heart rate.  Pt well known to me from a previous RRT call the following night for the same situation.  Pt lying in bed, slightly agitated, in 4 point restraints due to confusion and attempting to pull out lines and disconnect equipment.  Pt is alert and oriented to name, but disoriented to place and time, which is not acutely new to this situation tonight.  Connected patient to monitor which showed A-fib rates 150-165, BP 133/97 with a repeat BP of 100/80.  Notified Blount,NP about situation.  She also was aware of patient d/t her being the one that was on last night when the same situation happened.  She ordered Lopressor  IV x 1 now, which was given at 0355.  She also gave orders if BP dropped to give 500 NS bolus, and if Lopressor not effective to then give Cardizem  IV.  Lopressor initially dropped HR to 130s, with a small drop in BP of 98/62, so NS bolus started.  At 0415 Hr staying 130 to 140s advised primary RN to go ahead and pull up Cardizem  IV and I placed the order.  At 0420, just as primary nurse had gotten the Cardizem from pharmacy and was pulling up into syringe, pt converted to NSR with rate of 86-92, BP 142/73, so advised primary nurse to stop bolus and resume primary fluid rate already ordered of NS @ 80ml/hr.  Pt received of the bolus.  Pt did not receive any Cardizem.  No current distress.  Stayed in room until 0445 with HR remaining controlled at 85-95 NSR.  Interventions:  Lopressor  IV, NS bolus, pt to remain on cardiac telemetry monitor  Plan of Care (if not transferred):  If pt's HR increases above 120 consistently , notify MD, and call Rapid Response Nurse back at [phone].  If any questions or concerns arise regarding the condition of the patient please call Rapid Response back to see  patient.  Event Summary: Name of Physician Notified: Blount,NP at 0346    at    Outcome: Stayed in room and stabalized  Event End Time: 0420  Ronnald Nian ICU/SD Care Coordinator / Rapid Response Nurse

## 2017-09-19 NOTE — Consult Note (Signed)
Cardiology Consultation:   Patient ID: Robert Fitzgerald; [id number]; 12/15/1941   Admit date: 09/16/2017 Date of Consult: 09/19/2017  Primary Care Provider: [name], MD Primary Cardiologist: Dr. Daiva Nakayama last seen 2014  Primary Electrophysiologist:  NA   Patient Profile:   Robert Fitzgerald is a 76 y.o. male with a hx of SVT at 157 in 2014 who is being seen today for the evaluation of SVT  at the request of Dr. Roda Shutters.  History of Present Illness:   Robert Fitzgerald  a hx of SVT at 157 in 2014 who is being seen today for the evaluation of SVT with previous rare episodes.  Other hx of HTN, GERD, HLD, hyperglycemia admitted 09/16/17 after a fall.  No loss of consciousness slipped and fell in water sustaining non-displaced right intertrochanteric femur fracture.   Underwent Intramedullary nailing, Right intertrochanteric femur fracture on 09/16/17.    Pt was healing and yesterday 09/18/17 AM he went into SVT at 177 and received IV lopressor and IV fluids.  He converted to ST. He had been confused as well.  Last pm HR elevated again.  IV lopressor 5 mg IV given and HR to 145 then 150s. Rapid response called.  IV dilt was to be given but pt converted to SR.    Echo has been ordered.  K+ 3.9 Cr 1.03, WBC 12.0 down from 16.5 yesterday, Hgb 8.3.    EKG on admit SB at 58 and no acute changes, personally reviewed.  EKG 09/18/17 SVT at 177 with ST depression inf last leads with increased HR personally reviewed   EKG 09/19/17 SR with PVCs no acute changes except now PVCs. Personally reviewed   Telemetry:  Telemetry was personally reviewed and demonstrates:  SR with PVCs freq, occ NSVT at 4-5 beats, + SVT   Currently confused, in 4 point restraints.  Denies chest pain but at times cannot understand what he is saying.  Overall resting in bed.   Past Medical History:  Diagnosis Date  . Chronic back pain   . 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 Diverticulitis 1992  . ED  (erectile dysfunction)   . GERD (gastroesophageal reflux disease)   . Headache, post-traumatic, chronic   . Hyperglycemia    Type 2, mild  . Hyperlipidemia    Diet controlled  . Hypertension   . Tinnitus    chronic    Past Surgical History:  Procedure Laterality Date  . BACK SURGERY     x 6- including fusion  . CIRCUMCISION    . FEMUR IM NAIL Right 09/16/2017   Procedure: INTRAMEDULLARY (IM) NAIL FEMORAL;  Surgeon: Ollen Gross, MD;  Location: [facility];  Service: Orthopedics;  Laterality: Right;  . NASAL SEPTUM SURGERY  1999  . SHOULDER SURGERY  1999   2011 for bone spur     Home Medications:  Prior to Admission medications   Medication Sig Start Date End Date Taking? Authorizing Provider  lisinopril (PRINIVIL,ZESTRIL) 5 MG tablet Take 1 tablet (5 mg total) by mouth daily. Patient not taking: Reported on 09/16/2017 12/14/16   Tower, Audrie Gallus, MD  sildenafil (VIAGRA) 100 MG tablet TAKE 1 TABLET BY MOUTH EVERY DAY AS NEEDED FOR ERECTILE DYSFUNCTION Patient not taking: Reported on 09/16/2017 08/23/17   [name], MD    Inpatient Medications: Scheduled Meds: . enoxaparin (LOVENOX) injection  40 mg Subcutaneous Q24H  . metoprolol tartrate  2.5 mg Intravenous Q6H  . QUEtiapine  25 mg Oral BID  . senna-docusate  1 tablet Oral BID   Continuous Infusions: . sodium chloride 75 mL/hr at 09/18/17 1155  . methocarbamol (ROBAXIN)  IV 500 mg (09/17/17 0618)   PRN Meds: acetaminophen **OR** acetaminophen, diphenhydrAMINE, HYDROcodone-acetaminophen, menthol-cetylpyridinium **OR** phenol, methocarbamol **OR** methocarbamol (ROBAXIN)  IV, metoCLOPramide **OR** metoCLOPramide (REGLAN) injection, morphine injection, ondansetron (ZOFRAN) IV, polyethylene glycol, traMADol  Allergies:    Allergies  Allergen Reactions  . Aspirin     REACTION: GI upset  . Codeine Sulfate     REACTION: nausea  . Naproxen Sodium     REACTION: GI upset    Social History:   Social History   Social  History  . Marital status: Married    Spouse name: Olegario Messier  . Number of children: 1  . Years of education: 17   Occupational History  . Retired    Social History Main Topics  . Smoking status: Never Smoker  . Smokeless tobacco: Never Used  . Alcohol use No  . Drug use: No  . Sexual activity: Yes   Other Topics Concern  . Not on file   Social History Narrative   Lives at home with wife.    Caffeine use: Coffee or tea daily   Diet-soda   Left handed    Family History:    Family History  Problem Relation Age of Onset  . Aneurysm Brother        Brain     ROS:  Please see the history of present illness.  ROS from H&P and what pt could answer General:no colds or fevers, no weight changes Skin:no rashes or ulcers HEENT:no blurred vision, no congestion CV:see HPI PUL:see HPI GI:no diarrhea constipation or melena, no indigestion GU:no hematuria, no dysuria MS:+ joint pain, no claudication Neuro:no syncope, no lightheadedness Endo:no diabetes, no thyroid disease   Physical Exam/Data:   Vitals:   09/19/17 0229 09/19/17 0322 09/19/17 0415 09/19/17 0603  BP: 118/74 138/80 (!) 146/86 (!) 154/94  Pulse: (!) 135 (!) 145 91 95  Resp: Temp:  98 F (36.7 C) (!) 97 F (36.1 C) 98.3 F (36.8 C)  TempSrc:  Oral  Oral  SpO2: 100% 96% 97% 100%  Weight:      Height:        Intake/Output Summary (Last 24 hours) at 09/19/17 0939 Last data filed at 09/19/17 9604  Gross per 24 hour  Intake          3573.75 ml  Output             1225 ml  Net          2348.75 ml   Filed Weights   09/16/17 0844 09/16/17 1438  Weight: 220 lb (99.8 kg) 220 lb (99.8 kg)   Body mass index is 28.25 kg/m.  General:  Frail white male in no acute distress, moving in bed with 4 point restraints HEENT: normal Lymph: no adenopathy Neck: no JVD Endocrine:  No thryomegaly Vascular: No carotid bruits; 2+ pedal pulses Cardiac:  normal S1, S2; RRR; no murmur gallup rub or  click. Lungs:  clear to auscultation bilaterally from ant. position, no wheezing, rhonchi or rales  Abd: soft, nontender, no hepatomegaly  Ext: no edema Musculoskeletal:  No deformities, BUE and BLE strength normal and equal Skin: warm and dry  Neuro:  Oriented to person not to place or time.  MAE follows most commands. Psych:  Normal affect   Relevant CV Studies:  Echo pending  Laboratory Data:  Chemistry Recent Labs Lab 09/17/17 0558 09/18/17 0614 09/19/17 0726  NA 139 138 139  K 3.7 3.8 3.9  CL 102 105 109  CO2 GLUCOSE 160* 164* 164*  BUN CREATININE 1.19 1.12 1.03  CALCIUM 8.6* 8.6* 8.1*  GFRNONAA 58* >60 >60  GFRAA >60 >60 >60  ANIONGAP No results for input(s): PROT, ALBUMIN, AST, ALT, ALKPHOS, BILITOT in the last 168 hours. Hematology Recent Labs Lab 09/17/17 0558 09/18/17 0614 09/19/17 0726  WBC 14.9* 16.5* 12.0*  RBC 3.35* 2.88* 2.59*  HGB 10.6* 9.1* 8.3*  HCT 31.8* 27.3* 24.5*  MCV 94.9 94.8 94.6  MCH 31.6 31.6 32.0  MCHC 33.3 33.3 33.9  RDW 14.0 13.9 13.9  PLT 211 175 187   Cardiac EnzymesNo results for input(s): TROPONINI in the last 168 hours. No results for input(s): TROPIPOC in the last 168 hours.  BNPNo results for input(s): BNP, PROBNP in the last 168 hours.  DDimer No results for input(s): DDIMER in the last 168 hours.  Radiology/Studies:  Ct Hip Right Wo Contrast  Result Date: 09/16/2017 CLINICAL DATA:  Right hip pain after fall. EXAM: CT OF THE RIGHT HIP WITHOUT CONTRAST TECHNIQUE: Multidetector CT imaging of the right hip was performed according to the standard protocol. Multiplanar CT image reconstructions were also generated. COMPARISON:  Right hip x-rays from same date. FINDINGS: Bones/Joint/Cartilage There is a comminuted intertrochanteric fracture, with medial and superior displacement of the lesser trochanter. No other fractures identified. No dislocation. Moderate degenerative changes of the right hip and  sacroiliac joints. Osteopenia. Ligaments Suboptimally assessed by CT. Muscles and Tendons No focal abnormality. Soft tissues Moderate amount of hematoma about the fracture site, extending into the adductor compartment. Edema/hematoma is also seen along the proximal superficial femoral vessels. IMPRESSION: Comminuted intertrochanteric fracture with displaced lesser trochanter. Moderate surrounding hematoma extending into the adductor compartment. Electronically Signed   By: Obie Dredge M.D.   On: 09/16/2017 11:34   Chest Portable 1 View  Result Date: 09/16/2017 CLINICAL DATA:  76 year old male status post fall this morning with right hip fracture. EXAM: PORTABLE CHEST 1 VIEW COMPARISON:  06/14/2017. FINDINGS: Portable AP supine view at 1144 hours. Stable lung volumes. Stable cardiac size and mediastinal contours. Visualized tracheal air column is within normal limits. Allowing for portable technique the lungs are clear. No pneumothorax or pleural effusion. IMPRESSION: No acute cardiopulmonary abnormality. Electronically Signed   By: Odessa Fleming M.D.   On: 09/16/2017 12:04   Dg C-arm 1-60 Min-no Report  Result Date: 09/16/2017 Fluoroscopy was utilized by the requesting physician.  No radiographic interpretation.   Dg Hip Port Unilat With Pelvis 1v Right  Result Date: 09/16/2017 CLINICAL DATA:  Right hip surgery EXAM: DG HIP (WITH OR WITHOUT PELVIS) 1V PORT RIGHT COMPARISON:  09/16/2017 FINDINGS: Three low resolution intraoperative spot views of the right hip. The images demonstrate intramedullary rod and distal screw fixation of the proximal right femur for intertrochanteric fracture. Total fluoroscopy time was 31 seconds. IMPRESSION: Intraoperative fluoroscopic assistance provided during surgical fixation of right femoral fracture Electronically Signed   By: Jasmine Pang M.D.   On: 09/16/2017 21:20   Dg Hip Unilat With Pelvis 2-3 Views Right  Result Date: 09/16/2017 CLINICAL DATA:  Fall, right hip  pain EXAM: DG HIP (WITH OR WITHOUT PELVIS) 2-3V RIGHT COMPARISON:  None. FINDINGS: Irregular linear lucency extending from the greater trochanter for towards a  displaced lesser trochanter favors an intertrochanteric right hip fracture. Bilateral hip joint spaces are preserved. Visualized bony pelvis appears intact. Mild degenerative changes of the lower lumbar spine. IMPRESSION: Suspected intertrochanteric right hip fracture with displaced lesser trochanter. Electronically Signed   By: Charline Bills M.D.   On: 09/16/2017 09:50   Dg Femur 1v Right  Result Date: 09/16/2017 CLINICAL DATA:  Fall, right hip pain EXAM: RIGHT FEMUR 1 VIEW COMPARISON:  None. FINDINGS: Suspected nondisplaced intertrochanteric right hip fracture, poorly visualized. Displaced lesser trochanter. Distal femur appears intact. IMPRESSION: Suspected nondisplaced intertrochanteric right hip fracture, poorly visualized. Displaced lesser trochanter. Electronically Signed   By: Charline Bills M.D.   On: 09/16/2017 09:49    Assessment and Plan:   1. SVT with previous hx last known episode in 2014 after aleve. Now with 2 epsidodes in post op period. --on prn IV lopressor 2.5 mg will add every 4 hours. BP is elevated-- no BB prior to admit. --echo pending Dr. Antoine Poche to see.  2.   FX hip post op day 3 for rt hip nailing.   3.   Confusion with hx of closed head injury   4.   HTN was on lisinoril prior to admit needs BB now.   5.  freq PVCs with K+ 3.9 and Mg+ 1.9   6.  Anemia post op - monitor.       For questions or updates, please contact [facility] Please consult [email or web address] for contact info under Cardiology/STEMI.   Signed, Nada Boozer, NP  09/19/2017 9:39 AM  History and all data above reviewed.  Patient examined.  I agree with the findings as above. Events noted and tele reviewed.  He is very confused and can give no history.  He denies pain but just wants to get up and put his clothes on.  Doesn't report  palpitations but would not be reliable in this respect.  He did have SVT on tele.  He has this by history as wellThe patient exam reveals COR:RRR, systolic murmur at the apex, no diastolic murmurs  ,  Lungs: Clear  ,  Abd: Positive bowel sounds, no rebound no guarding, Ext No edema  .  All available labs, radiology testing, previous records reviewed. Agree with documented assessment and plan. SVT:  Agree with scheduled beta blocker.  Not a candidate for any ablation procedures.  Echo pending.  Doubt further intervention or change in therapy will be indicated.  Murmur:  Echo pending.    Fayrene Fearing Para Cossey  12:03 PM  09/19/2017

## 2017-09-19 NOTE — Progress Notes (Signed)
Patient heart rate sustained in 150s despite being given Lopressor earlier. Previously provider on call was notified and stated would monitor. Will page again since HR remains elevated. Patient is resting failry well since being prn earlier. Does have periods when he will wake up and pull at lines and has pulled off condom catheter x2 times since shift change. Will wait for callback.

## 2017-09-19 NOTE — Care Management Note (Signed)
Case Management Note  Patient Details  Name: Robert Fitzgerald MRN: [id number] Date of Birth: 01/08/1941  Subjective/Objective:                  A.fib with rvr and iv cardizem  Action/Plan: Date:  September 19, 2017 Chart reviewed for concurrent status and case management needs.  Will continue to follow patient progress.  Discharge Planning: following for needs  Expected discharge date: [id number]  Marcelle Smiling, BSN, [place], [place]   [phone]    Expected Discharge Date:   (unknown)               Expected Discharge Plan:  Home/Self Care  In-House Referral:     Discharge planning Services  CM Consult  Post Acute Care Choice:    Choice offered to:     DME Arranged:    DME Agency:     HH Arranged:    HH Agency:     Status of Service:  In process, will continue to follow  If discussed at Long Length of Stay Meetings, dates discussed:    Additional Comments:  Golda Acre, RN 09/19/2017, 10:02 AM

## 2017-09-19 NOTE — Progress Notes (Signed)
PT Cancellation Note  Patient Details Name: Robert Fitzgerald MRN: [id number] DOB: [birth year]   Cancelled Treatment:     pt in 4 point restraints, confused.  Will check back another day as schedule permits.  Pt has been evaluated with rec for SNF    Felecia Shelling  PTA [facility]  Acute  Rehab Pager      [phone]

## 2017-09-19 NOTE — Care Management Important Message (Signed)
Important Message  Patient Details  Name: Robert Fitzgerald MRN: [id number] Date of Birth: 30-Jul-1941   Medicare Important Message Given:  Yes    Caren Macadam 09/19/2017, 12:40 PMImportant Message  Patient Details  Name: Robert Fitzgerald MRN: [id number] Date of Birth: May 29, 1941   Medicare Important Message Given:  Yes    Caren Macadam 09/19/2017, 12:40 PM

## 2017-09-19 NOTE — Progress Notes (Signed)
PROGRESS NOTE  Robert Fitzgerald ZOX:[id number] DOB: 04/11/1941 DOA: 09/16/2017 PCP: [name], MD  HPI/Recap of past 24 hours:  Post op day three, still very confused, but is not combative this am  Per RN ,he spitted  out oral meds seroquel this am, he did not take stool softener either  No fever,   He continues to have intermittent SVT episodes after scheduled lopressor started yesterday on 9/23   Assessment/Plan: Principal Problem:   Supracondylar fracture of left humerus without intercondylar fracture Active Problems:   Prostate cancer screening   Hip fracture (HCC)    SVT:  -D/c lisinopril, he is started on scheduled iv lopressor 2.5 q8hrs on 9/23, - increase iv lopressor frequency today, Continue hydration. -k 3.9. Mag 1.9 after iv k and mag given yesterday, will give additional iv k 20 meq and mag 1g today -Recent tsh from 05/2017 was 2. -Echo pending -Cardiology consulted  Post op delirium: -Per chart review , patient was recently evaluated by neurology for memory loss, patient was not oriented to date when he was seen by neurology. Recent MRI brain ordered by neurology showed multiple lacunar infarct. No mention of alcohol use. -Patient likely has baseline vascular dementia. -continue to be confused, seems less combative today, continue prn benadryl , prn haldol, he is stared on seroquel bid -avoid narcotic  - Patient is in four point restraints.  -avoid constipation, bladder scan no urinary retention.  Leukocytosis: wbc 14.9-16.5-12,  -no fever, ua/cxr unremarkable -leukocytosis Likely from stress and dehydration -continue ivf - repeat cbc in am.  Post op anemia:  -hgb 13.8 -10.6-9.1-8.3 -Monitor  Supracondylar fracture of left humerus without intercondylar fracture  -report Mechanical fall at home S/p Intramedullary nailing, Right intertrochanteric femur on 9/21 DVT prophylaxis per ortho, currently on lovenox Management per ortho   HTN:    Hold home meds lisinopril On  iv lopressor due to svt episodes.   Code Status: full  Family Communication: patient   Disposition Plan: SNF once medically stable   Consultants:  Ortho  cardiology  Procedures: Intramedullary nailing, Right intertrochanteric femur on 9/21  Antibiotics:  Perioperative ancef   Objective: BP (!) 154/94 (BP Location: Right Arm)   Pulse 95   Temp 98.3 F (36.8 C) (Oral)   Resp 20   Ht  (1.88 m)   Wt 99.8 kg (220 lb)   SpO2 100%   BMI 28.25 kg/m   Intake/Output Summary (Last 24 hours) at 09/19/17 0745 Last data filed at 09/19/17 8119  Gross per 24 hour  Intake          3453.75 ml  Output             1225 ml  Net          2228.75 ml   Filed Weights   09/16/17 0844 09/16/17 1438  Weight: 99.8 kg (220 lb) 99.8 kg (220 lb)    Exam: Patient is examined daily including today on 09/19/2017, exams remain the same as of yesterday except that has changed    General:  Agitated, confused, in four point restrain   Cardiovascular: sinus tachycardia  Respiratory: CTABL  Abdomen: Soft/ND/NT, positive BS  Musculoskeletal: right hip post op changes, dressing intact, + peripheral pulse.  Neuro: agitated, confused  Data Reviewed: Basic Metabolic Panel:  Recent Labs Lab 09/16/17 0944 09/17/17 0558 09/18/17 0614  NA 140 139 138  K 3.7 3.7 3.8  CL 103 102 105  CO2 28 27 24  GLUCOSE 143* 160* 164*  BUN CREATININE 1.20 1.19 1.12  CALCIUM 9.0 8.6* 8.6*  MG  --   --  1.8   Liver Function Tests: No results for input(s): AST, ALT, ALKPHOS, BILITOT, PROT, ALBUMIN in the last 168 hours. No results for input(s): LIPASE, AMYLASE in the last 168 hours. No results for input(s): AMMONIA in the last 168 hours. CBC:  Recent Labs Lab 09/16/17 0944 09/17/17 0558 09/18/17 0614  WBC 9.8 14.9* 16.5*  NEUTROABS 5.2  --   --   HGB 13.8 10.6* 9.1*  HCT 41.5 31.8* 27.3*  MCV 93.3 94.9 94.8  PLT 231 211 175   Cardiac  Enzymes:   No results for input(s): CKTOTAL, CKMB, CKMBINDEX, TROPONINI in the last 168 hours. BNP (last 3 results) No results for input(s): BNP in the last 8760 hours.  ProBNP (last 3 results) No results for input(s): PROBNP in the last 8760 hours.  CBG:  Recent Labs Lab 09/16/17 2044  GLUCAP 182*    No results found for this or any previous visit (from the past 240 hour(s)).   Studies: No results found.  Scheduled Meds: . enoxaparin (LOVENOX) injection  40 mg Subcutaneous Q24H  . metoprolol tartrate  2.5 mg Intravenous Q6H  . QUEtiapine  25 mg Oral BID  . senna-docusate  1 tablet Oral BID    Continuous Infusions: . sodium chloride 75 mL/hr at 09/18/17 1155  . methocarbamol (ROBAXIN)  IV 500 mg (09/17/17 0618)     Time spent: I have personally reviewed and interpreted on  09/19/2017 daily labs, tele strips, imagings as discussed above under date review session and assessment and plans.  I reviewed all nursing notes, pharmacy notes, consultant notes,  vitals, pertinent old records  I have discussed plan of care as described above with RN , patient  on 09/19/2017   Kayson Bullis MD, PhD  [facility] Pager [phone]. If 7PM-7AM, please contact night-coverage at [email or web address], password [facility] 09/19/2017, 7:45 AM  LOS: 3 days

## 2017-09-19 NOTE — Progress Notes (Signed)
  Echocardiogram 2D Echocardiogram has been performed.  Leta Jungling M 09/19/2017, 3:15 PM

## 2017-09-19 NOTE — Progress Notes (Signed)
Patient's troponin came back at 0.18.  Cardiology NP Annie Paras, and Dr. Roda Shutters made aware.

## 2017-09-19 NOTE — Progress Notes (Signed)
Heart rate sustained 150s despite given Haldol  and Metoprolol  IV per orders. Patient heart rate down to 145 but back up to 150s. Notified house supervisor and Rapid Response to further evaluate patient.

## 2017-09-19 NOTE — Progress Notes (Signed)
CSW left voicemail for patient daughter to discuss d/c plan . CSW unable to contact spouse.   Vivi Barrack, Theresia Majors, MSW Clinical Social Worker 5E and Psychiatric Service Line [phone] 09/19/2017  11:26 AM

## 2017-09-20 LAB — CBC WITH DIFFERENTIAL/PLATELET
BASOS ABS: 0 10*3/uL (ref 0.0–0.1)
BASOS PCT: 0 %
Eosinophils Absolute: 0 10*3/uL (ref 0.0–0.7)
Eosinophils Relative: 0 %
HEMATOCRIT: 26.3 % — AB (ref 39.0–52.0)
HEMOGLOBIN: 9.1 g/dL — AB (ref 13.0–17.0)
LYMPHS PCT: 17 %
Lymphs Abs: 2 10*3/uL (ref 0.7–4.0)
MCH: 32.2 pg (ref 26.0–34.0)
MCHC: 34.6 g/dL (ref 30.0–36.0)
MCV: 92.9 fL (ref 78.0–100.0)
MONO ABS: 1.5 10*3/uL — AB (ref 0.1–1.0)
MONOS PCT: 13 %
NEUTROS ABS: 7.8 10*3/uL — AB (ref 1.7–7.7)
NEUTROS PCT: 70 %
Platelets: 259 10*3/uL (ref 150–400)
RBC: 2.83 MIL/uL — ABNORMAL LOW (ref 4.22–5.81)
RDW: 13.5 % (ref 11.5–15.5)
WBC: 11.3 10*3/uL — ABNORMAL HIGH (ref 4.0–10.5)

## 2017-09-20 LAB — COMPREHENSIVE METABOLIC PANEL
ALBUMIN: 3.3 g/dL — AB (ref 3.5–5.0)
ALK PHOS: 62 U/L (ref 38–126)
ALT: 23 U/L (ref 17–63)
AST: 52 U/L — AB (ref 15–41)
Anion gap: 11 (ref 5–15)
BILIRUBIN TOTAL: 1.2 mg/dL (ref 0.3–1.2)
BUN: 14 mg/dL (ref 6–20)
CALCIUM: 8.5 mg/dL — AB (ref 8.9–10.3)
CO2: 25 mmol/L (ref 22–32)
CREATININE: 0.93 mg/dL (ref 0.61–1.24)
Chloride: 103 mmol/L (ref 101–111)
GFR calc Af Amer: >60 mL/min (ref >60)
GLUCOSE: 142 mg/dL — AB (ref 65–99)
POTASSIUM: 3.8 mmol/L (ref 3.5–5.1)
Sodium: 139 mmol/L (ref 135–145)
TOTAL PROTEIN: 6.8 g/dL (ref 6.5–8.1)

## 2017-09-20 LAB — MAGNESIUM: MAGNESIUM: 2.1 mg/dL (ref 1.7–2.4)

## 2017-09-20 MED ORDER — ENSURE ENLIVE PO LIQD
237.0000 mL | Freq: Two times a day (BID) | ORAL | Status: DC
  Administered 2017-09-22 – 2017-09-25 (×5): 237 mL via ORAL

## 2017-09-20 MED ORDER — DIPHENHYDRAMINE HCL 50 MG/ML IJ SOLN
25.0000 mg | Freq: Four times a day (QID) | INTRAMUSCULAR | Status: DC | PRN
  Administered 2017-09-20 – 2017-09-24 (×6): 25 mg via INTRAVENOUS
  Filled 2017-09-20 (×6): qty 1

## 2017-09-20 MED ORDER — METOPROLOL TARTRATE 5 MG/5ML IV SOLN
5.0000 mg | INTRAVENOUS | Status: DC
  Administered 2017-09-20 – 2017-09-22 (×11): 5 mg via INTRAVENOUS
  Filled 2017-09-20 (×11): qty 5

## 2017-09-20 NOTE — Progress Notes (Signed)
Initial Nutrition Assessment  DOCUMENTATION CODES:   Severe malnutrition in context of acute illness/injury  INTERVENTION:  Ensure Enlive po BID, each supplement provides 350 kcal and 20 grams of protein  Recommend receiving new weight reading   NUTRITION DIAGNOSIS:   Malnutrition (severe) related to acute illness (confusion, fall, s/p surgical intervention) as evidenced by moderate depletions of muscle mass, moderate depletion of body fat.  GOAL:   Patient will meet greater than or equal to 90% of their needs  MONITOR:   PO intake, Supplement acceptance, Diet advancement, I & O's, Weight trends  REASON FOR ASSESSMENT:   Low Braden    ASSESSMENT:   Pt with PMH of GERD, HTN, hyperglycemia, dyslipidemia presented s/p fall found supracondylar fracture of left humerus requiring surgical intervention of intramedullary nailing 09/16/17.   Pt in 4 point restraints. Pt unable to answer RD questions at time of visit.   Per chart review pt has had a fluctuation of PO intake this admission ranging from 0% to 1 meal 100% completed. Unsure of eating habits PTA given pt's current cognitive status.   Nutrition focused physical exam completed. Findings include moderate fat depletions, moderate muscle depletions and no edema.   Labs reviewed Medications reviewed; Dulcolax, Senokot-S, PRN Miralax, PRN Zofran   Diet Order:  Diet full liquid Room service appropriate? Yes; Fluid consistency: Thin  Skin:  Wound (see comment) (incision at R leg)  Last BM:  09/19/17  Height:   Ht Readings from Last 1 Encounters:  09/16/17  (1.88 m)    Weight:   Wt Readings from Last 1 Encounters:  09/16/17 220 lb (99.8 kg)    Ideal Body Weight:  86.4 kg  BMI:  Body mass index is 28.25 kg/m.  Estimated Nutritional Needs:   Kcal:  2300-2500  Protein:  120-130 grams  Fluid:  >/= 2.3 L/d  EDUCATION NEEDS:   Education needs no appropriate at this time  Fransisca Kaufmann, MS, RDN,  LDN 09/20/2017 2:43 PM

## 2017-09-20 NOTE — NC FL2 (Signed)
[place] MEDICAID FL2 LEVEL OF CARE SCREENING TOOL     IDENTIFICATION  Patient Name: Robert Fitzgerald Birthdate: 1941-06-26 Sex: male Admission Date (Current Location): 09/16/2017  [facility] and [place] Number:  Producer, television/film/video and Address:  [facility],  [address], [place] [address]      Provider Number: [id number]  Attending Physician Name and Address:  Albertine Grates, MD  Relative Name and Phone Number:       Current Level of Care: Hospital Recommended Level of Care: Skilled Nursing Facility Prior Approval Number:    Date Approved/Denied:   PASRR Number: [id number]  Discharge Plan: SNF    Current Diagnoses: Patient Active Problem List   Diagnosis Date Noted  . Supracondylar fracture of left humerus without intercondylar fracture 09/16/2017  . Hip fracture (HCC) 09/16/2017  . Closed fracture of right hip (HCC)   . Confusion 06/14/2017  . Short-term memory loss 06/14/2017  . Cough 06/14/2017  . History of recent fall 12/14/2016  . Need for hepatitis C screening test 12/10/2016  . Prostate cancer screening 12/09/2016  . Overuse of medication 05/04/2016  . Medicare annual wellness visit, initial 11/04/2015  . Leukocytosis 10/09/2014  . Hyperglycemia 08/11/2011  . MEMORY LOSS 07/11/2008  . Essential hypertension 02/01/2008  . Chronic back pain 02/01/2008  . HYPERCHOLESTEROLEMIA 01/31/2008  . ERECTILE DYSFUNCTION 01/31/2008  . TINNITUS, CHRONIC 01/31/2008  . HIATAL HERNIA WITH REFLUX 01/31/2008  . HEAD TRAUMA, CLOSED 01/31/2008    Orientation RESPIRATION BLADDER Height & Weight     Self  Normal Incontinent Weight: 220 lb (99.8 kg) Height:   (188 cm)  BEHAVIORAL SYMPTOMS/MOOD NEUROLOGICAL BOWEL NUTRITION STATUS  Verbally abusive   Continent Diet  AMBULATORY STATUS COMMUNICATION OF NEEDS Skin   Extensive Assist Verbally Normal (Incision )                       Personal Care Assistance Level of Assistance  Bathing, Feeding,  Dressing Bathing Assistance: Limited assistance Feeding assistance: Independent Dressing Assistance: Limited assistance     Functional Limitations Info  Sight, Hearing, Speech Sight Info: Adequate Hearing Info: Impaired Speech Info: Adequate    SPECIAL CARE FACTORS FREQUENCY  PT (By licensed PT), OT (By licensed OT)     PT Frequency: 5x/week  OT Frequency: 5x/week            Contractures Contractures Info: Not present    Additional Factors Info  Code Status, Allergies Code Status Info: Fullcode Allergies Info: Aspirin, Codeine Sulfate, Naproxen Sodium           Current Medications (09/20/2017):  This is the current hospital active medication list Current Facility-Administered Medications  Medication Dose Route Frequency Provider Last Rate Last Dose  . 0.9 %  sodium chloride infusion   Intravenous Continuous Richarda Overlie, MD 75 mL/hr at 09/18/17 1155    . acetaminophen (TYLENOL) tablet 650 mg  650 mg Oral Q6H PRN Aluisio, Homero Fellers, MD       Or  . acetaminophen (TYLENOL) suppository 650 mg  650 mg Rectal Q6H PRN Aluisio, Homero Fellers, MD      . bisacodyl (DULCOLAX) suppository 10 mg  10 mg Rectal Daily Albertine Grates, MD   10 mg at 09/19/17 1526  . diphenhydrAMINE (BENADRYL) injection 25 mg  25 mg Intravenous Q6H PRN Albertine Grates, MD   25 mg at 09/20/17 8413  . enoxaparin (LOVENOX) injection 40 mg  40 mg Subcutaneous Q24H Richarda Overlie, MD   40  mg at 09/19/17 1525  . haloperidol lactate (HALDOL) injection 1 mg  1 mg Intravenous Q6H PRN Albertine Grates, MD   1 mg at 09/20/17 0252  . HYDROcodone-acetaminophen (NORCO/VICODIN) 5-325 MG per tablet 1-2 tablet  1-2 tablet Oral Q6H PRN Richarda Overlie, MD   2 tablet at 09/20/17 0937  . menthol-cetylpyridinium (CEPACOL) lozenge 3 mg  1 lozenge Oral PRN Aluisio, Homero Fellers, MD       Or  . phenol (CHLORASEPTIC) mouth spray 1 spray  1 spray Mouth/Throat PRN Aluisio, Homero Fellers, MD      . methocarbamol (ROBAXIN) tablet 500 mg  500 mg Oral Q6H PRN Ollen Gross, MD   500  mg at 09/17/17 2150   Or  . methocarbamol (ROBAXIN) 500 mg in dextrose 5 % 50 mL IVPB  500 mg Intravenous Q6H PRN Ollen Gross, MD   Stopped at 09/19/17 [phone]  . metoCLOPramide (REGLAN) tablet 5-10 mg  5-10 mg Oral Q8H PRN Aluisio, Homero Fellers, MD       Or  . metoCLOPramide (REGLAN) injection 5-10 mg  5-10 mg Intravenous Q8H PRN Aluisio, Homero Fellers, MD      . metoprolol tartrate (LOPRESSOR) injection 2.5 mg  2.5 mg Intravenous Q4H Leone Brand, NP   2.5 mg at 09/20/17 0736  . ondansetron (ZOFRAN) injection 4 mg  4 mg Intravenous Q6H PRN Richarda Overlie, MD   4 mg at 09/16/17 1956  . polyethylene glycol (MIRALAX / GLYCOLAX) packet 17 g  17 g Oral Daily PRN Richarda Overlie, MD      . QUEtiapine (SEROQUEL) tablet 25 mg  25 mg Oral BID Albertine Grates, MD   25 mg at 09/20/17 9604  . senna-docusate (Senokot-S) tablet 1 tablet  1 tablet Oral BID Albertine Grates, MD      . traMADol Janean Sark) tablet 50-100 mg  50-100 mg Oral Q6H PRN Ollen Gross, MD   50 mg at 09/18/17 2040     Discharge Medications: Please see discharge summary for a list of discharge medications.  Relevant Imaging Results:  Relevant Lab Results:   Additional Information ssn: [phone]  Clearance Coots, LCSW

## 2017-09-20 NOTE — Progress Notes (Signed)
Subjective: 4 Days Post-Op Procedure(s) (LRB): INTRAMEDULLARY (IM) NAIL FEMORAL (Right) Still restrained in bed. Sleeping and awakens to stimuli but goes right back to sleep   Objective: Vital signs in last 24 hours: Temp:  [98 F (36.7 C)-99 F (37.2 C)] 98.4 F (36.9 C) (09/25 0414) Pulse Rate:  [90-98] 95 (09/25 0414) Resp:  [20] 20 (09/25 0414) BP: (153-181)/(81-97) 161/81 (09/25 0414) SpO2:  [96 %-100 %] 100 % (09/25 0414)  Intake/Output from previous day: 09/24 0701 - 09/25 0700 In: 1841.3 [P.O.:240; I.Fitzgerald.:1346.3; IV Piggyback:255] Out: 3325 [Urine:3325] Intake/Output this shift: Total I/O In: 10 [P.O.:10] Out: -    Recent Labs  09/18/17 0614 09/19/17 0726 09/20/17 0628  HGB 9.1* 8.3* 9.1*    Recent Labs  09/19/17 0726 09/20/17 0628  WBC 12.0* 11.3*  RBC 2.59* 2.83*  HCT 24.5* 26.3*  PLT 187 259    Recent Labs  09/19/17 0726 09/20/17 0628  NA 139 139  K 3.9 3.8  CL 109 103  CO2 22 25  BUN 18 14  CREATININE 1.03 0.93  GLUCOSE 164* 142*  CALCIUM 8.1* 8.5*   No results for input(s): LABPT, INR in the last 72 hours. Bruising around incision. No drainage or erythema. Thigh soft. Mild swelling Hemoglobin stable  Assessment/Plan: 4 Days Post-Op Procedure(s) (LRB): INTRAMEDULLARY (IM) NAIL FEMORAL (Right) Up with therapy  Will require SNF once mental status stabilizes  Robert Fitzgerald 09/20/2017, 11:11 AM

## 2017-09-20 NOTE — Progress Notes (Signed)
PROGRESS NOTE  Robert Fitzgerald VWU:[id number] DOB: 1941-08-06 DOA: 09/16/2017 PCP: [name], MD  HPI/Recap of past 24 hours:  Post op day four, still very confused, not combative   Per RN ,he spitted  out oral meds at times,   No fever,   No SVT  Last 24hrs   Assessment/Plan: Principal Problem:   Supracondylar fracture of left humerus without intercondylar fracture Active Problems:   Prostate cancer screening   Hip fracture (HCC)    SVT:  -IV lopressor  q4hr per cardiology recommendation, d/c hydration (lvef 35-40%) -k 3.8, mag 2.1 today -Recent tsh from 05/2017 was 2. -Echo LVEF 35-40% -Cardiology following  Post op delirium: -Per chart review , patient was recently evaluated by neurology for memory loss, patient was not oriented to date when he was seen by neurology. Recent MRI brain done on 8/28 ordered by neurology showed multiple lacunar infarct.  -No mention of alcohol use. -Patient likely has baseline vascular dementia. -continue to be confused, seems less combative , continue prn benadryl , prn haldol, he is started on seroquel bid -avoid narcotic  - Patient is in four point restraints.  -avoid constipation, bladder scan no urinary retention.  Leukocytosis: wbc 14.9-16.05-07-10.3,  -no fever, ua/cxr unremarkable -leukocytosis Likely from stress and dehydration -received ivf ,d/c ivf on 9/25   Post op anemia:  -hgb 13.8 -10.6-9.1-8.3-9.1 -Monitor  Supracondylar fracture of left humerus without intercondylar fracture  -report Mechanical fall at home S/p Intramedullary nailing, Right intertrochanteric femur on 9/21 DVT prophylaxis per ortho, currently on lovenox Management per ortho   HTN:  Hold home meds lisinopril,  he dose not take oral meds consistently  On  iv lopressor due to svt episodes.   Severe malnutrition in context of acute illness/injury Nutrition input appreciated,   Code Status: full  Family Communication: patient    Disposition Plan: SNF once medically stable   Consultants:  Ortho  cardiology  Procedures: Intramedullary nailing, Right intertrochanteric femur on 9/21  Antibiotics:  Perioperative ancef   Objective: BP (!) 161/81 (BP Location: Left Arm)   Pulse 95   Temp 98.4 F (36.9 C) (Oral)   Resp 20   Ht  (1.88 m)   Wt 99.8 kg (220 lb)   SpO2 100%   BMI 28.25 kg/m   Intake/Output Summary (Last 24 hours) at 09/20/17 1808 Last data filed at 09/20/17 1613  Gross per 24 hour  Intake              745 ml  Output             2800 ml  Net            -2055 ml   Filed Weights   09/16/17 0844 09/16/17 1438  Weight: 99.8 kg (220 lb) 99.8 kg (220 lb)    Exam: Patient is examined daily including today on 09/20/2017, exams remain the same as of yesterday except that has changed    General:  remain confused, in four point restrain   Cardiovascular: RRR  Respiratory: CTABL  Abdomen: Soft/ND/NT, positive BS  Musculoskeletal: right hip post op changes, dressing intact, + peripheral pulse.  Neuro:  confused  Data Reviewed: Basic Metabolic Panel:  Recent Labs Lab 09/16/17 0944 09/17/17 0558 09/18/17 0614 09/19/17 0726 09/20/17 0628  NA 140 139 138 139 139  K 3.7 3.7 3.8 3.9 3.8  CL 103 102 105 109 103  CO2 GLUCOSE 143* 160* 164*  164* 142*  BUN CREATININE 1.20 1.19 1.12 1.03 0.93  CALCIUM 9.0 8.6* 8.6* 8.1* 8.5*  MG  --   --  1.8 1.9 2.1   Liver Function Tests:  Recent Labs Lab 09/20/17 0628  AST 52*  ALT 23  ALKPHOS 62  BILITOT 1.2  PROT 6.8  ALBUMIN 3.3*   No results for input(s): LIPASE, AMYLASE in the last 168 hours.  Recent Labs Lab 09/19/17 0726  AMMONIA 27   CBC:  Recent Labs Lab 09/16/17 0944 09/17/17 0558 09/18/17 0614 09/19/17 0726 09/20/17 0628  WBC 9.8 14.9* 16.5* 12.0* 11.3*  NEUTROABS 5.2  --   --   --  7.8*  HGB 13.8 10.6* 9.1* 8.3* 9.1*  HCT 41.5 31.8* 27.3* 24.5* 26.3*  MCV 93.3 94.9  94.8 94.6 92.9  PLT 231 211 175 187 259   Cardiac Enzymes:    Recent Labs Lab 09/19/17 1424 09/19/17 1617 09/19/17 2319  TROPONINI 0.18* 0.19* 0.19*   BNP (last 3 results) No results for input(s): BNP in the last 8760 hours.  ProBNP (last 3 results) No results for input(s): PROBNP in the last 8760 hours.  CBG:  Recent Labs Lab 09/16/17 2044  GLUCAP 182*    No results found for this or any previous visit (from the past 240 hour(s)).   Studies: No results found.  Scheduled Meds: . bisacodyl  10 mg Rectal Daily  . enoxaparin (LOVENOX) injection  40 mg Subcutaneous Q24H  . feeding supplement (ENSURE ENLIVE)  237 mL Oral BID BM  . metoprolol tartrate  2.5 mg Intravenous Q4H  . QUEtiapine  25 mg Oral BID  . senna-docusate  1 tablet Oral BID    Continuous Infusions: . sodium chloride 75 mL/hr at 09/18/17 1155  . methocarbamol (ROBAXIN)  IV Stopped (09/19/17 5621)     Time spent: I have personally reviewed and interpreted on  09/20/2017 daily labs, tele strips, imagings as discussed above under date review session and assessment and plans.  I reviewed all nursing notes, pharmacy notes, consultant notes,  vitals, pertinent old records  I have discussed plan of care as described above with RN , patient  on 09/20/2017   Jarred Purtee MD, PhD  [facility] Pager [phone]. If 7PM-7AM, please contact night-coverage at [email or web address], password [facility] 09/20/2017, 6:08 PM  LOS: 4 days

## 2017-09-20 NOTE — Progress Notes (Signed)
Progress Note  Patient Name: Robert Fitzgerald Date of Encounter: 09/20/2017  Primary Cardiologist: Dr. Daiva Fitzgerald   Subjective   Confused, cannot understand what he is saying, in 4 point restraints  Inpatient Medications    Scheduled Meds: . bisacodyl  10 mg Rectal Daily  . enoxaparin (LOVENOX) injection  40 mg Subcutaneous Q24H  . metoprolol tartrate  2.5 mg Intravenous Q4H  . QUEtiapine  25 mg Oral BID  . senna-docusate  1 tablet Oral BID   Continuous Infusions: . sodium chloride 75 mL/hr at 09/18/17 1155  . methocarbamol (ROBAXIN)  IV Stopped (09/19/17 0748)   PRN Meds: acetaminophen **OR** acetaminophen, haloperidol lactate, HYDROcodone-acetaminophen, menthol-cetylpyridinium **OR** phenol, methocarbamol **OR** methocarbamol (ROBAXIN)  IV, metoCLOPramide **OR** metoCLOPramide (REGLAN) injection, ondansetron (ZOFRAN) IV, polyethylene glycol, traMADol   Vital Signs    Vitals:   09/19/17 1555 09/19/17 2140 09/19/17 2330 09/20/17 0414  BP: (!) 171/86 (!) 181/81 (!) 153/97 (!) 161/81  Pulse: 90 98 98 95  Resp: Temp: 98 F (36.7 C) 98.3 F (36.8 C) 99 F (37.2 C) 98.4 F (36.9 C)  TempSrc: Oral Oral Axillary Oral  SpO2: 99% 96% 99% 100%  Weight:      Height:        Intake/Output Summary (Last 24 hours) at 09/20/17 0838 Last data filed at 09/20/17 0416  Gross per 24 hour  Intake          1601.25 ml  Output             2900 ml  Net         -1298.75 ml   Filed Weights   09/16/17 0844 09/16/17 1438  Weight: 220 lb (99.8 kg) 220 lb (99.8 kg)    Telemetry    SR with PVCs, short burst of Non Sustained VT no further SVT- Personally Reviewed  ECG    No new - Personally Reviewed  Physical Exam   GEN: No acute distress.   Neck: No JVD Cardiac: RRR, no murmurs, rubs, or gallops.  Respiratory: Clear to auscultation bilaterally.ant.  GI: Soft, nontender, non-distended  MS: No edema; No deformity. Neuro:  confused, not answering questions  today, and I have more trouble understanding speech today.  4 point restraints.   Psych: Normal affect   Labs    Chemistry Recent Labs Lab 09/18/17 [phone] 09/19/17 0726 09/20/17 0628  NA 138 139 139  K 3.8 3.9 3.8  CL 105 109 103  CO2 GLUCOSE 164* 164* 142*  BUN CREATININE 1.12 1.03 0.93  CALCIUM 8.6* 8.1* 8.5*  PROT  --   --  6.8  ALBUMIN  --   --  3.3*  AST  --   --  52*  ALT  --   --  23  ALKPHOS  --   --  62  BILITOT  --   --  1.2  GFRNONAA >60 >60 >60  GFRAA >60 >60 >60  ANIONGAP Hematology Recent Labs Lab 09/18/17 0614 09/19/17 0726 09/20/17 0628  WBC 16.5* 12.0* 11.3*  RBC 2.88* 2.59* 2.83*  HGB 9.1* 8.3* 9.1*  HCT 27.3* 24.5* 26.3*  MCV 94.8 94.6 92.9  MCH 31.6 32.0 32.2  MCHC 33.3 33.9 34.6  RDW 13.9 13.9 13.5  PLT 175 187 259    Cardiac Enzymes Recent Labs Lab 09/19/17 1424 09/19/17 1617 09/19/17 2319  TROPONINI 0.18* 0.19* 0.19*  No results for input(s): TROPIPOC in the last 168 hours.   BNPNo results for input(s): BNP, PROBNP in the last 168 hours.   DDimer No results for input(s): DDIMER in the last 168 hours.   Radiology    No results found.  Cardiac Studies   Echo 09/19/17 Study Conclusions  - Left ventricle: The cavity size was normal. Wall thickness was   normal. Systolic function was moderately reduced. The estimated   ejection fraction was in the range of 35% to 40%. Diffuse   hypokinesis. Doppler parameters are consistent with abnormal left   ventricular relaxation (grade 1 diastolic dysfunction). - Aortic valve: Trileaflet; moderately calcified leaflets. There   was mild stenosis. Mean gradient (S): 13 mm Hg. - Mitral valve: Mildly calcified annulus. There was no significant   regurgitation. - Left atrium: The atrium was mildly dilated. - Right ventricle: The cavity size was normal. Systolic function   was normal. - Pulmonary arteries: No complete TR doppler jet so unable to   estimate  PA systolic pressure. - Inferior vena cava: The vessel was normal in size. The   respirophasic diameter changes were in the normal range (>= 50%),   consistent with normal central venous pressure.  Impressions:  - Technically difficult study with poor acoustic windows. Normal LV   size with EF 35-40%. Diffuse hypokinesis. Normal RV size and   systolic function. Mild aortic stenosis.   Patient Profile     76 y.o. male with a hx of SVT at 157 in 2014 who was admitted for fx hip and rec'd Rt hip IM nailing and has since developed episodic SVT.    Assessment & Plan    SVT with previous hx last known episode in 2014 after aleve. Now with 2 epsidodes in post op period. --on prn IV lopressor 2.5 mg will add every 4 hours. BP is elevated-- no BB prior to admit. --could increase to 5 mg every 4 hours with HTN     Cardiomyopathy - elevated troponin mild and flat 0.18; 0.19; 0.19 , which may be from demand ischemia with SVT. Was on ACE prior to admit, will need to resume   FX hip post op day 4 for rt hip nailing. Per Ortho  Confusion with hx of closed head injury   HTN was on lisinoril prior to admit needs BB now. + BP elevated Add ACE back as well if taking po  freq PVCs with K+ 3.9 and Mg+ 1.9    Anemia post op - monitor.        For questions or updates, please contact [facility] Please consult [email or web address] for contact info under Cardiology/STEMI.      Signed, Robert Boozer, NP  09/20/2017, 8:38 AM     History and all data above reviewed.  Patient examined.  I agree with the findings as above.  Minimally responsive in the room.  Unable to answer questions.  Tele has been reviewed.  The patient exam reveals COR:RRR  ,  Lungs: Decreased breath sounds  ,  Abd: Positive bowel sounds, no rebound no guarding, Ext No edema  .  All available labs, radiology testing, previous records reviewed. Agree with documented assessment and plan. SVT episodes with wide complex noted.   Medical management only.  It does not currently look like he is taking any POs.   Not clear what the plan of care is or the expectations are for this patient after discharge.      Robert Fitzgerald Due  11:47  AM  09/20/2017

## 2017-09-20 NOTE — Progress Notes (Signed)
PT Cancellation Note  Patient Details Name: Robert Fitzgerald MRN: [id number] DOB: 03-01-1941   Cancelled Treatment:     pt unable to participate due to cognition.  Remains in 4 point restraints.  Pt has been evaluated with rec for SNF.   Felecia Shelling  PTA [facility]  Acute  Rehab Pager      [phone]

## 2017-09-21 LAB — CBC
HCT: 26 % — ABNORMAL LOW (ref 39.0–52.0)
HEMOGLOBIN: 9 g/dL — AB (ref 13.0–17.0)
MCH: 31.6 pg (ref 26.0–34.0)
MCHC: 34.6 g/dL (ref 30.0–36.0)
MCV: 91.2 fL (ref 78.0–100.0)
Platelets: 306 10*3/uL (ref 150–400)
RBC: 2.85 MIL/uL — ABNORMAL LOW (ref 4.22–5.81)
RDW: 13.3 % (ref 11.5–15.5)
WBC: 8.9 10*3/uL (ref 4.0–10.5)

## 2017-09-21 LAB — COMPREHENSIVE METABOLIC PANEL
ALBUMIN: 3.1 g/dL — AB (ref 3.5–5.0)
ALT: 37 U/L (ref 17–63)
ANION GAP: 12 (ref 5–15)
AST: 68 U/L — ABNORMAL HIGH (ref 15–41)
Alkaline Phosphatase: 59 U/L (ref 38–126)
BUN: 16 mg/dL (ref 6–20)
CO2: 23 mmol/L (ref 22–32)
Calcium: 8.5 mg/dL — ABNORMAL LOW (ref 8.9–10.3)
Chloride: 104 mmol/L (ref 101–111)
Creatinine, Ser: 0.86 mg/dL (ref 0.61–1.24)
GFR calc Af Amer: >60 mL/min (ref >60)
GFR calc non Af Amer: >60 mL/min (ref >60)
GLUCOSE: 133 mg/dL — AB (ref 65–99)
POTASSIUM: 3.6 mmol/L (ref 3.5–5.1)
SODIUM: 139 mmol/L (ref 135–145)
Total Bilirubin: 1.5 mg/dL — ABNORMAL HIGH (ref 0.3–1.2)
Total Protein: 6.4 g/dL — ABNORMAL LOW (ref 6.5–8.1)

## 2017-09-21 MED ORDER — BISACODYL 10 MG RE SUPP: 10.0000 mg | Freq: Every day | RECTAL | Status: DC | PRN

## 2017-09-21 NOTE — Progress Notes (Signed)
Progress Note  Patient Name: Robert Fitzgerald Date of Encounter: 09/21/2017  Primary Cardiologist: Dr. Antoine Fitzgerald  Subjective   Pt in 4-pt restraints with garbled speech. Seems to have mildly labored breathing  Inpatient Medications    Scheduled Meds: . enoxaparin (LOVENOX) injection  40 mg Subcutaneous Q24H  . feeding supplement (ENSURE ENLIVE)  237 mL Oral BID BM  . metoprolol tartrate  5 mg Intravenous Q4H  . QUEtiapine  25 mg Oral BID  . senna-docusate  1 tablet Oral BID   Continuous Infusions: . methocarbamol (ROBAXIN)  IV Stopped (09/19/17 0748)   PRN Meds: acetaminophen **OR** acetaminophen, diphenhydrAMINE, haloperidol lactate, HYDROcodone-acetaminophen, menthol-cetylpyridinium **OR** phenol, methocarbamol **OR** methocarbamol (ROBAXIN)  IV, metoCLOPramide **OR** metoCLOPramide (REGLAN) injection, ondansetron (ZOFRAN) IV, polyethylene glycol, traMADol   Vital Signs    Vitals:   09/19/17 2330 09/20/17 0414 09/20/17 2233 09/21/17 0628  BP: (!) 153/97 (!) 161/81 (!) 159/81 (!) 149/85  Pulse: 98 95 93 92  Resp: Temp: 99 F (37.2 C) 98.4 F (36.9 C) 97.8 F (36.6 C) 98.8 F (37.1 C)  TempSrc: Axillary Oral Oral Oral  SpO2: 99% 100% 100% 100%  Weight:      Height:        Intake/Output Summary (Last 24 hours) at 09/21/17 1039 Last data filed at 09/21/17 1001  Gross per 24 hour  Intake              120 ml  Output             1925 ml  Net            -1805 ml   Filed Weights   09/16/17 0844 09/16/17 1438  Weight: 220 lb (99.8 kg) 220 lb (99.8 kg)     Physical Exam   General: male, 4-pt restraints, difficult speech Head: Normocephalic, atraumatic.  Neck: Supple without bruits, no JVD. Lungs:  Respirations seem labored, exam difficult with restraints in place Heart: RRR, no murmurs. Abdomen: Soft, non-tender, non-distended with normoactive bowel sounds. No hepatomegaly. No rebound/guarding. No obvious abdominal masses. Extremities: No  clubbing, cyanosis, trace edema. Distal pedal pulses are faint bilaterally. Neuro: awake, unable to assess orientation given difficult speech Psych: Normal affect.  Labs    Chemistry Recent Labs Lab 09/19/17 0726 09/20/17 0628 09/21/17 0458  NA 139 139 139  K 3.9 3.8 3.6  CL 109 103 104  CO2 GLUCOSE 164* 142* 133*  BUN CREATININE 1.03 0.93 0.86  CALCIUM 8.1* 8.5* 8.5*  PROT  --  6.8 6.4*  ALBUMIN  --  3.3* 3.1*  AST  --  52* 68*  ALT  --  23 37  ALKPHOS  --  62 59  BILITOT  --  1.2 1.5*  GFRNONAA >60 >60 >60  GFRAA >60 >60 >60  ANIONGAP Hematology Recent Labs Lab 09/19/17 0726 09/20/17 0628 09/21/17 0458  WBC 12.0* 11.3* 8.9  RBC 2.59* 2.83* 2.85*  HGB 8.3* 9.1* 9.0*  HCT 24.5* 26.3* 26.0*  MCV 94.6 92.9 91.2  MCH 32.0 32.2 31.6  MCHC 33.9 34.6 34.6  RDW 13.9 13.5 13.3  PLT 187 259 306    Cardiac Enzymes Recent Labs Lab 09/19/17 1424 09/19/17 1617 09/19/17 2319  TROPONINI 0.18* 0.19* 0.19*   No results for input(s): TROPIPOC in the last 168 hours.   BNPNo results for input(s): BNP, PROBNP in the last 168 hours.  DDimer No results for input(s): DDIMER in the last 168 hours.   Radiology    No results found.   Telemetry    sinus - Personally Reviewed  ECG    No new tracings - Personally Reviewed   Cardiac Studies   Echo 09/19/17 Study Conclusions - Left ventricle: The cavity size was normal. Wall thickness was normal. Systolic function was moderately reduced. The estimated ejection fraction was in the range of 35% to 40%. Diffuse hypokinesis. Doppler parameters are consistent with abnormal left ventricular relaxation (grade 1 diastolic dysfunction). - Aortic valve: Trileaflet; moderately calcified leaflets. There was mild stenosis. Mean gradient (S): 13 mm Hg. - Mitral valve: Mildly calcified annulus. There was no significant regurgitation. - Left atrium: The atrium was mildly dilated. -  Right ventricle: The cavity size was normal. Systolic function was normal. - Pulmonary arteries: No complete TR doppler jet so unable to estimate PA systolic pressure. - Inferior vena cava: The vessel was normal in size. The respirophasic diameter changes were in the normal range (>= 50%), consistent with normal central venous pressure.  Impressions: - Technically difficult study with poor acoustic windows. Normal LV size with EF 35-40%. Diffuse hypokinesis. Normal RV size and systolic function. Mild aortic stenosis.   Patient Profile     76 y.o. male with a hx of SVT at 157 in 2014who was admitted for fx hip and rec'd Rt hip IM nailing and has since developed episodic SVT.  Assessment & Plan    1. SVT - lopressor 5 mg IV q4hr PRN - has received 4 injections overnight and today - currently sinus rhythm in the 90s  2. Chronic combined systolic and diastolic heart failure - mildly elevated and flat troponin thought to be related to demand ischemia 2/ SVT - resume ACEI at discharge - pt does not appear volume overloaded - weight stable at 220 lbs - weight at 08/09/17 OP clinic visit was 172 lbs - this difference likely reflects bed weights  3. Confusion - hx of closed head injury  4. HTN - sBP 140-160s - add back ACEI as needed    Signed, Robert Fitzgerald , PA-C 10:39 AM 09/21/2017 Pager: [phone]  History and all data above reviewed.  Patient examined.  I agree with the findings as above.  He is talking but very confused.  No obvious cardiac complaints.  The patient exam reveals COR:RRR  ,  Lungs: Clear  ,  Abd: Positive bowel sounds, no rebound no guarding, Ext No edema  .  All available labs, radiology testing, previous records reviewed. Agree with documented assessment and plan. SVT:  Continue PRN beta blocker.  We will follow as needed.     Robert Fitzgerald  2:10 PM  09/21/2017

## 2017-09-21 NOTE — Progress Notes (Addendum)
PROGRESS NOTE  Robert Fitzgerald ZOX:[id number] DOB: 1941/10/29 DOA: 09/16/2017   PCP: [name], MD  Subjective: Pt still confused but able to follow commands, no concerns this AM.   Assessment/Plan:  SVT - in NSR this AM, keep on lopressor as needed for now - ECHO with EF 35-40% - cardiology following   Post op delirium, vascular dementia  - Recent MRI brain done on 8/28 ordered by neurology showed multiple lacunar infarct.  - pt remains confused and requiring 4 points restraints . - supportive care, minimize distractions   Chronic combined systolic and diastolic heart failure - no signs of volume overload on exam this AM - can resume ACEI on discharge  - weight trend not monitored, will ask RN to check weight this AM Filed Weights   09/16/17 0844 09/16/17 1438  Weight: 99.8 kg (220 lb) 99.8 kg (220 lb)  - weight in OP clinic on 8/14 was 172 lbs but not clear if true baseline weight   Leukocytosis - suspected to be reactive - resolved   Post op anemia - no evidence of active bleeding  - CBC in AM  R femur fracture - report Mechanical fall at home - S/p Intramedullary nailing, Right intertrochanteric femur on 9/21 - DVT prophylaxis per ortho, currently on Lovenox   HTN - resume ACEI on discharge   Severe malnutrition in context of acute illness/injury - appreciate nutritionist following   Code Status: full  Family Communication: patient   Disposition Plan: SNF in 1-2 days    Consultants:  Ortho  cardiology  Procedures:  Intramedullary nailing, Right intertrochanteric femur on 9/21  Antibiotics:  Perioperative ancef   Objective: BP (!) 149/85 (BP Location: Right Arm)   Pulse 92   Temp 98.8 F (37.1 C) (Oral)   Resp 18   Ht  (1.88 m)   Wt 99.8 kg (220 lb)   SpO2 100%   BMI 28.25 kg/m   Intake/Output Summary (Last 24 hours) at 09/21/17 1134 Last data filed at 09/21/17 1001  Gross per 24 hour  Intake              120 ml    Output             1925 ml  Net            -1805 ml   Filed Weights   09/16/17 0844 09/16/17 1438  Weight: 99.8 kg (220 lb) 99.8 kg (220 lb)   Physical Exam  Constitutional: Appears calm, NAD, confused  CVS: RRR, S1/S2 +, no gallops, no carotid bruit.  Pulmonary: Effort and breath sounds labored but no use of accessory muscles  Abdominal: Soft. BS +,  no distension, tenderness, rebound or guarding.  Musculoskeletal: Normal range of motion. No edema and no tenderness.   Data Reviewed: Basic Metabolic Panel:  Recent Labs Lab 09/17/17 0558 09/18/17 0614 09/19/17 0726 09/20/17 0628 09/21/17 0458  NA 139 138 139 139 139  K 3.7 3.8 3.9 3.8 3.6  CL 102 105 109 103 104  CO2 GLUCOSE 160* 164* 164* 142* 133*  BUN CREATININE 1.19 1.12 1.03 0.93 0.86  CALCIUM 8.6* 8.6* 8.1* 8.5* 8.5*  MG  --  1.8 1.9 2.1  --    Liver Function Tests:  Recent Labs Lab 09/20/17 0628 09/21/17 0458  AST 52* 68*  ALT 23 37  ALKPHOS 62 59  BILITOT 1.2 1.5*  PROT  6.8 6.4*  ALBUMIN 3.3* 3.1*    Recent Labs Lab 09/19/17 0726  AMMONIA 27   CBC:  Recent Labs Lab 09/16/17 0944 09/17/17 0558 09/18/17 0614 09/19/17 0726 09/20/17 0628 09/21/17 0458  WBC 9.8 14.9* 16.5* 12.0* 11.3* 8.9  NEUTROABS 5.2  --   --   --  7.8*  --   HGB 13.8 10.6* 9.1* 8.3* 9.1* 9.0*  HCT 41.5 31.8* 27.3* 24.5* 26.3* 26.0*  MCV 93.3 94.9 94.8 94.6 92.9 91.2  PLT 231 211 175 187 259 306   Cardiac Enzymes:    Recent Labs Lab 09/19/17 1424 09/19/17 1617 09/19/17 2319  TROPONINI 0.18* 0.19* 0.19*   CBG:  Recent Labs Lab 09/16/17 2044  GLUCAP 182*    Studies: No results found.  Scheduled Meds: . enoxaparin (LOVENOX) injection  40 mg Subcutaneous Q24H  . feeding supplement (ENSURE ENLIVE)  237 mL Oral BID BM  . metoprolol tartrate  5 mg Intravenous Q4H  . QUEtiapine  25 mg Oral BID  . senna-docusate  1 tablet Oral BID    Continuous Infusions: . methocarbamol  (ROBAXIN)  IV Stopped (09/19/17 0748)    Time spent: 25 minutes  I have personally reviewed and interpreted on  09/21/2017 daily labs, tele strips, imagings as discussed above under date review session and assessment and plans.  I have discussed plan of care as described above with RN , patient  on 09/21/2017  Debbora Presto MD [facility] Pager [phone]. If 7PM-7AM, please contact night-coverage at [email or web address], password [facility] 09/21/2017, 11:34 AM  LOS: 5 days

## 2017-09-21 NOTE — Progress Notes (Signed)
Physical Therapy Treatment Patient Details Name: Robert Fitzgerald MRN: [id number] DOB: [birth year] Today's Date: 09/21/2017    History of Present Illness 76 year old male with a history of closed head trauma, gastroesophageal reflux disease, hyperglycemia, dyslipidemia, hypertension who presents to the ED after a fall . Patient went down to the basement of his home to get his lawnmower, when he slipped and fell in the standing water in his basement. Patient was brought in by his wife who called EMS.  Patient denies hitting his head or loss of consciousness; pt has  R femur fx, s/p IM nail per Dr. Lequita Halt on 09/16/17    PT Comments    Pt in bed difficult to arouse.  Sternal rub and VC's by pt's first name to briefly open eyes.  Poor tracking.  + 2 Total Assist to transfer pt from supine to EOB to increase engagement.  Pt resistant and very strong.  Once upright, pt was able to static sit  EOB x 12 min while waiting for +2 assist.  Pt watching staff walk past room and looking around.  Oriented to current place but quickly forgotten. Student nurse came in to do vitals.  Pt demonstrated increased alertness with increased activity.   Indicated R hip pain.  Words are random with broken sentences.  Pt did answer to simple questions: Are you hurting?  Are you thirsty? Pt able to tell me his wife name and that he worked on [employer].  Pt able to hold and drink some OJ.   Shaky and too big of a sip.  RN arrived and assisted with getting pt to recliner.  Unable to take functional steps and unable to tolerate much weight thru R LE.  Perfomed transfer only.  Positioned in recliner upright with multiple pillows.  Rigid and stiff throughout.   Pt will need ST Rehab at SNF prior to returning home.   Follow Up Recommendations  SNF     Equipment Recommendations  None recommended by PT    Recommendations for Other Services       Precautions / Restrictions Precautions Precautions:  Fall Restrictions Weight Bearing Restrictions: No Other Position/Activity Restrictions: WBAT    Mobility  Bed Mobility Overal bed mobility: Needs Assistance Bed Mobility: Supine to Sit     Supine to sit: +2 for physical assistance;+2 for safety/equipment;Total assist     General bed mobility comments: assist for trunk and LEs, incr time, multi-modal cues to participate and stay on task    pt resistant   Once upright EOB pt was able to static sit at Supervision level x 12 min while waiting for + 2 assist  Transfers Overall transfer level: Needs assistance Equipment used: None Transfers: Sit to/from Stand;Stand Pivot Transfers Sit to Stand: +2 physical assistance;+2 safety/equipment;From elevated surface;Max assist;Total assist Stand pivot transfers: Max assist;Total assist;+2 physical assistance;+2 safety/equipment;From elevated surface       General transfer comment: + 2 side by side hand held assist.  Pt resistant at first but once upright able to static stand x 2 min.  Great difficulty tolerating WBing thru R LE to complete 1/4 pivot to recliner.  + 2 Total Assist to perform stand to sit/pt resistant  Ambulation/Gait             General Gait Details: NT/unable other than pivotal  steps to chair   Stairs            Wheelchair Mobility    Modified Rankin (Stroke Patients Only)  Balance                                            Cognition Arousal/Alertness: Awake/alert Behavior During Therapy: Restless Overall Cognitive Status: Impaired/Different from baseline Area of Impairment: Orientation;Attention;Memory;Following commands;Safety/judgement;Awareness;Problem solving                 Orientation Level: Disoriented to;Place;Time;Situation     Following Commands: Follows one step commands inconsistently Safety/Judgement: Decreased awareness of safety;Decreased awareness of deficits   Problem Solving: Slow  processing;Decreased initiation;Difficulty sequencing;Requires verbal cues;Requires tactile cues General Comments: pt family reports his mentation is clear at baseline per eval      Exercises      General Comments        Pertinent Vitals/Pain Pain Assessment: Faces Faces Pain Scale: Hurts little more Pain Location: R hip with activity Pain Descriptors / Indicators: Sore;Grimacing;Operative site guarding Pain Intervention(s): Monitored during session;Repositioned    Home Living                      Prior Function            PT Goals (current goals can now be found in the care plan section) Progress towards PT goals: Progressing toward goals    Frequency    Min 3X/week      PT Plan Current plan remains appropriate    Co-evaluation              AM-PAC PT "6 Clicks" Daily Activity  Outcome Measure  Difficulty turning over in bed (including adjusting bedclothes, sheets and blankets)?: Unable Difficulty moving from lying on back to sitting on the side of the bed? : Unable Difficulty sitting down on and standing up from a chair with arms (e.g., wheelchair, bedside commode, etc,.)?: Unable Help needed moving to and from a bed to chair (including a wheelchair)?: Total Help needed walking in hospital room?: Total Help needed climbing 3-5 steps with a railing? : Total 6 Click Score: 6    End of Session Equipment Utilized During Treatment: Gait belt Activity Tolerance: Other (comment) (limited by cognition/awareness) Patient left: in bed;with call bell/phone within reach;with nursing/sitter in room Nurse Communication: Mobility status (RN present and assisted with OOB to recliner) PT Visit Diagnosis: Difficulty in walking, not elsewhere classified (R26.2)     Time: 1610-9604 PT Time Calculation (min) (ACUTE ONLY): 44 min  Charges:  $Gait Training: 8-22 mins $Therapeutic Activity: 23-37 mins                    G Codes:       Felecia Shelling  PTA [facility]   Acute  Rehab Pager      [phone]

## 2017-09-22 LAB — CBC
HEMATOCRIT: 28.5 % — AB (ref 39.0–52.0)
HEMOGLOBIN: 9.7 g/dL — AB (ref 13.0–17.0)
MCH: 31 pg (ref 26.0–34.0)
MCHC: 34 g/dL (ref 30.0–36.0)
MCV: 91.1 fL (ref 78.0–100.0)
Platelets: 416 10*3/uL — ABNORMAL HIGH (ref 150–400)
RBC: 3.13 MIL/uL — AB (ref 4.22–5.81)
RDW: 13.4 % (ref 11.5–15.5)
WBC: 10.3 10*3/uL (ref 4.0–10.5)

## 2017-09-22 LAB — BASIC METABOLIC PANEL
Anion gap: 12 (ref 5–15)
BUN: 16 mg/dL (ref 6–20)
CHLORIDE: 100 mmol/L — AB (ref 101–111)
CO2: 27 mmol/L (ref 22–32)
Calcium: 8.9 mg/dL (ref 8.9–10.3)
Creatinine, Ser: 0.88 mg/dL (ref 0.61–1.24)
GFR calc Af Amer: >60 mL/min (ref >60)
GFR calc non Af Amer: >60 mL/min (ref >60)
Glucose, Bld: 134 mg/dL — ABNORMAL HIGH (ref 65–99)
POTASSIUM: 3.2 mmol/L — AB (ref 3.5–5.1)
SODIUM: 139 mmol/L (ref 135–145)

## 2017-09-22 MED ORDER — POTASSIUM CHLORIDE 20 MEQ/15ML (10%) PO SOLN
40.0000 meq | Freq: Once | ORAL | Status: AC
  Administered 2017-09-22: 40 meq via ORAL
  Filled 2017-09-22: qty 30

## 2017-09-22 MED ORDER — METOPROLOL TARTRATE 25 MG PO TABS
25.0000 mg | ORAL_TABLET | Freq: Four times a day (QID) | ORAL | Status: DC
  Administered 2017-09-22 – 2017-09-23 (×5): 25 mg via ORAL
  Filled 2017-09-22 (×5): qty 1

## 2017-09-22 MED ORDER — POTASSIUM CHLORIDE CRYS ER 20 MEQ PO TBCR
40.0000 meq | EXTENDED_RELEASE_TABLET | Freq: Once | ORAL | Status: DC
Start: 2017-09-22 — End: 2017-09-22

## 2017-09-22 MED ORDER — METOPROLOL TARTRATE 5 MG/5ML IV SOLN: 2.5000 mg | INTRAVENOUS | Status: DC | PRN

## 2017-09-22 NOTE — Progress Notes (Signed)
Progress Note  Patient Name: Robert Fitzgerald Date of Encounter: 09/22/2017  Primary Cardiologist: Dr. Antoine Poche  Subjective   Pt sleeping in chair, not in restraints. While mostly asleep, speech was coherent. When awake, speech is garbled. Pt was able to participate in the exam.  Inpatient Medications    Scheduled Meds: . enoxaparin (LOVENOX) injection  40 mg Subcutaneous Q24H  . feeding supplement (ENSURE ENLIVE)  237 mL Oral BID BM  . metoprolol tartrate  5 mg Intravenous Q4H  . potassium chloride  40 mEq Oral Once  . QUEtiapine  25 mg Oral BID  . senna-docusate  1 tablet Oral BID   Continuous Infusions: . methocarbamol (ROBAXIN)  IV Stopped (09/19/17 0748)   PRN Meds: acetaminophen **OR** acetaminophen, bisacodyl, diphenhydrAMINE, haloperidol lactate, HYDROcodone-acetaminophen, menthol-cetylpyridinium **OR** phenol, methocarbamol **OR** methocarbamol (ROBAXIN)  IV, metoCLOPramide **OR** metoCLOPramide (REGLAN) injection, ondansetron (ZOFRAN) IV, polyethylene glycol, traMADol   Vital Signs    Vitals:   09/21/17 0628 09/21/17 1541 09/21/17 2158 09/22/17 0600  BP: (!) 149/85 (!) 136/94 132/83 (!) 164/82  Pulse: 92 98 80 76  Resp: Temp: 98.8 F (37.1 C) 97.8 F (36.6 C) 98.2 F (36.8 C) 97.8 F (36.6 C)  TempSrc: Oral Oral Oral Oral  SpO2: 100% 100% 99% 99%  Weight:   174 lb 2.6 oz (79 kg)   Height:        Intake/Output Summary (Last 24 hours) at 09/22/17 1157 Last data filed at 09/22/17 0601  Gross per 24 hour  Intake              300 ml  Output             1250 ml  Net             -950 ml   Filed Weights   09/16/17 0844 09/16/17 1438 09/21/17 2158  Weight: 220 lb (99.8 kg) 220 lb (99.8 kg) 174 lb 2.6 oz (79 kg)     Physical Exam   General: Well developed, well nourished, male appearing in no acute distress. Head: Normocephalic, atraumatic.  Neck: Supple without bruits, JVD. Lungs:  Resp regular and unlabored, CTA. Heart: RRR, S1, S2,  no murmur; no rub. Abdomen: Soft, non-tender, non-distended with normoactive bowel sounds. No hepatomegaly. No rebound/guarding. No obvious abdominal masses. Extremities: No clubbing, cyanosis, trace edema. Distal pedal pulses are 2+ bilaterally. Neuro: Alert and oriented X 3. Moves all extremities spontaneously. Psych: Normal affect.  Labs    Chemistry Recent Labs Lab 09/20/17 [phone] 09/21/17 0458 09/22/17 0708  NA 139 139 139  K 3.8 3.6 3.2*  CL 103 104 100*  CO2 GLUCOSE 142* 133* 134*  BUN CREATININE 0.93 0.86 0.88  CALCIUM 8.5* 8.5* 8.9  PROT 6.8 6.4*  --   ALBUMIN 3.3* 3.1*  --   AST 52* 68*  --   ALT 23 37  --   ALKPHOS 62 59  --   BILITOT 1.2 1.5*  --   GFRNONAA >60 >60 >60  GFRAA >60 >60 >60  ANIONGAP Hematology Recent Labs Lab 09/20/17 0628 09/21/17 0458 09/22/17 0708  WBC 11.3* 8.9 10.3  RBC 2.83* 2.85* 3.13*  HGB 9.1* 9.0* 9.7*  HCT 26.3* 26.0* 28.5*  MCV 92.9 91.2 91.1  MCH 32.2 31.6 31.0  MCHC 34.6 34.6 34.0  RDW 13.5 13.3 13.4  PLT 259 306 416*    Cardiac  Enzymes Recent Labs Lab 09/19/17 1424 09/19/17 1617 09/19/17 2319  TROPONINI 0.18* 0.19* 0.19*   No results for input(s): TROPIPOC in the last 168 hours.   BNPNo results for input(s): BNP, PROBNP in the last 168 hours.   DDimer No results for input(s): DDIMER in the last 168 hours.   Radiology    No results found.   Telemetry    Sinus in the 90s, bouts of SVT n the 150-160s responsive to IV lopressor - Personally Reviewed  ECG    No new tracings - Personally Reviewed  Cardiac Studies   Echo 09/19/17 Study Conclusions - Left ventricle: The cavity size was normal. Wall thickness was normal. Systolic function was moderately reduced. The estimated ejection fraction was in the range of 35% to 40%. Diffuse hypokinesis. Doppler parameters are consistent with abnormal left ventricular relaxation (grade 1 diastolic dysfunction). - Aortic  valve: Trileaflet; moderately calcified leaflets. There was mild stenosis. Mean gradient (S): 13 mm Hg. - Mitral valve: Mildly calcified annulus. There was no significant regurgitation. - Left atrium: The atrium was mildly dilated. - Right ventricle: The cavity size was normal. Systolic function was normal. - Pulmonary arteries: No complete TR doppler jet so unable to estimate PA systolic pressure. - Inferior vena cava: The vessel was normal in size. The respirophasic diameter changes were in the normal range (>= 50%), consistent with normal central venous pressure.  Impressions: - Technically difficult study with poor acoustic windows. Normal LV size with EF 35-40%. Diffuse hypokinesis. Normal RV size and systolic function. Mild aortic stenosis.   Patient Profile     76 y.o. male with a hx of SVT at 89 in 2014who was admitted for fx hip and rec'd Rt hip IM nailing and has since developed episodic SVT.  Assessment & Plan    1. SVT - lopressor 5 mg IV q4hr PRN - continues with bouts of SVT in the 150-160s, lopressor is working to convert back to [employer] - consider increasing lopressor to q3hr PRN for better coverage for breakthrough SVT - currently sinus in the 80s - mildly elevated and flat troponin thought to be demand ischemia 2/ SVT  2. Chronic combined systolic and diastolic heart failure - resume ACEI at discharge - pt does not appear volume overloaded - weight is now 174 lbs from 220 - likely related to bed weights; last OP clinic visit was 172 lbs  3. Confusion - hx of closed head injury - currently not in restraints  4. HTN - sBP continues 140-160s - will continue to monitor as lopressor is titrated   Signed, Robert Fitzgerald , PA-C 11:57 AM 09/22/2017 Pager: [phone]   History and all data above reviewed.  Patient examined.  I agree with the findings as above.  He always has his eyes closed when I am talking to him but he is answering  some questions.  Obviously confused.  I spoke with the nurse who said that he is able to take PO meds.  The patient exam reveals COR:RRR  ,  Lungs: Decreased breath sounds  ,  Abd: Positive bowel sounds, no rebound no guarding, Ext No edema  .  All available labs, radiology testing, previous records reviewed. Agree with documented assessment and plan. SVT:  I would like to try to start PO beta blocker and use IV as PRN.  I will order this.   Robert Fitzgerald  3:14 PM  09/22/2017

## 2017-09-22 NOTE — Care Management Note (Signed)
Case Management Note  Patient Details  Name: Hiep Ollis MRN: [id number] Date of Birth: 08-13-1941  Subjective/Objective:                  Runs of svt iv lopressor Action/Plan: Date:  September 22, 2017 Chart reviewed for concurrent status and case management needs.  Will continue to follow patient progress.  Discharge Planning: following for needs  Expected discharge date: [id number]  Marcelle Smiling, BSN, [place], [place]   [phone]   Expected Discharge Date:   (unknown)               Expected Discharge Plan:  Home/Self Care  In-House Referral:     Discharge planning Services  CM Consult  Post Acute Care Choice:    Choice offered to:     DME Arranged:    DME Agency:     HH Arranged:    HH Agency:     Status of Service:  In process, will continue to follow  If discussed at Long Length of Stay Meetings, dates discussed:    Additional Comments:  Golda Acre, RN 09/22/2017, 1:14 PM

## 2017-09-22 NOTE — Progress Notes (Addendum)
PROGRESS NOTE  Robert Fitzgerald ZOX:[id number] DOB: 10-Aug-1941 DOA: 09/16/2017   PCP: [name], MD  Subjective: Pt more alert, answers most of the questions and follows commands, intermittently confused.   Assessment/Plan: SVT - in NSR this AM, keep on lopressor per cardiology  - ECHO with EF 35-40% - cardiology following, appreciate assistance   Post op delirium, vascular dementia  - Recent MRI brain done on 8/28 ordered by neurology showed multiple lacunar infarct.  - still with mittens but appears calm this AM - minimize distractions   Chronic combined systolic and diastolic heart failure - no signs of volume overload on exam this AM - can resume ACEI on discharge  - weight trend not monitored, will ask RN to check weight this AM Filed Weights   09/16/17 0844 09/16/17 1438 09/21/17 2158  Weight: 99.8 kg (220 lb) 99.8 kg (220 lb) 79 kg (174 lb 2.6 oz)  - weight in OP clinic on 8/14 was 172 lbs but not clear if true baseline weight   Leukocytosis - suspected to be reactive - resolved   Hypokalemia - supplement and repeat BMP in AM  Post op anemia - no evidence of active bleeding - CBC in AM  R femur fracture  - report Mechanical fall at home - S/p Intramedullary nailing, Right intertrochanteric femur on 9/21 - DVT prophylaxis per ortho, on Lovenox   HTN - resume ACEI on discharge   Severe malnutrition in context of acute illness/injury - appreciate nutritionist following   Code Status: full Family Communication: patient  Disposition Plan: SNF in AM  Consultants:  Ortho  cardiology  Procedures:  Intramedullary nailing, Right intertrochanteric femur on 9/21  Antibiotics:  Perioperative ancef  Objective: BP (!) 146/89 (BP Location: Left Arm)   Pulse 80   Temp 98.3 F (36.8 C) (Oral)   Resp 16   Ht  (1.88 m)   Wt 79 kg (174 lb 2.6 oz)   SpO2 100%   BMI 22.36 kg/m   Intake/Output Summary (Last 24 hours) at 09/22/17 1733 Last  data filed at 09/22/17 0601  Gross per 24 hour  Intake              240 ml  Output             1250 ml  Net            -1010 ml   Filed Weights   09/16/17 0844 09/16/17 1438 09/21/17 2158  Weight: 99.8 kg (220 lb) 99.8 kg (220 lb) 79 kg (174 lb 2.6 oz)   Physical Exam  Constitutional: Appears calm, NAD CVS: RRR, S1/S2 +, no gallops, no carotid bruit.  Pulmonary: Effort and breath sounds normal, no stridor, rhonchi, wheezes, rales.  Abdominal: Soft. BS +,  no distension, tenderness, rebound or guarding.  Neuro: Alert. Normal reflexes, muscle tone coordination.  Data Reviewed: Basic Metabolic Panel:  Recent Labs Lab 09/18/17 0614 09/19/17 0726 09/20/17 0628 09/21/17 0458 09/22/17 0708  NA 138 139 139 139 139  K 3.8 3.9 3.8 3.6 3.2*  CL 105 109 103 104 100*  CO2 GLUCOSE 164* 164* 142* 133* 134*  BUN CREATININE 1.12 1.03 0.93 0.86 0.88  CALCIUM 8.6* 8.1* 8.5* 8.5* 8.9  MG 1.8 1.9 2.1  --   --    Liver Function Tests:  Recent Labs Lab 09/20/17 0628 09/21/17 0458  AST 52* 68*  ALT 23 37  ALKPHOS 62 59  BILITOT 1.2 1.5*  PROT 6.8 6.4*  ALBUMIN 3.3* 3.1*    Recent Labs Lab 09/19/17 0726  AMMONIA 27   CBC:  Recent Labs Lab 09/16/17 0944  09/18/17 0614 09/19/17 0726 09/20/17 0628 09/21/17 0458 09/22/17 0708  WBC 9.8  < > 16.5* 12.0* 11.3* 8.9 10.3  NEUTROABS 5.2  --   --   --  7.8*  --   --   HGB 13.8  < > 9.1* 8.3* 9.1* 9.0* 9.7*  HCT 41.5  < > 27.3* 24.5* 26.3* 26.0* 28.5*  MCV 93.3  < > 94.8 94.6 92.9 91.2 91.1  PLT 231  < > 175 187 259 306 416*  < > = values in this interval not displayed. Cardiac Enzymes:    Recent Labs Lab 09/19/17 1424 09/19/17 1617 09/19/17 2319  TROPONINI 0.18* 0.19* 0.19*   CBG:  Recent Labs Lab 09/16/17 2044  GLUCAP 182*   Studies: No results found.  Scheduled Meds: . enoxaparin (LOVENOX) injection  40 mg Subcutaneous Q24H  . feeding supplement (ENSURE ENLIVE)  237 mL Oral  BID BM  . metoprolol tartrate  25 mg Oral QID  . QUEtiapine  25 mg Oral BID  . senna-docusate  1 tablet Oral BID   Continuous Infusions: . methocarbamol (ROBAXIN)  IV Stopped (09/19/17 0748)   Time spent: 25 minutes  I have personally reviewed and interpreted on  09/22/2017 daily labs, tele strips, imagings as discussed above under date review session and assessment and plans.  I have discussed plan of care as described above with RN , patient  on 09/22/2017  Debbora Presto MD [facility] Pager [phone]. If 7PM-7AM, please contact night-coverage at [email or web address], password [facility] 09/22/2017, 5:33 PM  LOS: 6 days

## 2017-09-22 NOTE — Clinical Social Work Placement (Addendum)
2:44 PM Bed offers given to Daughter Deana via phone. List provided as requested and highlighted bed offers and faiclity in between daughter and mother. 2 offers family is looking at:  Teacher, adult education and [place].  Information to reach CSW has been given and left for daughter to call and make decision. WIll follow up.  Daughter plans to review facilities tomorrow. Made her aware dc in next 1-2 days pending clinical condition.  Deretha Emory, MSW Clinical Social Work: System Wide Float Coverage for :  [phone]  CLINICAL SOCIAL WORK PLACEMENT  NOTE  Date:  09/22/2017  Patient Details  Name: Robert Fitzgerald MRN: [id number] Date of Birth: August 21, 1941  Clinical Social Work is seeking post-discharge placement for this patient at the Skilled  Nursing Facility level of care (*CSW will initial, date and re-position this form in  chart as items are completed):  Yes   Patient/family provided with [facility] Clinical Social Work Department's list of facilities offering this level of care within the geographic area requested by the patient (or if unable, by the patient's family).  Yes   Patient/family informed of their freedom to choose among providers that offer the needed level of care, that participate in Medicare, Medicaid or managed care program needed by the patient, have an available bed and are willing to accept the patient.  Yes   Patient/family informed of [facility]'s ownership interest in [facility] and [facility], as well as of the fact that they are under no obligation to receive care at these facilities.  PASRR submitted to EDS on 09/22/17     PASRR number received on 09/22/17     Existing PASRR number confirmed on       FL2 transmitted to all facilities in geographic area requested by pt/family on 09/22/17     FL2 transmitted to all facilities within larger geographic area on       Patient informed that his/her managed care company has contracts with or  will negotiate with certain facilities, including the following:        Yes   Patient/family informed of bed offers received.  Patient chooses bed at    [facility]   Physician recommends and patient chooses bed at      Patient to be transferred to   on  . 09/24/2017  Patient to be transferred to facility by     [facility]  Patient family notified on   of transfer.  Name of family member notified:    Daughter, will fill out paperwork at 11:OOam    09/23/2017  PHYSICIAN Please sign FL2     Additional Comment:    _______________________________________________ Raye Sorrow, LCSW 09/22/2017, 2:44 PM

## 2017-09-22 NOTE — Progress Notes (Signed)
Physical Therapy Treatment Patient Details Name: Robert Fitzgerald MRN: [id number] DOB: 01-11-1941 Today's Date: 09/22/2017    History of Present Illness   76 year old male with a history of closed head trauma, gastroesophageal reflux disease, hyperglycemia, dyslipidemia, hypertension who presents to the ED after a fall . Patient went down to the basement of his home to get his lawnmower, when he slipped and fell in the standing water in his basement. Patient was brought in by his wife who called EMS.  Patient denies hitting his head or loss of consciousness; pt has  R femur fx, s/p IM nail per Dr. Lequita Halt on 09/16/17    PT Comments    POD # 6 Pt more alert but still cofused to current situation.  Assisted OOB to amb a limited distance using B platform EVA walker and + 2 assist.  Pt required 75% VC's and repeat direction.  Great difficulty functionally stepping.  Difficulty tolerating WBing thru R LE.  Avg HR during act was 107.  Recliner closely following behind.     Follow Up Recommendations  SNF     Equipment Recommendations  None recommended by PT    Recommendations for Other Services       Precautions / Restrictions Precautions Precautions: Fall Restrictions Weight Bearing Restrictions: No Other Position/Activity Restrictions: WBAT    Mobility  Bed Mobility Overal bed mobility: Needs Assistance Bed Mobility: Supine to Sit     Supine to sit: +2 for physical assistance;+2 for safety/equipment;Max assist     General bed mobility comments: 50% VC's to increase self participation and use of bed pad to complete.  Pt more cooperative but still cognition impaired but NOT resistant this time.  Once upright, pt able to static sit inde  Transfers Overall transfer level: Needs assistance Equipment used: None Transfers: Sit to/from Stand Sit to Stand: +2 physical assistance;+2 safety/equipment;From elevated surface;Max assist         General transfer comment: + 2 side by  assist to rise with hand over hand direction  Ambulation/Gait Ambulation/Gait assistance: Max assist;+2 physical assistance;+2 safety/equipment Ambulation Distance (Feet): 6 Feet Assistive device: Bilateral platform walker (EVA walker ) Gait Pattern/deviations: Step-to pattern;Narrow base of support;Decreased step length - right;Decreased step length - left;Decreased weight shift to right Gait velocity: decreased   General Gait Details: used EVA walker for increased support + 2 assist side by side then a third assist to follow with recliner.  Very rigid, unsteady gait with great difficulty advancing either LE and limited WBing tolerance thru R LE due to pain.     Stairs            Wheelchair Mobility    Modified Rankin (Stroke Patients Only)       Balance                                            Cognition Arousal/Alertness: Awake/alert Behavior During Therapy:  (more alert ) Overall Cognitive Status: Impaired/Different from baseline Area of Impairment: Orientation;Attention;Memory;Following commands;Safety/judgement;Awareness;Problem solving                 Orientation Level: Disoriented to;Place;Time;Situation     Following Commands: Follows one step commands consistently Safety/Judgement: Decreased awareness of safety;Decreased awareness of deficits   Problem Solving: Slow processing;Decreased initiation;Difficulty sequencing;Requires verbal cues;Requires tactile cues General Comments: more alert but still confused and not at baseline  Exercises      General Comments        Pertinent Vitals/Pain Pain Assessment: Faces Faces Pain Scale: Hurts little more Pain Location: R hip with activity Pain Descriptors / Indicators: Sore;Grimacing;Operative site guarding Pain Intervention(s): Monitored during session;Repositioned    Home Living                      Prior Function            PT Goals (current goals can now  be found in the care plan section) Progress towards PT goals: Progressing toward goals    Frequency    Min 3X/week      PT Plan Current plan remains appropriate    Co-evaluation              AM-PAC PT "6 Clicks" Daily Activity  Outcome Measure  Difficulty turning over in bed (including adjusting bedclothes, sheets and blankets)?: Unable Difficulty moving from lying on back to sitting on the side of the bed? : Unable Difficulty sitting down on and standing up from a chair with arms (e.g., wheelchair, bedside commode, etc,.)?: Unable Help needed moving to and from a bed to chair (including a wheelchair)?: Total Help needed walking in hospital room?: Total Help needed climbing 3-5 steps with a railing? : Total 6 Click Score: 6    End of Session Equipment Utilized During Treatment: Gait belt Activity Tolerance: Other (comment) (limited by cognition/pain) Patient left: in chair;with call bell/phone within reach Nurse Communication: Need for lift equipment;Mobility status PT Visit Diagnosis: Difficulty in walking, not elsewhere classified (R26.2)     Time: 1610-9604 PT Time Calculation (min) (ACUTE ONLY): 25 min  Charges:  $Gait Training: 8-22 mins $Therapeutic Activity: 8-22 mins                    G Codes:       Felecia Shelling  PTA [facility]  Acute  Rehab Pager      [phone]

## 2017-09-23 DIAGNOSIS — I10 Essential (primary) hypertension: Secondary | ICD-10-CM

## 2017-09-23 DIAGNOSIS — S72001D Fracture of unspecified part of neck of right femur, subsequent encounter for closed fracture with routine healing: Secondary | ICD-10-CM

## 2017-09-23 DIAGNOSIS — I429 Cardiomyopathy, unspecified: Secondary | ICD-10-CM

## 2017-09-23 LAB — CBC
HEMATOCRIT: 29.8 % — AB (ref 39.0–52.0)
HEMOGLOBIN: 10 g/dL — AB (ref 13.0–17.0)
MCH: 30.7 pg (ref 26.0–34.0)
MCHC: 33.6 g/dL (ref 30.0–36.0)
MCV: 91.4 fL (ref 78.0–100.0)
Platelets: 475 10*3/uL — ABNORMAL HIGH (ref 150–400)
RBC: 3.26 MIL/uL — ABNORMAL LOW (ref 4.22–5.81)
RDW: 13.4 % (ref 11.5–15.5)
WBC: 9.4 10*3/uL (ref 4.0–10.5)

## 2017-09-23 LAB — BASIC METABOLIC PANEL
Anion gap: 11 (ref 5–15)
BUN: 23 mg/dL — ABNORMAL HIGH (ref 6–20)
CHLORIDE: 103 mmol/L (ref 101–111)
CO2: 27 mmol/L (ref 22–32)
CREATININE: 0.96 mg/dL (ref 0.61–1.24)
Calcium: 9.3 mg/dL (ref 8.9–10.3)
GFR calc non Af Amer: >60 mL/min (ref >60)
Glucose, Bld: 126 mg/dL — ABNORMAL HIGH (ref 65–99)
Potassium: 3.7 mmol/L (ref 3.5–5.1)
Sodium: 141 mmol/L (ref 135–145)

## 2017-09-23 LAB — MAGNESIUM: Magnesium: 2.1 mg/dL (ref 1.7–2.4)

## 2017-09-23 MED ORDER — ACETAMINOPHEN 325 MG PO TABS: 650.0000 mg | ORAL_TABLET | Freq: Four times a day (QID) | ORAL | 0 refills | Status: AC | PRN

## 2017-09-23 MED ORDER — ENOXAPARIN SODIUM 40 MG/0.4ML ~~LOC~~ SOLN: 40.0000 mg | SUBCUTANEOUS | 0 refills | Status: DC

## 2017-09-23 MED ORDER — LISINOPRIL 10 MG PO TABS
10.0000 mg | ORAL_TABLET | Freq: Every day | ORAL | Status: DC
  Administered 2017-09-23 – 2017-09-25 (×3): 10 mg via ORAL
  Filled 2017-09-23 (×3): qty 1

## 2017-09-23 MED ORDER — TRAMADOL HCL 50 MG PO TABS: 50.0000 mg | ORAL_TABLET | Freq: Four times a day (QID) | ORAL | 0 refills | Status: DC | PRN

## 2017-09-23 NOTE — Progress Notes (Signed)
Progress Note  Patient Name: Robert Fitzgerald Date of Encounter: 09/23/2017  Primary Cardiologist: Dr. Antoine Poche  Subjective   Patient sitting in chair, no restraints. Speech is more clear today, he denies pain  Inpatient Medications    Scheduled Meds: . enoxaparin (LOVENOX) injection  40 mg Subcutaneous Q24H  . feeding supplement (ENSURE ENLIVE)  237 mL Oral BID BM  . metoprolol tartrate  25 mg Oral QID  . QUEtiapine  25 mg Oral BID  . senna-docusate  1 tablet Oral BID   Continuous Infusions: . methocarbamol (ROBAXIN)  IV Stopped (09/19/17 0748)   PRN Meds: acetaminophen **OR** acetaminophen, bisacodyl, diphenhydrAMINE, haloperidol lactate, HYDROcodone-acetaminophen, menthol-cetylpyridinium **OR** phenol, methocarbamol **OR** methocarbamol (ROBAXIN)  IV, metoCLOPramide **OR** metoCLOPramide (REGLAN) injection, metoprolol tartrate, ondansetron (ZOFRAN) IV, polyethylene glycol, traMADol   Vital Signs    Vitals:   09/22/17 0600 09/22/17 1422 09/22/17 2050 09/23/17 0551  BP: (!) 164/82 (!) 146/89 (!) 170/85 (!) 171/76  Pulse: 76 80 85 73  Resp: Temp: 97.8 F (36.6 C) 98.3 F (36.8 C) 98.4 F (36.9 C) 97.9 F (36.6 C)  TempSrc: Oral Oral Oral Oral  SpO2: 99% 100%    Weight:    164 lb 7.4 oz (74.6 kg)  Height:        Intake/Output Summary (Last 24 hours) at 09/23/17 1028 Last data filed at 09/23/17 0900  Gross per 24 hour  Intake               60 ml  Output              400 ml  Net             -340 ml   Filed Weights   09/16/17 1438 09/21/17 2158 09/23/17 0551  Weight: 220 lb (99.8 kg) 174 lb 2.6 oz (79 kg) 164 lb 7.4 oz (74.6 kg)     Physical Exam   General: Well developed, well nourished, male appearing in no acute distress. Head: Normocephalic, atraumatic.  Neck: Supple without bruits, no JVD Lungs:  Resp regular and unlabored, CTA. Heart: RRR no murmur; no rub. Abdomen: Soft, non-tender, non-distended with normoactive bowel sounds. No  hepatomegaly. No rebound/guarding. No obvious abdominal masses. Extremities: No clubbing, cyanosis, trace edema. Distal pedal pulses are 1+ bilaterally. Neuro:  Moves all extremities spontaneously. Psych: Normal affect.  Labs    Chemistry Recent Labs Lab 09/20/17 [phone] 09/21/17 0458 09/22/17 0708 09/23/17 0550  NA 139 139 139 141  K 3.8 3.6 3.2* 3.7  CL 103 104 100* 103  CO2 GLUCOSE 142* 133* 134* 126*  BUN 23*  CREATININE 0.93 0.86 0.88 0.96  CALCIUM 8.5* 8.5* 8.9 9.3  PROT 6.8 6.4*  --   --   ALBUMIN 3.3* 3.1*  --   --   AST 52* 68*  --   --   ALT 23 37  --   --   ALKPHOS 62 59  --   --   BILITOT 1.2 1.5*  --   --   GFRNONAA >60 >60 >60 >60  GFRAA >60 >60 >60 >60  ANIONGAP Hematology Recent Labs Lab 09/21/17 0458 09/22/17 0708 09/23/17 0550  WBC 8.9 10.3 9.4  RBC 2.85* 3.13* 3.26*  HGB 9.0* 9.7* 10.0*  HCT 26.0* 28.5* 29.8*  MCV 91.2 91.1 91.4  MCH 31.6 31.0 30.7  MCHC 34.6 34.0 33.6  RDW  13.3 13.4 13.4  PLT 306 416* 475*    Cardiac Enzymes Recent Labs Lab 09/19/17 1424 09/19/17 1617 09/19/17 2319  TROPONINI 0.18* 0.19* 0.19*   No results for input(s): TROPIPOC in the last 168 hours.   BNPNo results for input(s): BNP, PROBNP in the last 168 hours.   DDimer No results for input(s): DDIMER in the last 168 hours.   Radiology    No results found.   Telemetry    Sinus, frequent PVCs - Personally Reviewed  ECG    No new tracings - Personally Reviewed   Cardiac Studies   Echo 09/19/17 Study Conclusions - Left ventricle: The cavity size was normal. Wall thickness was normal. Systolic function was moderately reduced. The estimated ejection fraction was in the range of 35% to 40%. Diffuse hypokinesis. Doppler parameters are consistent with abnormal left ventricular relaxation (grade 1 diastolic dysfunction). - Aortic valve: Trileaflet; moderately calcified leaflets. There was mild stenosis.  Mean gradient (S): 13 mm Hg. - Mitral valve: Mildly calcified annulus. There was no significant regurgitation. - Left atrium: The atrium was mildly dilated. - Right ventricle: The cavity size was normal. Systolic function was normal. - Pulmonary arteries: No complete TR doppler jet so unable to estimate PA systolic pressure. - Inferior vena cava: The vessel was normal in size. The respirophasic diameter changes were in the normal range (>= 50%), consistent with normal central venous pressure.  Impressions: - Technically difficult study with poor acoustic windows. Normal LV size with EF 35-40%. Diffuse hypokinesis. Normal RV size and systolic function. Mild aortic stenosis.  Patient Profile     76 y.o. male with a hx of SVT at 89 in 2014who was admitted for fx hip and rec'd Rt hip IM nailing and has since developed episodic SVT.  Assessment & Plan    1. SVT - started PO lopressor 25 mg q6hr yesterday with IV PRN lopressor - Per telemetry no further bouts of SVT but frequent PVCs - check Mg today - keep near 2.0 - Other electrolytes within normal limits -Patient remains asymptomatic   2. Chronic combined systolic and diastolic heart failure - Patient appears euvolemic on exam - Weight is now 164 lbs from 220 lbs   3. Confusion - History of closed head injury - Not currently in restraints    4. Hypertension  - Systolic blood pressure in the 170s this morning  - Restart home ACEI at a higher dose - 10 mg lisinopril daily  - serum creatinine within normal limits    Signed, Marcelino Duster , PA-C 10:28 AM 09/23/2017 Pager: [phone]  History and all data above reviewed.  Patient examined.  I agree with the findings as above.  Patient very awake today.  He is very confused and trying to get out of the chair.  It does look like he took his PO beta blocker yesterday.  The patient exam reveals COR:RRR  ,  Lungs: Clear  ,  Abd: Positive bowel sounds, no  rebound no guarding, Ext No edema  .  All available labs, radiology testing, previous records reviewed. Agree with documented assessment and plan. SVT seems to be controlled.  He has had PVCs.  It does not look like he required any PM doses of IV lopressor after the PO was started.  Can consolidate to 100 mg PO daily succinate at discharge. HTN:  Restarted lisinopril.   CM:  Titrate meds as tolerated.      Fayrene Fearing Tatanisha Cuthbert  11:34 AM  09/23/2017

## 2017-09-23 NOTE — Progress Notes (Signed)
Physical Therapy Treatment Patient Details Name: Robert Fitzgerald MRN: [id number] DOB: Oct 30, 1941 Today's Date: 09/23/2017    History of Present Illness 76 year old male with a history of closed head trauma, gastroesophageal reflux disease, hyperglycemia, dyslipidemia, hypertension who presents to the ED after a fall . Patient went down to the basement of his home to get his lawnmower, when he slipped and fell in the standing water in his basement. Patient was brought in by his wife who called EMS.  Patient denies hitting his head or loss of consciousness; pt has  R femur fx, s/p IM nail per Dr. Lequita Halt on 09/16/17    PT Comments    On arrival pt attempted to crawl OOB.  Offered BSC as pt stated he needed to go.  Pt required + 2 assist but was able to progress gait with walker.  Cognition improved but not yet at baseline.    Follow Up Recommendations  SNF     Equipment Recommendations  None recommended by PT    Recommendations for Other Services       Precautions / Restrictions Precautions Precautions: None Restrictions Weight Bearing Restrictions: No Other Position/Activity Restrictions: WBAT    Mobility  Bed Mobility Overal bed mobility: Needs Assistance Bed Mobility: Supine to Sit           General bed mobility comments: pt attempting to climb OOB on arrival.  B mittens and bed alarm on.    Transfers Overall transfer level: Needs assistance Equipment used: None Transfers: Sit to/from Stand Sit to Stand: Mod assist Stand pivot transfers: Mod assist       General transfer comment: + 2 side by assist to rise with hand over hand direction    assisted from elevated bed to [facility].  Ambulation/Gait Ambulation/Gait assistance: Mod assist;Max assist Ambulation Distance (Feet): 15 Feet Assistive device: Rolling walker (2 wheeled) Gait Pattern/deviations: Step-to pattern;Step-through pattern;Decreased stance time - right;Trunk flexed;Narrow base of support Gait velocity:  decreased   General Gait Details: + 2 side by side assist pt was able to increase amb distance and use walker.  Short, shuffled steps.  Unsteady.  Avg HR 103   Stairs            Wheelchair Mobility    Modified Rankin (Stroke Patients Only)       Balance                                            Cognition Arousal/Alertness: Awake/alert   Overall Cognitive Status: Impaired/Different from baseline Area of Impairment: Orientation;Attention;Memory;Following commands;Safety/judgement;Awareness;Problem solving                 Orientation Level: Disoriented to;Place;Time;Situation     Following Commands: Follows one step commands consistently Safety/Judgement: Decreased awareness of safety;Decreased awareness of deficits   Problem Solving: Slow processing;Decreased initiation;Difficulty sequencing;Requires verbal cues;Requires tactile cues General Comments: more alert but still confused and not at baseline      Exercises      General Comments        Pertinent Vitals/Pain Pain Assessment: Faces Faces Pain Scale: Hurts little more Pain Location: R hip with activity Pain Descriptors / Indicators: Sore;Grimacing;Operative site guarding Pain Intervention(s): Monitored during session;Repositioned    Home Living                      Prior Function  PT Goals (current goals can now be found in the care plan section) Progress towards PT goals: Progressing toward goals    Frequency    Min 3X/week      PT Plan Current plan remains appropriate    Co-evaluation              AM-PAC PT "6 Clicks" Daily Activity  Outcome Measure  Difficulty turning over in bed (including adjusting bedclothes, sheets and blankets)?: A Lot Difficulty moving from lying on back to sitting on the side of the bed? : A Lot Difficulty sitting down on and standing up from a chair with arms (e.g., wheelchair, bedside commode, etc,.)?: A  Lot Help needed moving to and from a bed to chair (including a wheelchair)?: A Lot Help needed walking in hospital room?: A Lot Help needed climbing 3-5 steps with a railing? : A Lot 6 Click Score: 12    End of Session Equipment Utilized During Treatment: Gait belt Activity Tolerance: Patient tolerated treatment well Patient left: in chair;with call bell/phone within reach;with chair alarm set Nurse Communication: Mobility status PT Visit Diagnosis: Difficulty in walking, not elsewhere classified (R26.2)     Time: 0940-1010 PT Time Calculation (min) (ACUTE ONLY): 30 min  Charges:  $Gait Training: 8-22 mins $Therapeutic Activity: 8-22 mins                    G Codes:       Felecia Shelling  PTA  [facility]  Acute  Rehab Pager      [phone]

## 2017-09-23 NOTE — Discharge Instructions (Signed)
Open Reduction and Internal Fixation for Hip Fracture Open reduction and internal fixation (ORIF) is a common surgery used to fix a hip fracture or a broken hip. A surgical cut (incision) in the skin will be made to open the fracture area. This lets the surgeon see the broken bone. The bone pieces will then be put back together. Hardware, such as screws, pins, rods, or a metal plate, will be used to hold the bones in place. Tell a health care provider about:  Any allergies you have.  All medicines you are taking, including vitamins, herbs, eye drops, creams, and over-the-counter medicines.  Any problems you or family members have had with anesthetic medicines.  Any blood disorders you have.  Any surgeries you have had.  Any medical conditions you have.  Your tobacco use. What are the risks? Generally, this is a safe procedure. However, problems can occur and include:  Blood clots in the legs or lungs.  Bleeding.  Infection.  Lung infection (pneumonia).  Continuing pain.  Difficulty walking.  What happens before the procedure?  A medical evaluation will be done. This examination will include checking your heart and lungs.  You may have imaging tests, including: ? X-rays to find exactly where the break is. ? CT scan to get a better view of the broken hip. ? MRI to check for any hidden fracture that cannot be seen on X-ray or CT scan.  You may also have blood and urine tests.  Ask your health care provider about changing or stopping your regular medicines. This is especially important if you are taking diabetes medicines or blood thinners, such as aspirin and ibuprofen. Do not take these medicines before your procedure if your health care provider asks you not to.  Do not eat or drink anything after midnight on the night before the procedure or as directed by your health care provider.  Plan to have someone take you home after you are released from the hospital. What  happens during the procedure?  You will be given one of the following: ? A medicine that numbs the area (local anesthetic). ? A medicine that makes you fall asleep (general anesthetic).  Small monitors will be put on your body. They will be used to check your heart, blood pressure, and oxygen level.  An IV tube will be inserted in your arm. Medicine will flow directly into your body through the IV tube.  A catheter may be inserted into your bladder to collect urine while you are asleep.  Your hip area will be scrubbed with a germ-killing solution (antiseptic).  When you are asleep or numb, the surgeon will move your bones back into normal alignment and position.  X-rays may be taken to check the position of the bones.  A skin incision will be made over the hip. It will go through your muscles to the broken bone.  The bones will be secured. Hardware will be used to hold the bone together.  The incision will be closed with small stitches (sutures) or staples.  A bandage (dressing) or some other kind of wound cover will be placed over the incision. What happens after the procedure?  Your blood pressure, heart rate, breathing rate, and blood oxygen level will be monitored often until the medicines you were given have worn off.  You may continue to receive fluids or pain medicines through the IV tube.  It is important for you to be up and moving as soon as possible after this  procedure.  Physical therapists will help you to start walking.  You may need to use a walker or crutches after the procedure.  Follow your health care provider's instructions about bearing weight on your injured leg.  You may have to wear compression stockings. These stockings help to prevent blood clots and reduce swelling in your legs. This information is not intended to replace advice given to you by your health care provider. Make sure you discuss any questions you have with your health care  provider. Document Released: 12/01/2009 Document Revised: 05/20/2016 Document Reviewed: 05/16/2014 Elsevier Interactive Patient Education  2017 [employer].

## 2017-09-23 NOTE — Evaluation (Signed)
Clinical/Bedside Swallow Evaluation Patient Details  Name: Robert Fitzgerald MRN: [id number] Date of Birth: [birth year]  Today's Date: 09/23/2017 Time: SLP Start Time (ACUTE ONLY): 1414 SLP Stop Time (ACUTE ONLY): 1507 SLP Time Calculation (min) (ACUTE ONLY): 53 min  Past Medical History:  Past Medical History:  Diagnosis Date  . Chronic back pain   . 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 Diverticulitis 1992  . ED (erectile dysfunction)   . GERD (gastroesophageal reflux disease)   . Headache, post-traumatic, chronic   . Hyperglycemia    Type 2, mild  . Hyperlipidemia    Diet controlled  . Hypertension   . Tinnitus    chronic   Past Surgical History:  Past Surgical History:  Procedure Laterality Date  . BACK SURGERY     x 6- including fusion  . CIRCUMCISION    . FEMUR IM NAIL Right 09/16/2017   Procedure: INTRAMEDULLARY (IM) NAIL FEMORAL;  Surgeon: Ollen Gross, MD;  Location: [facility];  Service: Orthopedics;  Laterality: Right;  . NASAL SEPTUM SURGERY  1999  . SHOULDER SURGERY  1999   2011 for bone spur   HPI:  76 yo male adm to [facility] with fall, found to have hip fx, required surgery- 9/21 s/p right femur repair.  PMH + for TBI, GERD, HTN.  Swallow evaluation ordered due to pt orally holding po intake.  Daughter present during evaluation.     Assessment / Plan / Recommendation Clinical Impression  Pt with symptoms of mild oral dysphagia and possible mild pharyngeal component.  First he had ill fitting dentures and SLP placed adhesive on them with daughter present to allow improved mastication skills. Suspect cognitive based dysphagia with decreased awareness due to pt's current illness.  Multiple audible swallows with pt requiring excessive time to masticate foods - he does benefit from cues to take small boluses.  Educated daughter and pt fully to dementia and dysphagia, modifications, gustatory changes, precautions to mitigate aspiration.  Advised daughter  it pt has recurrent pulmonary infections, unintentional weight loss or dysphagia symptoms that do not improve - MBS may be indicated.  Recommend follow up with SLP at next venue of care as pt for rehab.   SLP Visit Diagnosis: Dysphagia, oral phase (R13.11)    Aspiration Risk  Mild aspiration risk    Diet Recommendation Regular;Thin liquid (soft foods)   Liquid Administration via: Cup;Straw Medication Administration: Crushed with puree Supervision: Staff to assist with self feeding;Patient able to self feed Compensations: Slow rate;Small sips/bites Postural Changes: Seated upright at 90 degrees;Remain upright for at least 30 minutes after po intake    Other  Recommendations Oral Care Recommendations: Oral care BID   Follow up Recommendations None      Frequency and Duration   n/a         Prognosis   n/a     Swallow Study   General Date of Onset: 09/23/17 HPI: 76 yo male adm to [facility] with fall, found to have hip fx, required surgery- 9/21 s/p right femur repair.  PMH + for TBI, GERD, HTN.  Swallow evaluation ordered due to pt orally holding po intake.  Daughter present during evaluation.   Type of Study: Bedside Swallow Evaluation Diet Prior to this Study: Regular;Thin liquids Temperature Spikes Noted: No Respiratory Status: Room air History of Recent Intubation: No Behavior/Cognition: Alert;Other (Comment);Distractible;Requires cueing (confused) Oral Care Completed by SLP: No Oral Cavity - Dentition: Dentures, top;Dentures, bottom Vision: Functional for self-feeding  Self-Feeding Abilities: Needs assist Patient Positioning: Upright in bed Baseline Vocal Quality: Normal Volitional Cough: Weak Volitional Swallow: Unable to elicit    Oral/Motor/Sensory Function Overall Oral Motor/Sensory Function: Generalized oral weakness   Ice Chips Ice chips: Impaired Presentation: Spoon Oral Phase Impairments: Reduced lingual movement/coordination;Impaired mastication Oral Phase  Functional Implications: Prolonged oral transit Pharyngeal Phase Impairments: Suspected delayed Swallow   Thin Liquid Thin Liquid: Within functional limits Presentation: Cup;Straw Other Comments: audible swallow, multiple swallows but no overt indication of aspiration    Nectar Thick Nectar Thick Liquid: Not tested   Honey Thick Honey Thick Liquid: Not tested   Puree Puree: Not tested   Solid   GO   Solid: Impaired Oral Phase Impairments: Reduced lingual movement/coordination;Impaired mastication Oral Phase Functional Implications: Prolonged oral transit;Impaired mastication        Chales Abrahams 09/23/2017,4:21 PM  Donavan Burnet, MS [facility] SLP [phone]

## 2017-09-23 NOTE — Progress Notes (Signed)
1304 Patient had 3 beats of multifocal PVCs. Patient in no acute distress. MD notified. No new orders received.   1335 Patient had 7 beat run of PVCs. Patient in no acute distress. MD notified. No new orders received.

## 2017-09-23 NOTE — Progress Notes (Signed)
PROGRESS NOTE  Robert Fitzgerald ZOX:[id number] DOB: 12/11/1941 DOA: 09/16/2017   PCP: [name], MD  Subjective: Patient is still confused, but pleasant, no complaints  Assessment/Plan: SVT - in NSR this AM, keep on lopressor per cardiology  - ECHO with EF 35-40% - cardiology following, appreciate assistance   Post op delirium, vascular dementia  - Recent MRI brain done on 8/28 ordered by neurology showed multiple lacunar infarct.  - still with mittens but appears calm this AM - minimize distractions   Chronic combined systolic and diastolic heart failure - no signs of volume overload on exam this AM - can resume ACEI on discharge  - weight trend not monitored, will ask RN to check weight this AM Filed Weights   09/16/17 1438 09/21/17 2158 09/23/17 0551  Weight: 99.8 kg (220 lb) 79 kg (174 lb 2.6 oz) 74.6 kg (164 lb 7.4 oz)  - weight in OP clinic on 8/14 was 172 lbs but not clear if true baseline weight   Leukocytosis - suspected to be reactive - resolved   Hypokalemia - improved  Post op anemia - no evidence of active bleeding - CBC in AM  Supracondylar fracture of left humerus without intercondylar fracture - report Mechanical fall at home - S/p Intramedullary nailing, Right intertrochanteric femur on 9/21 - DVT prophylaxis per ortho, on Lovenox   HTN - resume ACEI on discharge   Severe malnutrition in context of acute illness/injury - appreciate nutritionist following   Dysphagia -staff has noted that patient is "pocketing" food in cheek and not swallowing appropriately. Speech therapy evaluation requested  Code Status: full Family Communication: patient  Disposition Plan: SNF in AM  Consultants:  Ortho  cardiology  Procedures:  Intramedullary nailing, Right intertrochanteric femur on 9/21  Antibiotics:  Perioperative ancef  Objective: BP 117/78 (BP Location: Left Arm)   Pulse 63   Temp 98.2 F (36.8 C) (Oral)   Resp 20   Ht 6'  2" (1.88 m)   Wt 74.6 kg (164 lb 7.4 oz)   SpO2 99%   BMI 21.12 kg/m   Intake/Output Summary (Last 24 hours) at 09/23/17 1556 Last data filed at 09/23/17 0900  Gross per 24 hour  Intake               60 ml  Output              400 ml  Net             -340 ml   Filed Weights   09/16/17 1438 09/21/17 2158 09/23/17 0551  Weight: 99.8 kg (220 lb) 79 kg (174 lb 2.6 oz) 74.6 kg (164 lb 7.4 oz)   Physical Exam  Constitutional: Appears calm, NAD CVS: RRR, S1/S2 +, no gallops, no carotid bruit.  Pulmonary: Effort and breath sounds normal, no stridor, rhonchi, wheezes, rales.  Abdominal: Soft. BS +,  no distension, tenderness, rebound or guarding.  Neuro: Alert. Normal reflexes, muscle tone coordination.  Data Reviewed: Basic Metabolic Panel:  Recent Labs Lab 09/18/17 0614 09/19/17 0726 09/20/17 0628 09/21/17 0458 09/22/17 0708 09/23/17 0550  NA 138 139 139 139 139 141  K 3.8 3.9 3.8 3.6 3.2* 3.7  CL 105 109 103 104 100* 103  CO2 GLUCOSE 164* 164* 142* 133* 134* 126*  BUN 23*  CREATININE 1.12 1.03 0.93 0.86 0.88 0.96  CALCIUM 8.6* 8.1* 8.5* 8.5* 8.9 9.3  MG 1.8  1.9 2.1  --   --  2.1   Liver Function Tests:  Recent Labs Lab 09/20/17 0628 09/21/17 0458  AST 52* 68*  ALT 23 37  ALKPHOS 62 59  BILITOT 1.2 1.5*  PROT 6.8 6.4*  ALBUMIN 3.3* 3.1*    Recent Labs Lab 09/19/17 0726  AMMONIA 27   CBC:  Recent Labs Lab 09/19/17 0726 09/20/17 0628 09/21/17 0458 09/22/17 0708 09/23/17 0550  WBC 12.0* 11.3* 8.9 10.3 9.4  NEUTROABS  --  7.8*  --   --   --   HGB 8.3* 9.1* 9.0* 9.7* 10.0*  HCT 24.5* 26.3* 26.0* 28.5* 29.8*  MCV 94.6 92.9 91.2 91.1 91.4  PLT 187 259 306 416* 475*   Cardiac Enzymes:    Recent Labs Lab 09/19/17 1424 09/19/17 1617 09/19/17 2319  TROPONINI 0.18* 0.19* 0.19*   CBG:  Recent Labs Lab 09/16/17 2044  GLUCAP 182*   Studies: No results found.  Scheduled Meds: . enoxaparin (LOVENOX)  injection  40 mg Subcutaneous Q24H  . feeding supplement (ENSURE ENLIVE)  237 mL Oral BID BM  . lisinopril  10 mg Oral Daily  . metoprolol tartrate  25 mg Oral QID  . QUEtiapine  25 mg Oral BID  . senna-docusate  1 tablet Oral BID   Continuous Infusions: . methocarbamol (ROBAXIN)  IV Stopped (09/19/17 0748)   Time spent: 25 minutes  I have personally reviewed and interpreted on  09/23/2017 daily labs, tele strips, imagings as discussed above under date review session and assessment and plans.  I have discussed plan of care as described above with RN , patient  on 09/23/2017  [facility] MD [facility] Pager [phone]. If 7PM-7AM, please contact night-coverage at [email or web address], password [facility] 09/23/2017, 3:56 PM  LOS: 7 days

## 2017-09-23 NOTE — Progress Notes (Signed)
3 run beat v tach at 1904/5 run beat at 0418  Asymptomatic current rhythm unifocal pvcs On call notified no respone noted

## 2017-09-23 NOTE — Progress Notes (Addendum)
   Subjective: 7 Days Post-Op Procedure(s) (LRB): INTRAMEDULLARY (IM) NAIL FEMORAL (Right) Patient still confused and disoriented at this time. Patient seen in rounds with Dr. Lequita Halt. Patient is not able to communicate appropriately on morning rounds although he does appear more alert today as compared toprevious days. Plan is for SNF once medically stable.  Objective: Vital signs in last 24 hours: Temp:  [97.9 F (36.6 C)-98.4 F (36.9 C)] 97.9 F (36.6 C) (09/28 0551) Pulse Rate:  [73-85] 73 (09/28 0551) Resp:  [16-20] 20 (09/28 0551) BP: (146-171)/(76-89) 171/76 (09/28 0551) SpO2:  [100 %] 100 % (09/27 1422) Weight:  [74.6 kg (164 lb 7.4 oz)] 74.6 kg (164 lb 7.4 oz) (09/28 0551)  Intake/Output from previous day:  Intake/Output Summary (Last 24 hours) at 09/23/17 0745 Last data filed at 09/23/17 0551  Gross per 24 hour  Intake                0 ml  Output              400 ml  Net             -400 ml    Intake/Output this shift: No intake/output data recorded.  Labs:  Recent Labs  09/21/17 0458 09/22/17 0708 09/23/17 0550  HGB 9.0* 9.7* 10.0*    Recent Labs  09/22/17 0708 09/23/17 0550  WBC 10.3 9.4  RBC 3.13* 3.26*  HCT 28.5* 29.8*  PLT 416* 475*    Recent Labs  09/22/17 0708 09/23/17 0550  NA 139 141  K 3.2* 3.7  CL 100* 103  CO2 27 27  BUN 16 23*  CREATININE 0.88 0.96  GLUCOSE 134* 126*  CALCIUM 8.9 9.3   No results for input(s): LABPT, INR in the last 72 hours.  EXAM: General - Patient is Alert and Confused Extremity - Patient mving foot and toes on exam. Responds to stimuli. Incisions - clean, dry, no drainage, moderate ecchymosis noted posteriorly - to be expected from type of fracture and blood thinner and bedrest. Motor Function - intact, moving foot and toes on exam.   Assessment/Plan: 7 Days Post-Op Procedure(s) (LRB): INTRAMEDULLARY (IM) NAIL FEMORAL (Right) Procedure(s) (LRB): INTRAMEDULLARY (IM) NAIL FEMORAL (Right) Past  Medical History:  Diagnosis Date  . Chronic back pain   . 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 Diverticulitis 1992  . ED (erectile dysfunction)   . GERD (gastroesophageal reflux disease)   . Headache, post-traumatic, chronic   . Hyperglycemia    Type 2, mild  . Hyperlipidemia    Diet controlled  . Hypertension   . Tinnitus    chronic   Principal Problem:   Supracondylar fracture of left humerus without intercondylar fracture Active Problems:   Prostate cancer screening   Hip fracture (HCC)  Estimated body mass index is 21.12 kg/m as calculated from the following:   Height as of this encounter:  (1.88 m).   Weight as of this encounter: 74.6 kg (164 lb 7.4 oz).   Follow up - in 2-3 weeks following surgery Activity - WBAT to the right leg Disposition - Skilled nursing facility D/C Meds - Tramadol   DVT Prophylaxis - Lovenox   Ortho service will sign off at this time.  Please call the office at [phone] for any questions.  Plan for SNF of choice once medically stable.  Avel Peace, PA-C Orthopaedic Surgery 09/23/2017, 7:45 AM

## 2017-09-24 DIAGNOSIS — S42415A Nondisplaced simple supracondylar fracture without intercondylar fracture of left humerus, initial encounter for closed fracture: Secondary | ICD-10-CM

## 2017-09-24 MED ORDER — POLYETHYLENE GLYCOL 3350 17 G PO PACK: 17.0000 g | PACK | Freq: Every day | ORAL | 0 refills | Status: DC | PRN

## 2017-09-24 MED ORDER — QUETIAPINE FUMARATE 25 MG PO TABS: 25.0000 mg | ORAL_TABLET | Freq: Two times a day (BID) | ORAL | 0 refills | Status: DC

## 2017-09-24 MED ORDER — METOPROLOL SUCCINATE ER 50 MG PO TB24
100.0000 mg | ORAL_TABLET | Freq: Every day | ORAL | Status: DC
  Administered 2017-09-24 – 2017-09-25 (×2): 100 mg via ORAL
  Filled 2017-09-24 (×2): qty 2

## 2017-09-24 MED ORDER — SENNOSIDES-DOCUSATE SODIUM 8.6-50 MG PO TABS: 1.0000 | ORAL_TABLET | Freq: Two times a day (BID) | ORAL | Status: DC

## 2017-09-24 MED ORDER — METOPROLOL SUCCINATE ER 100 MG PO TB24: 100.0000 mg | ORAL_TABLET | Freq: Every day | ORAL | 0 refills | Status: DC

## 2017-09-24 MED ORDER — METHOCARBAMOL 500 MG PO TABS: 500.0000 mg | ORAL_TABLET | Freq: Four times a day (QID) | ORAL | 0 refills | Status: DC | PRN

## 2017-09-24 MED ORDER — LISINOPRIL 10 MG PO TABS: 10.0000 mg | ORAL_TABLET | Freq: Every day | ORAL | 0 refills | Status: DC

## 2017-09-24 NOTE — Discharge Summary (Addendum)
Physician Discharge Summary  Robert Fitzgerald WGN:[id number] DOB: 04-May-1941 DOA: 09/16/2017  PCP: [name], MD  Admit date: 09/16/2017 Discharge date: 09/25/2017  Recommendations for Outpatient Follow-up:  1. Pt will need to follow up with PCP in 1 week post discharge 2. Please obtain BMP to evaluate electrolytes and kidney function 3. Please also check CBC to evaluate Hg and Hct levels  Discharge Diagnoses:  Active Problems:   Essential hypertension   SVT (supraventricular tachycardia) (HCC)   Prostate cancer screening   Hip fracture (HCC)  Discharge Condition: Stable  Diet recommendation: Heart healthy diet discussed in details   History of present illness:  SVT - in NSR this AM, keep on lopressor per cardiology  - ECHO with EF 35-40% - K and Mg are WNL this AM, will repeat BMP and Mg in AM   Post op delirium, vascular dementia  - Recent MRI brain done on 8/28 ordered by neurology showed multiple lacunar infarct.  - still intermittently confused but calm and pleasant   Chronic combined systolic and diastolic heart failure - no signs of volume overload on exam this AM - can resume ACEI on discharge  - weight trend in the past 72 hours  Filed Weights   09/21/17 2158 09/23/17 0551 09/24/17 0855  Weight: 79 kg (174 lb 2.6 oz) 74.6 kg (164 lb 7.4 oz) 76.6 kg (168 lb 14 oz)   Leukocytosis - suspected to be reactive - resolved   Hypokalemia - improved  Post op anemia - no evidence of active bleeding - CBC in AM   HTN - resume ACEI on discharge   Severe malnutrition in context of acute illness/injury - appreciate nutritionist following   Dysphagia - regular diet   Code Status: full Family Communication: patient  Disposition Plan: SNF in AM  Consultants:  Ortho  cardiology  Procedures:  Intramedullary nailing,Rightintertrochanteric femur on 9/21  Antibiotics:  Perioperative ancef  Procedures/Studies: Ct Hip Right Wo  Contrast  Result Date: 09/16/2017 CLINICAL DATA:  Right hip pain after fall. EXAM: CT OF THE RIGHT HIP WITHOUT CONTRAST TECHNIQUE: Multidetector CT imaging of the right hip was performed according to the standard protocol. Multiplanar CT image reconstructions were also generated. COMPARISON:  Right hip x-rays from same date. FINDINGS: Bones/Joint/Cartilage There is a comminuted intertrochanteric fracture, with medial and superior displacement of the lesser trochanter. No other fractures identified. No dislocation. Moderate degenerative changes of the right hip and sacroiliac joints. Osteopenia. Ligaments Suboptimally assessed by CT. Muscles and Tendons No focal abnormality. Soft tissues Moderate amount of hematoma about the fracture site, extending into the adductor compartment. Edema/hematoma is also seen along the proximal superficial femoral vessels. IMPRESSION: Comminuted intertrochanteric fracture with displaced lesser trochanter. Moderate surrounding hematoma extending into the adductor compartment. Electronically Signed   By: Obie Dredge M.D.   On: 09/16/2017 11:34   Chest Portable 1 View  Result Date: 09/16/2017 CLINICAL DATA:  76 year old male status post fall this morning with right hip fracture. EXAM: PORTABLE CHEST 1 VIEW COMPARISON:  06/14/2017. FINDINGS: Portable AP supine view at 1144 hours. Stable lung volumes. Stable cardiac size and mediastinal contours. Visualized tracheal air column is within normal limits. Allowing for portable technique the lungs are clear. No pneumothorax or pleural effusion. IMPRESSION: No acute cardiopulmonary abnormality. Electronically Signed   By: Odessa Fleming M.D.   On: 09/16/2017 12:04   Dg C-arm 1-60 Min-no Report  Result Date: 09/16/2017 Fluoroscopy was utilized by the requesting physician.  No radiographic interpretation.  Dg Hip Port Unilat With Pelvis 1v Right  Result Date: 09/16/2017 CLINICAL DATA:  Right hip surgery EXAM: DG HIP (WITH OR WITHOUT  PELVIS) 1V PORT RIGHT COMPARISON:  09/16/2017 FINDINGS: Three low resolution intraoperative spot views of the right hip. The images demonstrate intramedullary rod and distal screw fixation of the proximal right femur for intertrochanteric fracture. Total fluoroscopy time was 31 seconds. IMPRESSION: Intraoperative fluoroscopic assistance provided during surgical fixation of right femoral fracture Electronically Signed   By: Jasmine Pang M.D.   On: 09/16/2017 21:20   Dg Hip Unilat With Pelvis 2-3 Views Right  Result Date: 09/16/2017 CLINICAL DATA:  Fall, right hip pain EXAM: DG HIP (WITH OR WITHOUT PELVIS) 2-3V RIGHT COMPARISON:  None. FINDINGS: Irregular linear lucency extending from the greater trochanter for towards a displaced lesser trochanter favors an intertrochanteric right hip fracture. Bilateral hip joint spaces are preserved. Visualized bony pelvis appears intact. Mild degenerative changes of the lower lumbar spine. IMPRESSION: Suspected intertrochanteric right hip fracture with displaced lesser trochanter. Electronically Signed   By: Charline Bills M.D.   On: 09/16/2017 09:50   Dg Femur 1v Right  Result Date: 09/16/2017 CLINICAL DATA:  Fall, right hip pain EXAM: RIGHT FEMUR 1 VIEW COMPARISON:  None. FINDINGS: Suspected nondisplaced intertrochanteric right hip fracture, poorly visualized. Displaced lesser trochanter. Distal femur appears intact. IMPRESSION: Suspected nondisplaced intertrochanteric right hip fracture, poorly visualized. Displaced lesser trochanter. Electronically Signed   By: Charline Bills M.D.   On: 09/16/2017 09:49     Discharge Exam: Vitals:   09/24/17 0431 09/24/17 1326  BP: (!) 151/75   Pulse: 67   Resp: 20 20  Temp: 98.8 F (37.1 C)   SpO2:     Vitals:   09/23/17 2134 09/24/17 0431 09/24/17 0855 09/24/17 1326  BP: (!) 141/70 (!) 151/75    Pulse: 73 67    Resp: Temp: 98 F (36.7 C) 98.8 F (37.1 C)    TempSrc: Oral Oral  Oral  SpO2: 100%      Weight:   76.6 kg (168 lb 14 oz)   Height:       General: Pt is alert but confused  Cardiovascular: Regular rate and rhythm,  no rubs, no gallops Respiratory: Clear to auscultation bilaterally, no wheezing, diminished breath sounds at bases  Abdominal: Soft, non tender, non distended, bowel sounds +, no guarding  Discharge Instructions  Discharge Instructions    Call MD / Call 911    Complete by:  As directed    If you experience chest pain or shortness of breath, CALL 911 and be transported to the hospital emergency room.  If you develope a fever above 101 F, pus (white drainage) or increased drainage or redness at the wound, or calf pain, call your surgeon's office.   Change dressing    Complete by:  As directed    You may change your dressing dressing daily with sterile 4 x 4 inch gauze dressing and paper tape.  Do not submerge the incision under water.   Discharge instructions    Complete by:  As directed    Lovenox injections for one more week, then switch over to a baby 81 mg Aspirin daily for four additional weeks.  Pick up stool softner and laxative for home use following surgery while on pain medications. Do not submerge incision under water. Please use good hand washing techniques while changing dressing each day. May shower starting three days after surgery. Please use  a clean towel to pat the incision dry following showers. Continue to use ice for pain and swelling after surgery. Do not use any lotions or creams on the incision until instructed by your surgeon.  Wear both TED hose on both legs during the day every day for three weeks, but may remove the TED hose at night at home.  Postoperative Constipation Protocol  Constipation - defined medically as fewer than three stools per week and severe constipation as less than one stool per week.  One of the most common issues patients have following surgery is constipation.  Even if you have a regular bowel pattern at  home, your normal regimen is likely to be disrupted due to multiple reasons following surgery.  Combination of anesthesia, postoperative narcotics, change in appetite and fluid intake all can affect your bowels.  In order to avoid complications following surgery, here are some recommendations in order to help you during your recovery period.  Colace (docusate) - Pick up an over-the-counter form of Colace or another stool softener and take twice a day as long as you are requiring postoperative pain medications.  Take with a full glass of water daily.  If you experience loose stools or diarrhea, hold the colace until you stool forms back up.  If your symptoms do not get better within 1 week or if they get worse, check with your doctor.  Dulcolax (bisacodyl) - Pick up over-the-counter and take as directed by the product packaging as needed to assist with the movement of your bowels.  Take with a full glass of water.  Use this product as needed if not relieved by Colace only.   MiraLax (polyethylene glycol) - Pick up over-the-counter to have on hand.  MiraLax is a solution that will increase the amount of water in your bowels to assist with bowel movements.  Take as directed and can mix with a glass of water, juice, soda, coffee, or tea.  Take if you go more than two days without a movement. Do not use MiraLax more than once per day. Call your doctor if you are still constipated or irregular after using this medication for 7 days in a row.  If you continue to have problems with postoperative constipation, please contact the office for further assistance and recommendations.  If you experience "the worst abdominal pain ever" or develop nausea or vomiting, please contact the office immediatly for further recommendations for treatment.   Do not sit on low chairs, stoools or toilet seats, as it may be difficult to get up from low surfaces    Complete by:  As directed    Driving restrictions    Complete by:  As  directed    No driving until released by the physician.   Increase activity slowly as tolerated    Complete by:  As directed    Lifting restrictions    Complete by:  As directed    No lifting until released by the physician.   Patient may shower    Complete by:  As directed    You may shower without a dressing once there is no drainage.  Do not wash over the wound.  If drainage remains, do not shower until drainage stops.   TED hose    Complete by:  As directed    Use stockings (TED hose) for 3 weeks on both leg(s).  You may remove them at night for sleeping.   Weight bearing as tolerated    Complete by:  As directed      Allergies as of 09/24/2017      Reactions   Aspirin    REACTION: GI upset   Codeine Sulfate    REACTION: nausea   Naproxen Sodium    REACTION: GI upset      Medication List    STOP taking these medications   sildenafil 100 MG tablet Commonly known as:  VIAGRA     TAKE these medications   acetaminophen 325 MG tablet Commonly known as:  TYLENOL Take 2 tablets (650 mg total) by mouth every 6 (six) hours as needed for mild pain (or Fever >/= 101).   enoxaparin 40 MG/0.4ML injection Commonly known as:  LOVENOX Inject 0.4 mLs (40 mg total) into the skin daily. Lovenox injections for one more week, then switch over to a baby 81 mg Aspirin daily for four additional weeks.   lisinopril 10 MG tablet Commonly known as:  PRINIVIL,ZESTRIL Take 1 tablet (10 mg total) by mouth daily. What changed:  medication strength  how much to take   methocarbamol 500 MG tablet Commonly known as:  ROBAXIN Take 1 tablet (500 mg total) by mouth every 6 (six) hours as needed for muscle spasms.   metoprolol succinate 100 MG 24 hr tablet Commonly known as:  TOPROL-XL Take 1 tablet (100 mg total) by mouth daily. Take with or immediately following a meal.   polyethylene glycol packet Commonly known as:  MIRALAX / GLYCOLAX Take 17 g by mouth daily as needed for mild  constipation.   QUEtiapine 25 MG tablet Commonly known as:  SEROQUEL Take 1 tablet (25 mg total) by mouth 2 (two) times daily.   senna-docusate 8.6-50 MG tablet Commonly known as:  Senokot-S Take 1 tablet by mouth 2 (two) times daily.   traMADol 50 MG tablet Commonly known as:  ULTRAM Take 1-2 tablets (50-100 mg total) by mouth every 6 (six) hours as needed for moderate pain.            Discharge Care Instructions        Start     Ordered   09/25/17 0000  lisinopril (PRINIVIL,ZESTRIL) 10 MG tablet  Daily     09/24/17 1422   09/25/17 0000  metoprolol succinate (TOPROL-XL) 100 MG 24 hr tablet  Daily     09/24/17 1422   09/24/17 0000  methocarbamol (ROBAXIN) 500 MG tablet  Every 6 hours PRN     09/24/17 1422   09/24/17 0000  polyethylene glycol (MIRALAX / GLYCOLAX) packet  Daily PRN     09/24/17 1422   09/24/17 0000  QUEtiapine (SEROQUEL) 25 MG tablet  2 times daily     09/24/17 1422   09/24/17 0000  senna-docusate (SENOKOT-S) 8.6-50 MG tablet  2 times daily     09/24/17 1422   09/23/17 0000  acetaminophen (TYLENOL) 325 MG tablet  Every 6 hours PRN    Question:  Supervising Provider  Answer:  Ollen Gross   09/23/17 0753   09/23/17 0000  enoxaparin (LOVENOX) 40 MG/0.4ML injection  Every 24 hours    Question:  Supervising Provider  Answer:  Ollen Gross   09/23/17 0753   09/23/17 0000  traMADol (ULTRAM) 50 MG tablet  Every 6 hours PRN    Question:  Supervising Provider  Answer:  Ollen Gross   09/23/17 0753   09/23/17 0000  Call MD / Call 911    Comments:  If you experience chest pain or shortness of breath, CALL 911 and be  transported to the hospital emergency room.  If you develope a fever above 101 F, pus (white drainage) or increased drainage or redness at the wound, or calf pain, call your surgeon's office.   09/23/17 0753   09/23/17 0000  Discharge instructions    Comments:  Lovenox injections for one more week, then switch over to a baby 81 mg Aspirin daily  for four additional weeks.  Pick up stool softner and laxative for home use following surgery while on pain medications. Do not submerge incision under water. Please use good hand washing techniques while changing dressing each day. May shower starting three days after surgery. Please use a clean towel to pat the incision dry following showers. Continue to use ice for pain and swelling after surgery. Do not use any lotions or creams on the incision until instructed by your surgeon.  Wear both TED hose on both legs during the day every day for three weeks, but may remove the TED hose at night at home.  Postoperative Constipation Protocol  Constipation - defined medically as fewer than three stools per week and severe constipation as less than one stool per week.  One of the most common issues patients have following surgery is constipation.  Even if you have a regular bowel pattern at home, your normal regimen is likely to be disrupted due to multiple reasons following surgery.  Combination of anesthesia, postoperative narcotics, change in appetite and fluid intake all can affect your bowels.  In order to avoid complications following surgery, here are some recommendations in order to help you during your recovery period.  Colace (docusate) - Pick up an over-the-counter form of Colace or another stool softener and take twice a day as long as you are requiring postoperative pain medications.  Take with a full glass of water daily.  If you experience loose stools or diarrhea, hold the colace until you stool forms back up.  If your symptoms do not get better within 1 week or if they get worse, check with your doctor.  Dulcolax (bisacodyl) - Pick up over-the-counter and take as directed by the product packaging as needed to assist with the movement of your bowels.  Take with a full glass of water.  Use this product as needed if not relieved by Colace only.   MiraLax (polyethylene glycol) - Pick up  over-the-counter to have on hand.  MiraLax is a solution that will increase the amount of water in your bowels to assist with bowel movements.  Take as directed and can mix with a glass of water, juice, soda, coffee, or tea.  Take if you go more than two days without a movement. Do not use MiraLax more than once per day. Call your doctor if you are still constipated or irregular after using this medication for 7 days in a row.  If you continue to have problems with postoperative constipation, please contact the office for further assistance and recommendations.  If you experience "the worst abdominal pain ever" or develop nausea or vomiting, please contact the office immediatly for further recommendations for treatment.   09/23/17 0753   09/23/17 0000  Increase activity slowly as tolerated     09/23/17 0753   09/23/17 0000  Patient may shower    Comments:  You may shower without a dressing once there is no drainage.  Do not wash over the wound.  If drainage remains, do not shower until drainage stops.   09/23/17 0753   09/23/17 0000  Weight bearing as tolerated     09/23/17 0753   09/23/17 0000  Driving restrictions    Comments:  No driving until released by the physician.   09/23/17 0753   09/23/17 0000  Lifting restrictions    Comments:  No lifting until released by the physician.   09/23/17 0753   09/23/17 0000  Change dressing    Comments:  You may change your dressing dressing daily with sterile 4 x 4 inch gauze dressing and paper tape.  Do not submerge the incision under water.   09/23/17 0753   09/23/17 0000  TED hose    Comments:  Use stockings (TED hose) for 3 weeks on both leg(s).  You may remove them at night for sleeping.   09/23/17 0753   09/23/17 0000  Do not sit on low chairs, stoools or toilet seats, as it may be difficult to get up from low surfaces     09/23/17 0753     Follow-up Information    Ollen Gross, MD. Schedule an appointment as soon as possible for a visit  on 10/04/2017.   Specialty:  Orthopedic Surgery Why:  Call office ASAP at [phone] to setup follow up appt for patient with Dr. Lequita Halt on Tuesday 10/04/2017. Contact information: [address] [place] [address] [phone]        [name], MD Follow up.   Specialties:  Family Medicine, Radiology Contact information: [address] [place] [address] [phone]            The results of significant diagnostics from this hospitalization (including imaging, microbiology, ancillary and laboratory) are listed below for reference.     Microbiology: No results found for this or any previous visit (from the past 240 hour(s)).   Labs: Basic Metabolic Panel:  Recent Labs Lab 09/18/17 0614 09/19/17 0726 09/20/17 0628 09/21/17 0458 09/22/17 0708 09/23/17 0550  NA 138 139 139 139 139 141  K 3.8 3.9 3.8 3.6 3.2* 3.7  CL 105 109 103 104 100* 103  CO2 24 22 25 23 27 27   GLUCOSE 164* 164* 142* 133* 134* 126*  BUN 14 18 14 16 16  23*  CREATININE 1.12 1.03 0.93 0.86 0.88 0.96  CALCIUM 8.6* 8.1* 8.5* 8.5* 8.9 9.3  MG 1.8 1.9 2.1  --   --  2.1   Liver Function Tests:  Recent Labs Lab 09/20/17 0628 09/21/17 0458  AST 52* 68*  ALT 23 37  ALKPHOS 62 59  BILITOT 1.2 1.5*  PROT 6.8 6.4*  ALBUMIN 3.3* 3.1*    Recent Labs Lab 09/19/17 0726  AMMONIA 27   CBC:  Recent Labs Lab 09/19/17 0726 09/20/17 0628 09/21/17 0458 09/22/17 0708 09/23/17 0550  WBC 12.0* 11.3* 8.9 10.3 9.4  NEUTROABS  --  7.8*  --   --   --   HGB 8.3* 9.1* 9.0* 9.7* 10.0*  HCT 24.5* 26.3* 26.0* 28.5* 29.8*  MCV 94.6 92.9 91.2 91.1 91.4  PLT 187 259 306 416* 475*   Cardiac Enzymes:  Recent Labs Lab 09/19/17 1424 09/19/17 1617 09/19/17 2319  TROPONINI 0.18* 0.19* 0.19*   SIGNED: Time coordinating discharge: 60 minutes  Debbora Presto, MD  [facility] 09/24/2017, 2:22 PM Pager [phone]  If 7PM-7AM, please contact  night-coverage [email or web address] Password [email or web address]

## 2017-09-24 NOTE — Progress Notes (Signed)
Dr Lindaann Slough rounding note reviewed. Tele reviewed, still with PVCs, very short run of SVT but nothing sustained. Agree with changing lopressor to Toprol XL  daily in setting of systolic dysfunction. Other medical therapy with lisiniopril  daily. He has been euvolemic. May consider further titration of Toprol pending vitals and PVC burden. Keep K at 4 and Mg at 2. No additional cardiology recs at this time, we will f/u telemetry tomorrow.   Dina Rich MD

## 2017-09-24 NOTE — Clinical Social Work Note (Signed)
Per MD pt not ready for discharge today and will discharge to [facility] on Sunday 9/30. Admission paperwork completed at SNF today. Toniann Fail confirmed pt can admit tomorrow in the AM. CSW continuing to follow for discharge needs.   Corlis Hove, LCSWA, LCASA Clinical Social Work Jamelle Rushing coverage) [phone]

## 2017-09-25 DIAGNOSIS — S72001A Fracture of unspecified part of neck of right femur, initial encounter for closed fracture: Secondary | ICD-10-CM | POA: Diagnosis not present

## 2017-09-25 DIAGNOSIS — I10 Essential (primary) hypertension: Secondary | ICD-10-CM | POA: Diagnosis not present

## 2017-09-25 DIAGNOSIS — E46 Unspecified protein-calorie malnutrition: Secondary | ICD-10-CM | POA: Diagnosis not present

## 2017-09-25 DIAGNOSIS — R4182 Altered mental status, unspecified: Secondary | ICD-10-CM | POA: Diagnosis not present

## 2017-09-25 DIAGNOSIS — R41841 Cognitive communication deficit: Secondary | ICD-10-CM | POA: Diagnosis not present

## 2017-09-25 DIAGNOSIS — R4702 Dysphasia: Secondary | ICD-10-CM | POA: Diagnosis not present

## 2017-09-25 DIAGNOSIS — Z7901 Long term (current) use of anticoagulants: Secondary | ICD-10-CM | POA: Diagnosis not present

## 2017-09-25 DIAGNOSIS — F0150 Vascular dementia without behavioral disturbance: Secondary | ICD-10-CM | POA: Diagnosis not present

## 2017-09-25 DIAGNOSIS — M25559 Pain in unspecified hip: Secondary | ICD-10-CM | POA: Diagnosis not present

## 2017-09-25 DIAGNOSIS — R278 Other lack of coordination: Secondary | ICD-10-CM | POA: Diagnosis not present

## 2017-09-25 DIAGNOSIS — E43 Unspecified severe protein-calorie malnutrition: Secondary | ICD-10-CM | POA: Diagnosis not present

## 2017-09-25 DIAGNOSIS — K219 Gastro-esophageal reflux disease without esophagitis: Secondary | ICD-10-CM | POA: Diagnosis not present

## 2017-09-25 DIAGNOSIS — S92153A Displaced avulsion fracture (chip fracture) of unspecified talus, initial encounter for closed fracture: Secondary | ICD-10-CM | POA: Diagnosis not present

## 2017-09-25 DIAGNOSIS — M6281 Muscle weakness (generalized): Secondary | ICD-10-CM | POA: Diagnosis not present

## 2017-09-25 DIAGNOSIS — I471 Supraventricular tachycardia: Secondary | ICD-10-CM | POA: Diagnosis not present

## 2017-09-25 DIAGNOSIS — F0151 Vascular dementia with behavioral disturbance: Secondary | ICD-10-CM | POA: Diagnosis not present

## 2017-09-25 DIAGNOSIS — D500 Iron deficiency anemia secondary to blood loss (chronic): Secondary | ICD-10-CM | POA: Diagnosis not present

## 2017-09-25 DIAGNOSIS — S72144D Nondisplaced intertrochanteric fracture of right femur, subsequent encounter for closed fracture with routine healing: Secondary | ICD-10-CM | POA: Diagnosis not present

## 2017-09-25 DIAGNOSIS — F0391 Unspecified dementia with behavioral disturbance: Secondary | ICD-10-CM | POA: Diagnosis not present

## 2017-09-25 DIAGNOSIS — Z4789 Encounter for other orthopedic aftercare: Secondary | ICD-10-CM | POA: Diagnosis not present

## 2017-09-25 DIAGNOSIS — R410 Disorientation, unspecified: Secondary | ICD-10-CM | POA: Diagnosis not present

## 2017-09-25 DIAGNOSIS — I5042 Chronic combined systolic (congestive) and diastolic (congestive) heart failure: Secondary | ICD-10-CM | POA: Diagnosis not present

## 2017-09-25 DIAGNOSIS — W19XXXD Unspecified fall, subsequent encounter: Secondary | ICD-10-CM | POA: Diagnosis not present

## 2017-09-25 DIAGNOSIS — R2689 Other abnormalities of gait and mobility: Secondary | ICD-10-CM | POA: Diagnosis not present

## 2017-09-25 DIAGNOSIS — R1312 Dysphagia, oropharyngeal phase: Secondary | ICD-10-CM | POA: Diagnosis not present

## 2017-09-25 LAB — BASIC METABOLIC PANEL
ANION GAP: 9 (ref 5–15)
BUN: 20 mg/dL (ref 6–20)
CALCIUM: 9 mg/dL (ref 8.9–10.3)
CO2: 28 mmol/L (ref 22–32)
CREATININE: 0.85 mg/dL (ref 0.61–1.24)
Chloride: 103 mmol/L (ref 101–111)
Glucose, Bld: 118 mg/dL — ABNORMAL HIGH (ref 65–99)
Potassium: 3.8 mmol/L (ref 3.5–5.1)
SODIUM: 140 mmol/L (ref 135–145)

## 2017-09-25 LAB — CBC
HEMATOCRIT: 29.6 % — AB (ref 39.0–52.0)
Hemoglobin: 9.6 g/dL — ABNORMAL LOW (ref 13.0–17.0)
MCH: 30.7 pg (ref 26.0–34.0)
MCHC: 32.4 g/dL (ref 30.0–36.0)
MCV: 94.6 fL (ref 78.0–100.0)
Platelets: 524 10*3/uL — ABNORMAL HIGH (ref 150–400)
RBC: 3.13 MIL/uL — ABNORMAL LOW (ref 4.22–5.81)
RDW: 13.6 % (ref 11.5–15.5)
WBC: 10 10*3/uL (ref 4.0–10.5)

## 2017-09-25 LAB — MAGNESIUM: MAGNESIUM: 2 mg/dL (ref 1.7–2.4)

## 2017-09-25 MED ORDER — LISINOPRIL 10 MG PO TABS: 10.0000 mg | ORAL_TABLET | Freq: Every day | ORAL | 0 refills | Status: DC

## 2017-09-25 MED ORDER — METHOCARBAMOL 500 MG PO TABS: 500.0000 mg | ORAL_TABLET | Freq: Four times a day (QID) | ORAL | 0 refills | Status: DC | PRN

## 2017-09-25 MED ORDER — TRAMADOL HCL 50 MG PO TABS: 50.0000 mg | ORAL_TABLET | Freq: Four times a day (QID) | ORAL | 0 refills | Status: DC | PRN

## 2017-09-25 MED ORDER — METOPROLOL SUCCINATE ER 100 MG PO TB24: 100.0000 mg | ORAL_TABLET | Freq: Every day | ORAL | 0 refills | Status: DC

## 2017-09-25 MED ORDER — ENOXAPARIN SODIUM 40 MG/0.4ML ~~LOC~~ SOLN: 40.0000 mg | SUBCUTANEOUS | 0 refills | Status: DC

## 2017-09-25 NOTE — Discharge Summary (Addendum)
Physician Discharge Summary  Robert Fitzgerald:[id number] DOB: 1941/02/02 DOA: 09/16/2017  PCP: [name], MD  Admit date: 09/16/2017 Discharge date: 09/26/2017  Recommendations for Outpatient Follow-up:  1. Pt will need to follow up with PCP in 1 week post discharge 2. Please obtain BMP to evaluate electrolytes and kidney function 3. Please also check CBC to evaluate Hg and Hct levels 4. Please note that pt has been discharged on metoprolol per cardiology team recommendations. Intermittent bradycardia noted but HR is stable this AM. Please check HR prior to giving Metoprolol and if HR < 55, HOLD METOPROLOL and consult with cardiology if dose can be decreased or there is need to stop beta blocker  5. Please note instructions for Lovenox for DVT prophylaxis outlined below    Discharge Diagnoses:  Active Problems:   Essential hypertension   SVT (supraventricular tachycardia) (HCC)   Prostate cancer screening   Hip fracture [facility])  Discharge Condition: Stable  Diet recommendation: regular diet   History of present illness:  76 year old male with a history of gastroesophageal reflux disease, hyperglycemia, dyslipidemia, hypertension who presented to ED after an episode of fall and was unable to get up after the event. CT hip with comminuted intertrochanteric fracture.   Assessment and plan:  SVT - in NSR this AM, keep on lopressor per cardiology  - please check HR prior to giving Metoprolol and if HR < 55 please notify MD and hold Metoprolol  - ECHO with EF 35-40% - K and Mg are WNL   Post op delirium, vascular dementia  - Recent MRI brain done on 8/28 ordered by neurology showed multiple lacunar infarct.  - still intermittently confused but calm and pleasant   Chronic combined systolic and diastolic heart failure - no signs of volume overload on exam this AM - can resume ACEI on discharge  - weight trend in the past 72 hours  Filed Weights   09/23/17 0551  09/24/17 0855 09/25/17 0510  Weight: 74.6 kg (164 lb 7.4 oz) 76.6 kg (168 lb 14 oz) 76.7 kg (169 lb 1.5 oz)   Leukocytosis - suspected to be reactive - resolved   Hypokalemia - WNL   Post op anemia - no evidence of active bleeding  HTN - resume ACEI on discharge   Severe malnutrition in context of acute illness/injury - appreciate nutritionist following   R femur fx, s/p IM nail per Dr. Lequita Halt 09/16/2017 - pt fell and sustained intertrochanteric right hip fracture with displaced lesser trochanter   Dysphagia - regular diet   Code Status: full Family Communication: daughter updated over the phone, her questions were answered to her satisfaction. She also has my cell phone number to call me should any other questions arise.  Disposition Plan: SNF  Consultants:  Ortho  cardiology  Procedures:  Intramedullary nailing,Rightintertrochanteric femur on 9/21  Antibiotics:  Perioperative ancef  Procedures/Studies: Ct Hip Right Wo Contrast  Result Date: 09/16/2017 CLINICAL DATA:  Right hip pain after fall. EXAM: CT OF THE RIGHT HIP WITHOUT CONTRAST TECHNIQUE: Multidetector CT imaging of the right hip was performed according to the standard protocol. Multiplanar CT image reconstructions were also generated. COMPARISON:  Right hip x-rays from same date. FINDINGS: Bones/Joint/Cartilage There is a comminuted intertrochanteric fracture, with medial and superior displacement of the lesser trochanter. No other fractures identified. No dislocation. Moderate degenerative changes of the right hip and sacroiliac joints. Osteopenia. Ligaments Suboptimally assessed by CT. Muscles and Tendons No focal abnormality. Soft tissues Moderate amount of  hematoma about the fracture site, extending into the adductor compartment. Edema/hematoma is also seen along the proximal superficial femoral vessels. IMPRESSION: Comminuted intertrochanteric fracture with displaced lesser trochanter. Moderate  surrounding hematoma extending into the adductor compartment. Electronically Signed   By: Obie Dredge M.D.   On: 09/16/2017 11:34   Chest Portable 1 View  Result Date: 09/16/2017 CLINICAL DATA:  76 year old male status post fall this morning with right hip fracture. EXAM: PORTABLE CHEST 1 VIEW COMPARISON:  06/14/2017. FINDINGS: Portable AP supine view at 1144 hours. Stable lung volumes. Stable cardiac size and mediastinal contours. Visualized tracheal air column is within normal limits. Allowing for portable technique the lungs are clear. No pneumothorax or pleural effusion. IMPRESSION: No acute cardiopulmonary abnormality. Electronically Signed   By: Odessa Fleming M.D.   On: 09/16/2017 12:04   Dg C-arm 1-60 Min-no Report  Result Date: 09/16/2017 Fluoroscopy was utilized by the requesting physician.  No radiographic interpretation.   Dg Hip Port Unilat With Pelvis 1v Right  Result Date: 09/16/2017 CLINICAL DATA:  Right hip surgery EXAM: DG HIP (WITH OR WITHOUT PELVIS) 1V PORT RIGHT COMPARISON:  09/16/2017 FINDINGS: Three low resolution intraoperative spot views of the right hip. The images demonstrate intramedullary rod and distal screw fixation of the proximal right femur for intertrochanteric fracture. Total fluoroscopy time was 31 seconds. IMPRESSION: Intraoperative fluoroscopic assistance provided during surgical fixation of right femoral fracture Electronically Signed   By: Jasmine Pang M.D.   On: 09/16/2017 21:20   Dg Hip Unilat With Pelvis 2-3 Views Right  Result Date: 09/16/2017 CLINICAL DATA:  Fall, right hip pain EXAM: DG HIP (WITH OR WITHOUT PELVIS) 2-3V RIGHT COMPARISON:  None. FINDINGS: Irregular linear lucency extending from the greater trochanter for towards a displaced lesser trochanter favors an intertrochanteric right hip fracture. Bilateral hip joint spaces are preserved. Visualized bony pelvis appears intact. Mild degenerative changes of the lower lumbar spine. IMPRESSION:  Suspected intertrochanteric right hip fracture with displaced lesser trochanter. Electronically Signed   By: Charline Bills M.D.   On: 09/16/2017 09:50   Dg Femur 1v Right  Result Date: 09/16/2017 CLINICAL DATA:  Fall, right hip pain EXAM: RIGHT FEMUR 1 VIEW COMPARISON:  None. FINDINGS: Suspected nondisplaced intertrochanteric right hip fracture, poorly visualized. Displaced lesser trochanter. Distal femur appears intact. IMPRESSION: Suspected nondisplaced intertrochanteric right hip fracture, poorly visualized. Displaced lesser trochanter. Electronically Signed   By: Charline Bills M.D.   On: 09/16/2017 09:49    Discharge Exam: Vitals:   09/25/17 0510 09/25/17 1020  BP: (!) 149/71 (!) 170/78  Pulse: (!) 48 73  Resp: 16 18  Temp: 97.9 F (36.6 C)   SpO2: 95% 100%   Vitals:   09/24/17 1326 09/24/17 2158 09/25/17 0510 09/25/17 1020  BP:  125/60 (!) 149/71 (!) 170/78  Pulse:  (!) 55 (!) 48 73  Resp: 20 16 16 18   Temp:  98.1 F (36.7 C) 97.9 F (36.6 C)   TempSrc: Oral Oral Oral   SpO2:  98% 95% 100%  Weight:   76.7 kg (169 lb 1.5 oz)   Height:       General: Pt is alert but confused  Cardiovascular: Regular rate and rhythm,  no rubs, no gallops Respiratory: Clear to auscultation bilaterally, no wheezing, diminished breath sounds at bases  Abdominal: Soft, non tender, non distended, bowel sounds +, no guarding  Discharge Instructions  Discharge Instructions    Call MD / Call 911    Complete by:  As directed  If you experience chest pain or shortness of breath, CALL 911 and be transported to the hospital emergency room.  If you develope a fever above 101 F, pus (white drainage) or increased drainage or redness at the wound, or calf pain, call your surgeon's office.   Change dressing    Complete by:  As directed    You may change your dressing dressing daily with sterile 4 x 4 inch gauze dressing and paper tape.  Do not submerge the incision under water.   Diet - low  sodium heart healthy    Complete by:  As directed    Discharge instructions    Complete by:  As directed    Lovenox injections for one more week, then switch over to a baby 81 mg Aspirin daily for four additional weeks.  Pick up stool softner and laxative for home use following surgery while on pain medications. Do not submerge incision under water. Please use good hand washing techniques while changing dressing each day. May shower starting three days after surgery. Please use a clean towel to pat the incision dry following showers. Continue to use ice for pain and swelling after surgery. Do not use any lotions or creams on the incision until instructed by your surgeon.  Wear both TED hose on both legs during the day every day for three weeks, but may remove the TED hose at night at home.  Postoperative Constipation Protocol  Constipation - defined medically as fewer than three stools per week and severe constipation as less than one stool per week.  One of the most common issues patients have following surgery is constipation.  Even if you have a regular bowel pattern at home, your normal regimen is likely to be disrupted due to multiple reasons following surgery.  Combination of anesthesia, postoperative narcotics, change in appetite and fluid intake all can affect your bowels.  In order to avoid complications following surgery, here are some recommendations in order to help you during your recovery period.  Colace (docusate) - Pick up an over-the-counter form of Colace or another stool softener and take twice a day as long as you are requiring postoperative pain medications.  Take with a full glass of water daily.  If you experience loose stools or diarrhea, hold the colace until you stool forms back up.  If your symptoms do not get better within 1 week or if they get worse, check with your doctor.  Dulcolax (bisacodyl) - Pick up over-the-counter and take as directed by the product  packaging as needed to assist with the movement of your bowels.  Take with a full glass of water.  Use this product as needed if not relieved by Colace only.   MiraLax (polyethylene glycol) - Pick up over-the-counter to have on hand.  MiraLax is a solution that will increase the amount of water in your bowels to assist with bowel movements.  Take as directed and can mix with a glass of water, juice, soda, coffee, or tea.  Take if you go more than two days without a movement. Do not use MiraLax more than once per day. Call your doctor if you are still constipated or irregular after using this medication for 7 days in a row.  If you continue to have problems with postoperative constipation, please contact the office for further assistance and recommendations.  If you experience "the worst abdominal pain ever" or develop nausea or vomiting, please contact the office immediatly for further recommendations for treatment.  Do not sit on low chairs, stoools or toilet seats, as it may be difficult to get up from low surfaces    Complete by:  As directed    Driving restrictions    Complete by:  As directed    No driving until released by the physician.   Increase activity slowly    Complete by:  As directed    Increase activity slowly as tolerated    Complete by:  As directed    Lifting restrictions    Complete by:  As directed    No lifting until released by the physician.   Patient may shower    Complete by:  As directed    You may shower without a dressing once there is no drainage.  Do not wash over the wound.  If drainage remains, do not shower until drainage stops.   TED hose    Complete by:  As directed    Use stockings (TED hose) for 3 weeks on both leg(s).  You may remove them at night for sleeping.   Weight bearing as tolerated    Complete by:  As directed      Allergies as of 09/25/2017      Reactions   Aspirin    REACTION: GI upset   Codeine Sulfate    REACTION: nausea   Naproxen  Sodium    REACTION: GI upset      Medication List    STOP taking these medications   sildenafil 100 MG tablet Commonly known as:  VIAGRA     TAKE these medications   acetaminophen 325 MG tablet Commonly known as:  TYLENOL Take 2 tablets (650 mg total) by mouth every 6 (six) hours as needed for mild pain (or Fever >/= 101).   enoxaparin 40 MG/0.4ML injection Commonly known as:  LOVENOX Inject 0.4 mLs (40 mg total) into the skin daily. Lovenox injections for one more week, then switch over to a baby 81 mg Aspirin daily for four additional weeks.   lisinopril 10 MG tablet Commonly known as:  PRINIVIL,ZESTRIL Take 1 tablet (10 mg total) by mouth daily. What changed:  medication strength  how much to take   methocarbamol 500 MG tablet Commonly known as:  ROBAXIN Take 1 tablet (500 mg total) by mouth every 6 (six) hours as needed for muscle spasms.   metoprolol succinate 100 MG 24 hr tablet Commonly known as:  TOPROL-XL Take 1 tablet (100 mg total) by mouth daily. Take with or immediately following a meal.   polyethylene glycol packet Commonly known as:  MIRALAX / GLYCOLAX Take 17 g by mouth daily as needed for mild constipation.   QUEtiapine 25 MG tablet Commonly known as:  SEROQUEL Take 1 tablet (25 mg total) by mouth 2 (two) times daily.   senna-docusate 8.6-50 MG tablet Commonly known as:  Senokot-S Take 1 tablet by mouth 2 (two) times daily.   traMADol 50 MG tablet Commonly known as:  ULTRAM Take 1-2 tablets (50-100 mg total) by mouth every 6 (six) hours as needed for moderate pain.            Discharge Care Instructions        Start     Ordered   09/25/17 0000  enoxaparin (LOVENOX) 40 MG/0.4ML injection  Every 24 hours     09/25/17 0835   09/25/17 0000  methocarbamol (ROBAXIN) 500 MG tablet  Every 6 hours PRN     09/25/17 0835   09/25/17 0000  traMADol (ULTRAM) 50 MG tablet  Every 6 hours PRN     09/25/17 0835   09/25/17 0000  lisinopril  (PRINIVIL,ZESTRIL) 10 MG tablet  Daily     09/25/17 0835   09/25/17 0000  metoprolol succinate (TOPROL-XL) 100 MG 24 hr tablet  Daily     09/25/17 1026   09/25/17 0000  Increase activity slowly     09/25/17 1026   09/25/17 0000  Diet - low sodium heart healthy     09/25/17 1026   09/24/17 0000  polyethylene glycol (MIRALAX / GLYCOLAX) packet  Daily PRN     09/24/17 1422   09/24/17 0000  QUEtiapine (SEROQUEL) 25 MG tablet  2 times daily     09/24/17 1422   09/24/17 0000  senna-docusate (SENOKOT-S) 8.6-50 MG tablet  2 times daily     09/24/17 1422   09/23/17 0000  acetaminophen (TYLENOL) 325 MG tablet  Every 6 hours PRN    Question:  Supervising Provider  Answer:  Ollen Gross   09/23/17 0753   09/23/17 0000  Call MD / Call 911    Comments:  If you experience chest pain or shortness of breath, CALL 911 and be transported to the hospital emergency room.  If you develope a fever above 101 F, pus (white drainage) or increased drainage or redness at the wound, or calf pain, call your surgeon's office.   09/23/17 0753   09/23/17 0000  Discharge instructions    Comments:  Lovenox injections for one more week, then switch over to a baby 81 mg Aspirin daily for four additional weeks.  Pick up stool softner and laxative for home use following surgery while on pain medications. Do not submerge incision under water. Please use good hand washing techniques while changing dressing each day. May shower starting three days after surgery. Please use a clean towel to pat the incision dry following showers. Continue to use ice for pain and swelling after surgery. Do not use any lotions or creams on the incision until instructed by your surgeon.  Wear both TED hose on both legs during the day every day for three weeks, but may remove the TED hose at night at home.  Postoperative Constipation Protocol  Constipation - defined medically as fewer than three stools per week and severe constipation as less  than one stool per week.  One of the most common issues patients have following surgery is constipation.  Even if you have a regular bowel pattern at home, your normal regimen is likely to be disrupted due to multiple reasons following surgery.  Combination of anesthesia, postoperative narcotics, change in appetite and fluid intake all can affect your bowels.  In order to avoid complications following surgery, here are some recommendations in order to help you during your recovery period.  Colace (docusate) - Pick up an over-the-counter form of Colace or another stool softener and take twice a day as long as you are requiring postoperative pain medications.  Take with a full glass of water daily.  If you experience loose stools or diarrhea, hold the colace until you stool forms back up.  If your symptoms do not get better within 1 week or if they get worse, check with your doctor.  Dulcolax (bisacodyl) - Pick up over-the-counter and take as directed by the product packaging as needed to assist with the movement of your bowels.  Take with a full glass of water.  Use this product as needed if not relieved by Colace  only.   MiraLax (polyethylene glycol) - Pick up over-the-counter to have on hand.  MiraLax is a solution that will increase the amount of water in your bowels to assist with bowel movements.  Take as directed and can mix with a glass of water, juice, soda, coffee, or tea.  Take if you go more than two days without a movement. Do not use MiraLax more than once per day. Call your doctor if you are still constipated or irregular after using this medication for 7 days in a row.  If you continue to have problems with postoperative constipation, please contact the office for further assistance and recommendations.  If you experience "the worst abdominal pain ever" or develop nausea or vomiting, please contact the office immediatly for further recommendations for treatment.   09/23/17 0753   09/23/17  0000  Increase activity slowly as tolerated     09/23/17 0753   09/23/17 0000  Patient may shower    Comments:  You may shower without a dressing once there is no drainage.  Do not wash over the wound.  If drainage remains, do not shower until drainage stops.   09/23/17 0753   09/23/17 0000  Weight bearing as tolerated     09/23/17 0753   09/23/17 0000  Driving restrictions    Comments:  No driving until released by the physician.   09/23/17 0753   09/23/17 0000  Lifting restrictions    Comments:  No lifting until released by the physician.   09/23/17 0753   09/23/17 0000  Change dressing    Comments:  You may change your dressing dressing daily with sterile 4 x 4 inch gauze dressing and paper tape.  Do not submerge the incision under water.   09/23/17 0753   09/23/17 0000  TED hose    Comments:  Use stockings (TED hose) for 3 weeks on both leg(s).  You may remove them at night for sleeping.   09/23/17 0753   09/23/17 0000  Do not sit on low chairs, stoools or toilet seats, as it may be difficult to get up from low surfaces     09/23/17 0753     Follow-up Information    Ollen Gross, MD. Schedule an appointment as soon as possible for a visit on 10/04/2017.   Specialty:  Orthopedic Surgery Why:  Call office ASAP at [phone] to setup follow up appt for patient with Dr. Lequita Halt on Tuesday 10/04/2017. Contact information: [address] [place] [address] [phone]        [name], MD Follow up.   Specialties:  Family Medicine, Radiology Contact information: [address] [place] [address] [phone]            The results of significant diagnostics from this hospitalization (including imaging, microbiology, ancillary and laboratory) are listed below for reference.     Microbiology: No results found for this or any previous visit (from the past 240 hour(s)).   Labs: Basic Metabolic Panel:  Recent Labs Lab 09/19/17 0726  09/20/17 5784 09/21/17 0458 09/22/17 0708 09/23/17 0550 09/25/17 0652  NA 139 139 139 139 141 140  K 3.9 3.8 3.6 3.2* 3.7 3.8  CL 109 103 104 100* 103 103  CO2 GLUCOSE 164* 142* 133* 134* 126* 118*  BUN 23* 20  CREATININE 1.03 0.93 0.86 0.88 0.96 0.85  CALCIUM 8.1* 8.5* 8.5* 8.9 9.3 9.0  MG 1.9 2.1  --   --  2.1 2.0   Liver Function Tests:  Recent Labs Lab 09/20/17 0628 09/21/17 0458  AST 52* 68*  ALT 23 37  ALKPHOS 62 59  BILITOT 1.2 1.5*  PROT 6.8 6.4*  ALBUMIN 3.3* 3.1*    Recent Labs Lab 09/19/17 0726  AMMONIA 27   CBC:  Recent Labs Lab 09/20/17 0628 09/21/17 0458 09/22/17 0708 09/23/17 0550 09/25/17 0652  WBC 11.3* 8.9 10.3 9.4 10.0  NEUTROABS 7.8*  --   --   --   --   HGB 9.1* 9.0* 9.7* 10.0* 9.6*  HCT 26.3* 26.0* 28.5* 29.8* 29.6*  MCV 92.9 91.2 91.1 91.4 94.6  PLT 259 306 416* 475* 524*   Cardiac Enzymes:  Recent Labs Lab 09/19/17 1424 09/19/17 1617 09/19/17 2319  TROPONINI 0.18* 0.19* 0.19*   SIGNED: Time coordinating discharge: 60 minutes  Debbora Presto, MD  [facility] 09/25/2017, 10:27 AM Pager [phone]  If 7PM-7AM, please contact night-coverage [email or web address] Password [email or web address]

## 2017-09-25 NOTE — Clinical Social Work Placement (Signed)
Nurse to call report to [phone], Room 3214    CLINICAL SOCIAL WORK PLACEMENT  NOTE  Date:  09/25/2017  Patient Details  Name: Robert Fitzgerald MRN: [id number] Date of Birth: Sep 05, 1941  Clinical Social Work is seeking post-discharge placement for this patient at the Skilled  Nursing Facility level of care (*CSW will initial, date and re-position this form in  chart as items are completed):  Yes   Patient/family provided with [facility] Clinical Social Work Department's list of facilities offering this level of care within the geographic area requested by the patient (or if unable, by the patient's family).  Yes   Patient/family informed of their freedom to choose among providers that offer the needed level of care, that participate in Medicare, Medicaid or managed care program needed by the patient, have an available bed and are willing to accept the patient.  Yes   Patient/family informed of [facility]'s ownership interest in [facility] and [facility], as well as of the fact that they are under no obligation to receive care at these facilities.  PASRR submitted to EDS on 09/22/17     PASRR number received on 09/22/17     Existing PASRR number confirmed on       FL2 transmitted to all facilities in geographic area requested by pt/family on 09/22/17     FL2 transmitted to all facilities within larger geographic area on       Patient informed that his/her managed care company has contracts with or will negotiate with certain facilities, including the following:        Yes   Patient/family informed of bed offers received.  Patient chooses bed at [facility]     Physician recommends and patient chooses bed at      Patient to be transferred to [facility] on 09/25/17.  Patient to be transferred to facility by [facility]     Patient family notified on 09/25/17 of transfer.  Name of family member notified:  Deana     PHYSICIAN        Additional Comment:    _______________________________________________ Baldemar Lenis, LCSW 09/25/2017, 12:02 PM

## 2017-09-25 NOTE — Care Management Note (Signed)
Case Management Note  Patient Details  Name: Robert Fitzgerald MRN: [id number] Date of Birth: 22-Jun-1941  Subjective/Objective:   HTN, SVT,  Post op delirium, CHF                Action/Plan: Discharge Planning: Chart reviewed. CSW following for SNF placement. Scheduled dc today to SNF.   PCP  Roxy Manns A MD  Expected Discharge Date:  09/25/17               Expected Discharge Plan:  Skilled Nursing Facility  In-House Referral:  Clinical Social Work  Discharge planning Services  CM Consult  Post Acute Care Choice:  NA Choice offered to:  NA  DME Arranged:  N/A DME Agency:  NA  HH Arranged:  NA HH Agency:  NA  Status of Service:  Completed, signed off  If discussed at [employer] of Stay Meetings, dates discussed:    Additional Comments:  Elliot Cousin, RN 09/25/2017, 12:41 PM

## 2017-09-25 NOTE — Progress Notes (Signed)
Pt discharged to [facility] rehab center. Left unit on stretcher pushed by ambulance staff. Left in stable condition accompanied by wife, dtr and son-in-law. Report called and given to [facility], LPN at nursing facility. All nurse's questions answered to her satisfaction. Left my number [phone]) for Nurse to call in case she had any questions. Maree Erie, rn.

## 2017-09-26 DIAGNOSIS — F0151 Vascular dementia with behavioral disturbance: Secondary | ICD-10-CM | POA: Diagnosis not present

## 2017-09-26 DIAGNOSIS — I5042 Chronic combined systolic (congestive) and diastolic (congestive) heart failure: Secondary | ICD-10-CM | POA: Diagnosis not present

## 2017-09-26 DIAGNOSIS — I471 Supraventricular tachycardia: Secondary | ICD-10-CM | POA: Diagnosis not present

## 2017-09-26 DIAGNOSIS — S72144D Nondisplaced intertrochanteric fracture of right femur, subsequent encounter for closed fracture with routine healing: Secondary | ICD-10-CM | POA: Diagnosis not present

## 2017-09-28 DIAGNOSIS — I10 Essential (primary) hypertension: Secondary | ICD-10-CM | POA: Diagnosis not present

## 2017-09-28 DIAGNOSIS — F0151 Vascular dementia with behavioral disturbance: Secondary | ICD-10-CM | POA: Diagnosis not present

## 2017-09-28 DIAGNOSIS — S72144D Nondisplaced intertrochanteric fracture of right femur, subsequent encounter for closed fracture with routine healing: Secondary | ICD-10-CM | POA: Diagnosis not present

## 2017-09-28 DIAGNOSIS — W19XXXD Unspecified fall, subsequent encounter: Secondary | ICD-10-CM | POA: Diagnosis not present

## 2017-09-30 DIAGNOSIS — F0391 Unspecified dementia with behavioral disturbance: Secondary | ICD-10-CM | POA: Diagnosis not present

## 2017-09-30 DIAGNOSIS — I471 Supraventricular tachycardia: Secondary | ICD-10-CM | POA: Diagnosis not present

## 2017-09-30 DIAGNOSIS — S72001A Fracture of unspecified part of neck of right femur, initial encounter for closed fracture: Secondary | ICD-10-CM | POA: Diagnosis not present

## 2017-09-30 DIAGNOSIS — I10 Essential (primary) hypertension: Secondary | ICD-10-CM | POA: Diagnosis not present

## 2017-09-30 DIAGNOSIS — R410 Disorientation, unspecified: Secondary | ICD-10-CM | POA: Diagnosis not present

## 2017-09-30 DIAGNOSIS — E46 Unspecified protein-calorie malnutrition: Secondary | ICD-10-CM | POA: Diagnosis not present

## 2017-09-30 DIAGNOSIS — R4702 Dysphasia: Secondary | ICD-10-CM | POA: Diagnosis not present

## 2017-10-03 ENCOUNTER — Other Ambulatory Visit: Payer: Self-pay | Admitting: *Deleted

## 2017-10-03 NOTE — Patient Outreach (Signed)
[facility]) Care Management  10/03/2017  Robert Fitzgerald May 20, 1941 [id number]   Met with patient and wife at facility.  Patient has vascular dementia and history of falls. Wife is caregiver, she had a seizure and had a fall with head injury at hospital and now has her own issues. She reports their daughter is assisting them.   RNCM reviewed [facility] care management program services, left brochure including RNCM contact with patient wife for daughter to review.  Plan to follow up as indicated at next facility visit.  Royetta Crochet. Laymond Purser, RN, BSN, [place] [phone]) Business Cell  [phone]) Toll Free Office

## 2017-10-04 DIAGNOSIS — S72144D Nondisplaced intertrochanteric fracture of right femur, subsequent encounter for closed fracture with routine healing: Secondary | ICD-10-CM | POA: Diagnosis not present

## 2017-10-05 ENCOUNTER — Other Ambulatory Visit: Payer: Self-pay | Admitting: Family Medicine

## 2017-10-05 DIAGNOSIS — I5042 Chronic combined systolic (congestive) and diastolic (congestive) heart failure: Secondary | ICD-10-CM | POA: Diagnosis not present

## 2017-10-05 DIAGNOSIS — I471 Supraventricular tachycardia: Secondary | ICD-10-CM | POA: Diagnosis not present

## 2017-10-05 DIAGNOSIS — S72144D Nondisplaced intertrochanteric fracture of right femur, subsequent encounter for closed fracture with routine healing: Secondary | ICD-10-CM | POA: Diagnosis not present

## 2017-10-05 DIAGNOSIS — F0151 Vascular dementia with behavioral disturbance: Secondary | ICD-10-CM | POA: Diagnosis not present

## 2017-10-05 NOTE — Telephone Encounter (Signed)
Not on med list but last time it was filled was on 06/14/17 #180 tabs with 0 refills, CPE scheduled for 12/16/17, please advise

## 2017-10-05 NOTE — Telephone Encounter (Signed)
He was recently in the hospital for hip fracture (may still be in SNF now)  The d/c him with generic robaxin instead of flexeril   Which is he taking?

## 2017-10-06 NOTE — Telephone Encounter (Signed)
Left voicemail requesting pt's wife to call the office back and let me know which medication is pt taking

## 2017-10-10 NOTE — Telephone Encounter (Signed)
Spoke to patient's wife and was advised that he is still at the SNF and will be there at least the rest of the week. Mrs. Robert Fitzgerald stated that she does not know what all he is on at the facility, but they did change his medications before he left the hospital. Patient's wife stated that she is probably a automatic refill which does not think that she needs that now.

## 2017-10-10 NOTE — Telephone Encounter (Signed)
Go ahead and decline it for now then, thanks

## 2017-10-10 NOTE — Telephone Encounter (Signed)
Mrs Butner left v/m returning call and request cb.

## 2017-10-14 ENCOUNTER — Other Ambulatory Visit: Payer: Self-pay | Admitting: *Deleted

## 2017-10-14 DIAGNOSIS — S72144D Nondisplaced intertrochanteric fracture of right femur, subsequent encounter for closed fracture with routine healing: Secondary | ICD-10-CM | POA: Diagnosis not present

## 2017-10-14 DIAGNOSIS — I5042 Chronic combined systolic (congestive) and diastolic (congestive) heart failure: Secondary | ICD-10-CM | POA: Diagnosis not present

## 2017-10-14 DIAGNOSIS — I10 Essential (primary) hypertension: Secondary | ICD-10-CM | POA: Diagnosis not present

## 2017-10-14 DIAGNOSIS — F0151 Vascular dementia with behavioral disturbance: Secondary | ICD-10-CM | POA: Diagnosis not present

## 2017-10-14 NOTE — Progress Notes (Signed)
This encounter was created in error - please disregard.

## 2017-10-14 NOTE — Addendum Note (Signed)
Addended by: Verdie DrownNIEMCZURA, Shaheer Bonfield E on: 10/14/2017 02:23 PM   Modules accepted: Level of Service, SmartSet

## 2017-10-14 NOTE — Patient Outreach (Signed)
[place] [facility]) Care Management  10/14/2017  Robert Fitzgerald 1941/09/27 [id number]   Met with patient and wife at facility. Patient eating lunch.  RNCM briefly discussed [facility] care management.  Left brochure with wife.  Plan to follow up at next facility visit. Royetta Crochet. Laymond Purser, RN, BSN, [place] [phone]) Business Cell  [phone]) Toll Free Office

## 2017-10-16 DIAGNOSIS — I5042 Chronic combined systolic (congestive) and diastolic (congestive) heart failure: Secondary | ICD-10-CM | POA: Diagnosis not present

## 2017-10-16 DIAGNOSIS — R41841 Cognitive communication deficit: Secondary | ICD-10-CM | POA: Diagnosis not present

## 2017-10-16 DIAGNOSIS — F0150 Vascular dementia without behavioral disturbance: Secondary | ICD-10-CM | POA: Diagnosis not present

## 2017-10-16 DIAGNOSIS — R1312 Dysphagia, oropharyngeal phase: Secondary | ICD-10-CM | POA: Diagnosis not present

## 2017-10-16 DIAGNOSIS — I110 Hypertensive heart disease with heart failure: Secondary | ICD-10-CM | POA: Diagnosis not present

## 2017-10-16 DIAGNOSIS — M6281 Muscle weakness (generalized): Secondary | ICD-10-CM | POA: Diagnosis not present

## 2017-10-16 DIAGNOSIS — R278 Other lack of coordination: Secondary | ICD-10-CM | POA: Diagnosis not present

## 2017-10-16 DIAGNOSIS — S72001D Fracture of unspecified part of neck of right femur, subsequent encounter for closed fracture with routine healing: Secondary | ICD-10-CM | POA: Diagnosis not present

## 2017-10-17 ENCOUNTER — Telehealth: Payer: Self-pay | Admitting: Family Medicine

## 2017-10-17 DIAGNOSIS — F0150 Vascular dementia without behavioral disturbance: Secondary | ICD-10-CM | POA: Diagnosis not present

## 2017-10-17 DIAGNOSIS — I5042 Chronic combined systolic (congestive) and diastolic (congestive) heart failure: Secondary | ICD-10-CM | POA: Diagnosis not present

## 2017-10-17 DIAGNOSIS — M6281 Muscle weakness (generalized): Secondary | ICD-10-CM | POA: Diagnosis not present

## 2017-10-17 DIAGNOSIS — S72001D Fracture of unspecified part of neck of right femur, subsequent encounter for closed fracture with routine healing: Secondary | ICD-10-CM | POA: Diagnosis not present

## 2017-10-17 DIAGNOSIS — R278 Other lack of coordination: Secondary | ICD-10-CM | POA: Diagnosis not present

## 2017-10-17 DIAGNOSIS — I110 Hypertensive heart disease with heart failure: Secondary | ICD-10-CM | POA: Diagnosis not present

## 2017-10-17 NOTE — Telephone Encounter (Signed)
Copied from CRM #[id number]. Topic: Referral - Request >> Oct 17, 2017  1:55 PM Cecelia ByarsGreen, Temeka L, [place] wrote: Reason for CRM: Tamela OddiBetsy from [facility] for verbal orders to continue physical therapy 2 times a week x6 weeks, please return call at [phone].    Please ok the verbal orders

## 2017-10-17 NOTE — Telephone Encounter (Signed)
Verbal order given to [facility]

## 2017-10-18 ENCOUNTER — Ambulatory Visit: Payer: Self-pay | Admitting: *Deleted

## 2017-10-18 NOTE — Telephone Encounter (Signed)
Please hold the lisinopril entirely  Update me later this week with how bp is Also pulse if possible  Thanks- hope he is feeling ok

## 2017-10-18 NOTE — Telephone Encounter (Signed)
   Reason for Disposition . [1] Systolic BP 90-110 AND [2] taking blood pressure medications AND [3] NOT dizzy, lightheaded or weak  Answer Assessment - Initial Assessment Questions 1. BLOOD PRESSURE: "What is the blood pressure?" "Did you take at least two measurements 5 minutes apart?"     90/65  2. ONSET: "When did you take your blood pressure?"     BP taken by nurse on 10/22 3. HOW: "How did you obtain the blood pressure?" (e.g., visiting nurse, automatic home BP monitor)     Visiting nurse 4. HISTORY: "Do you have a history of low blood pressure?" "What is your blood pressure normally?"     No, blood pressure has been normal, according to pt's wife 5. MEDICATIONS: "Are you taking any medications for blood pressure?" If yes: "Have they been changed recently?"     Lisinopril 5mg  daily, Metoprolol 100mg  recently added 6. PULSE RATE: "Do you know what your pulse rate is?"      unknown 7. OTHER SYMPTOMS: "Have you been sick recently?" "Have you had a recent injury?"     Recently discharged home on Saturday, after being at nursing home  Protocols used: LOW BLOOD PRESSURE-A-AH

## 2017-10-18 NOTE — Telephone Encounter (Signed)
Wife notified of Dr. Royden Purlower's comments and instructions and she verbalized understanding. Wife said when PT checks his vitals she will call and update us with them

## 2017-10-19 ENCOUNTER — Telehealth: Payer: Self-pay | Admitting: Family Medicine

## 2017-10-19 DIAGNOSIS — F0150 Vascular dementia without behavioral disturbance: Secondary | ICD-10-CM | POA: Diagnosis not present

## 2017-10-19 DIAGNOSIS — I110 Hypertensive heart disease with heart failure: Secondary | ICD-10-CM | POA: Diagnosis not present

## 2017-10-19 DIAGNOSIS — I5042 Chronic combined systolic (congestive) and diastolic (congestive) heart failure: Secondary | ICD-10-CM | POA: Diagnosis not present

## 2017-10-19 DIAGNOSIS — R278 Other lack of coordination: Secondary | ICD-10-CM | POA: Diagnosis not present

## 2017-10-19 DIAGNOSIS — M6281 Muscle weakness (generalized): Secondary | ICD-10-CM | POA: Diagnosis not present

## 2017-10-19 DIAGNOSIS — S72001D Fracture of unspecified part of neck of right femur, subsequent encounter for closed fracture with routine healing: Secondary | ICD-10-CM | POA: Diagnosis not present

## 2017-10-19 NOTE — Telephone Encounter (Signed)
Pt's wife notified of Dr.Tower's comments and instructions. PT comes out twice a week to check him so next time they are there she will call and update us on his BP and pulse, it may be Friday or Monday

## 2017-10-19 NOTE — Telephone Encounter (Signed)
Copied from CRM #[id number]. Topic: Inquiry >> Oct 19, 2017  4:14 PM Alexander Fitzgerald, Robert B wrote: Reason for CRM: PT is calling to notify Dr. Milinda Antisower of his BP 98/58 and his pulse was a 88   Give it a few more days off the lisinopril and update on Friday  Thanks

## 2017-10-20 ENCOUNTER — Telehealth: Payer: Self-pay | Admitting: Family Medicine

## 2017-10-20 ENCOUNTER — Telehealth: Payer: Self-pay

## 2017-10-20 ENCOUNTER — Ambulatory Visit: Payer: Self-pay | Admitting: *Deleted

## 2017-10-20 DIAGNOSIS — M6281 Muscle weakness (generalized): Secondary | ICD-10-CM | POA: Diagnosis not present

## 2017-10-20 DIAGNOSIS — R278 Other lack of coordination: Secondary | ICD-10-CM | POA: Diagnosis not present

## 2017-10-20 DIAGNOSIS — F0150 Vascular dementia without behavioral disturbance: Secondary | ICD-10-CM | POA: Diagnosis not present

## 2017-10-20 DIAGNOSIS — I110 Hypertensive heart disease with heart failure: Secondary | ICD-10-CM | POA: Diagnosis not present

## 2017-10-20 DIAGNOSIS — S72001D Fracture of unspecified part of neck of right femur, subsequent encounter for closed fracture with routine healing: Secondary | ICD-10-CM | POA: Diagnosis not present

## 2017-10-20 DIAGNOSIS — I5042 Chronic combined systolic (congestive) and diastolic (congestive) heart failure: Secondary | ICD-10-CM | POA: Diagnosis not present

## 2017-10-20 NOTE — Telephone Encounter (Signed)
This encounter was created in error - please disregard.

## 2017-10-20 NOTE — Telephone Encounter (Signed)
   Reason for Disposition . Leg pain  Answer Assessment - Initial Assessment Questions 1. LOCATION and RADIATION: "Where is the pain located?"      Whole right leg 2. QUALITY: "What does the pain feel like?"  (e.g., sharp, dull, aching, burning)     aching 3. SEVERITY: "How bad is the pain?" "What does it keep you from doing?"   (Scale 1-10; or mild, moderate, severe)   -  MILD (1-3): doesn't interfere with normal activities    -  MODERATE (4-7): interferes with normal activities (e.g., work or school) or awakens from sleep, limping    -  SEVERE (8-10): excruciating pain, unable to do any normal activities, unable to walk     8 4. ONSET: "When did the pain start?" "Does it come and go, or is it there all the time?"     Since surgery in September 5. WORK OR EXERCISE: "Has there been any recent work or exercise that involved this part of the body?"      Not sure 6. CAUSE: "What do you think is causing the hip pain?"      unknown 7. AGGRAVATING FACTORS: "What makes the hip pain worse?" (e.g., walking, climbing stairs, running)     no 8. OTHER SYMPTOMS: "Do you have any other symptoms?" (e.g., back pain, pain shooting down leg,  fever, rash)     no  Protocols used: LEG PAIN-A-AH, HIP PAIN-A-AH  Speech therapist called regarding pain that this patient is having. States he has been having pain since he fell and fractured his hip and had surgery. There is no swelling, redness or numbness per ST as noted from RN. Speech Therapist instructed the patient and wife to try to get the pain med on a schedule for now to help with the pain. So after taking the pain med and it does not improve, notify the provider. The Therapist stated that she would do this and that the nurse would be visiting them today and she would pass that on to her to remind the patient and wife.

## 2017-10-20 NOTE — Telephone Encounter (Signed)
See nurse triage encounter for 10/25.

## 2017-10-20 NOTE — Telephone Encounter (Deleted)
Wife Clenton PareKathleen Scialdone called to report a BP of 90/50 which was taken on Monday. [facility] doctor because nurse stated it was "low". Today she reports 2 BP's: noon BP: 140/80 HR 88 and 30 minutes prior to call 126/80 HR 88.  Wife states she has not been giving him his metoprolol since Saturday. She stated that she has not been giving him his lisnopril 5 mg the past couple of days.

## 2017-10-20 NOTE — Telephone Encounter (Signed)
Copied from CRM [id number]. Topic: Quick Communication - See Telephone Encounter >> Oct 20, 2017 12:44 PM Everardo PacificMoton, Elzie Sheets, [place] wrote: CRM for notification. See Telephone encounter for:  10/20/17. Patient having pain to his right leg and hip due to a fracture. Patient is taking tramadol every 6 hours

## 2017-10-20 NOTE — Telephone Encounter (Signed)
Wife Kathleen Hummel called to report a BP of 90/50 which was taken on Monday. Wife states the HH nurse requested her to report BP doctor because nurse stated it was "low". Today she reports 2 BP's: noon BP: 140/80 HR 88 and 30 minutes prior to call 126/80 HR 88.  Wife states she has not been giving him his metoprolol since Saturday. She stated that she has not been giving him his lisnopril 5 mg the past couple of days. 

## 2017-10-21 DIAGNOSIS — R278 Other lack of coordination: Secondary | ICD-10-CM | POA: Diagnosis not present

## 2017-10-21 DIAGNOSIS — M6281 Muscle weakness (generalized): Secondary | ICD-10-CM | POA: Diagnosis not present

## 2017-10-21 DIAGNOSIS — F0150 Vascular dementia without behavioral disturbance: Secondary | ICD-10-CM | POA: Diagnosis not present

## 2017-10-21 DIAGNOSIS — I5042 Chronic combined systolic (congestive) and diastolic (congestive) heart failure: Secondary | ICD-10-CM | POA: Diagnosis not present

## 2017-10-21 DIAGNOSIS — S72001D Fracture of unspecified part of neck of right femur, subsequent encounter for closed fracture with routine healing: Secondary | ICD-10-CM | POA: Diagnosis not present

## 2017-10-21 DIAGNOSIS — I110 Hypertensive heart disease with heart failure: Secondary | ICD-10-CM | POA: Diagnosis not present

## 2017-10-21 NOTE — Telephone Encounter (Signed)
Pt's wife notified of Dr. Royden Purlower's comments and verbalized understanding

## 2017-10-21 NOTE — Telephone Encounter (Signed)
I was not aware she was not giving him metoprolol- we need to watch for elevated HR and keep me posted  Proceed with the current plan and see how bp is over the next week  If up significantly we will add something back

## 2017-10-24 ENCOUNTER — Ambulatory Visit: Payer: Self-pay

## 2017-10-24 DIAGNOSIS — I5042 Chronic combined systolic (congestive) and diastolic (congestive) heart failure: Secondary | ICD-10-CM | POA: Diagnosis not present

## 2017-10-24 DIAGNOSIS — I110 Hypertensive heart disease with heart failure: Secondary | ICD-10-CM | POA: Diagnosis not present

## 2017-10-24 DIAGNOSIS — S72001D Fracture of unspecified part of neck of right femur, subsequent encounter for closed fracture with routine healing: Secondary | ICD-10-CM | POA: Diagnosis not present

## 2017-10-24 DIAGNOSIS — F0150 Vascular dementia without behavioral disturbance: Secondary | ICD-10-CM | POA: Diagnosis not present

## 2017-10-24 DIAGNOSIS — M6281 Muscle weakness (generalized): Secondary | ICD-10-CM | POA: Diagnosis not present

## 2017-10-24 DIAGNOSIS — R278 Other lack of coordination: Secondary | ICD-10-CM | POA: Diagnosis not present

## 2017-10-24 NOTE — Telephone Encounter (Signed)
Pt.'s wife called to report vital signs taken by home health nurse : BP 116/64 pulse 91. Wife states he is tired today from company they had yesterday. No other complaints.

## 2017-10-26 DIAGNOSIS — M6281 Muscle weakness (generalized): Secondary | ICD-10-CM | POA: Diagnosis not present

## 2017-10-26 DIAGNOSIS — I110 Hypertensive heart disease with heart failure: Secondary | ICD-10-CM | POA: Diagnosis not present

## 2017-10-26 DIAGNOSIS — I5042 Chronic combined systolic (congestive) and diastolic (congestive) heart failure: Secondary | ICD-10-CM | POA: Diagnosis not present

## 2017-10-26 DIAGNOSIS — F0150 Vascular dementia without behavioral disturbance: Secondary | ICD-10-CM | POA: Diagnosis not present

## 2017-10-26 DIAGNOSIS — R278 Other lack of coordination: Secondary | ICD-10-CM | POA: Diagnosis not present

## 2017-10-26 DIAGNOSIS — S72001D Fracture of unspecified part of neck of right femur, subsequent encounter for closed fracture with routine healing: Secondary | ICD-10-CM | POA: Diagnosis not present

## 2017-10-31 ENCOUNTER — Telehealth: Payer: Self-pay | Admitting: *Deleted

## 2017-10-31 DIAGNOSIS — F0150 Vascular dementia without behavioral disturbance: Secondary | ICD-10-CM | POA: Diagnosis not present

## 2017-10-31 DIAGNOSIS — I110 Hypertensive heart disease with heart failure: Secondary | ICD-10-CM | POA: Diagnosis not present

## 2017-10-31 DIAGNOSIS — S72001D Fracture of unspecified part of neck of right femur, subsequent encounter for closed fracture with routine healing: Secondary | ICD-10-CM | POA: Diagnosis not present

## 2017-10-31 DIAGNOSIS — I5042 Chronic combined systolic (congestive) and diastolic (congestive) heart failure: Secondary | ICD-10-CM | POA: Diagnosis not present

## 2017-10-31 DIAGNOSIS — R278 Other lack of coordination: Secondary | ICD-10-CM | POA: Diagnosis not present

## 2017-10-31 DIAGNOSIS — M6281 Muscle weakness (generalized): Secondary | ICD-10-CM | POA: Diagnosis not present

## 2017-10-31 NOTE — Telephone Encounter (Signed)
Copied from CRM [id number]. Topic: General - Other >> Oct 31, 2017 12:16 PM Viviann SpareWhite, Selina wrote: Reason for CRM: [facility]ann[facility]Gray [facility]amin[facility] [phone] would like for Dr. Milinda Antisower nurse to give him a call. He calling to give and update on the patient progress.

## 2017-10-31 NOTE — Telephone Encounter (Signed)
Left voicemail requesting Jillyn HiddenGary, PT to call the office back

## 2017-11-01 DIAGNOSIS — S72144D Nondisplaced intertrochanteric fracture of right femur, subsequent encounter for closed fracture with routine healing: Secondary | ICD-10-CM | POA: Diagnosis not present

## 2017-11-04 DIAGNOSIS — S72001D Fracture of unspecified part of neck of right femur, subsequent encounter for closed fracture with routine healing: Secondary | ICD-10-CM | POA: Diagnosis not present

## 2017-11-04 DIAGNOSIS — I5042 Chronic combined systolic (congestive) and diastolic (congestive) heart failure: Secondary | ICD-10-CM | POA: Diagnosis not present

## 2017-11-04 DIAGNOSIS — M6281 Muscle weakness (generalized): Secondary | ICD-10-CM | POA: Diagnosis not present

## 2017-11-04 DIAGNOSIS — I110 Hypertensive heart disease with heart failure: Secondary | ICD-10-CM | POA: Diagnosis not present

## 2017-11-04 DIAGNOSIS — R278 Other lack of coordination: Secondary | ICD-10-CM | POA: Diagnosis not present

## 2017-11-04 DIAGNOSIS — F0150 Vascular dementia without behavioral disturbance: Secondary | ICD-10-CM | POA: Diagnosis not present

## 2017-11-04 NOTE — Telephone Encounter (Signed)
Called Wallace CullensGray again but no answer, left VM requesting him to update pt

## 2017-11-08 DIAGNOSIS — F0150 Vascular dementia without behavioral disturbance: Secondary | ICD-10-CM | POA: Diagnosis not present

## 2017-11-08 DIAGNOSIS — R278 Other lack of coordination: Secondary | ICD-10-CM | POA: Diagnosis not present

## 2017-11-08 DIAGNOSIS — I5042 Chronic combined systolic (congestive) and diastolic (congestive) heart failure: Secondary | ICD-10-CM | POA: Diagnosis not present

## 2017-11-08 DIAGNOSIS — I110 Hypertensive heart disease with heart failure: Secondary | ICD-10-CM | POA: Diagnosis not present

## 2017-11-08 DIAGNOSIS — S72001D Fracture of unspecified part of neck of right femur, subsequent encounter for closed fracture with routine healing: Secondary | ICD-10-CM | POA: Diagnosis not present

## 2017-11-08 DIAGNOSIS — M6281 Muscle weakness (generalized): Secondary | ICD-10-CM | POA: Diagnosis not present

## 2017-11-18 DIAGNOSIS — R278 Other lack of coordination: Secondary | ICD-10-CM | POA: Diagnosis not present

## 2017-11-18 DIAGNOSIS — M6281 Muscle weakness (generalized): Secondary | ICD-10-CM | POA: Diagnosis not present

## 2017-11-18 DIAGNOSIS — S72001D Fracture of unspecified part of neck of right femur, subsequent encounter for closed fracture with routine healing: Secondary | ICD-10-CM | POA: Diagnosis not present

## 2017-11-18 DIAGNOSIS — I5042 Chronic combined systolic (congestive) and diastolic (congestive) heart failure: Secondary | ICD-10-CM | POA: Diagnosis not present

## 2017-11-18 DIAGNOSIS — I110 Hypertensive heart disease with heart failure: Secondary | ICD-10-CM | POA: Diagnosis not present

## 2017-11-18 DIAGNOSIS — F0150 Vascular dementia without behavioral disturbance: Secondary | ICD-10-CM | POA: Diagnosis not present

## 2017-11-20 IMAGING — CR DG HIP (WITH OR WITHOUT PELVIS) 2-3V*R*
3 series · 3 of 3 positions shown · non-contrast
Comparison: None.

CLINICAL DATA: Fall, right hip pain

EXAM:
DG HIP (WITH OR WITHOUT PELVIS) 2-3V RIGHT

[x hip lat right]
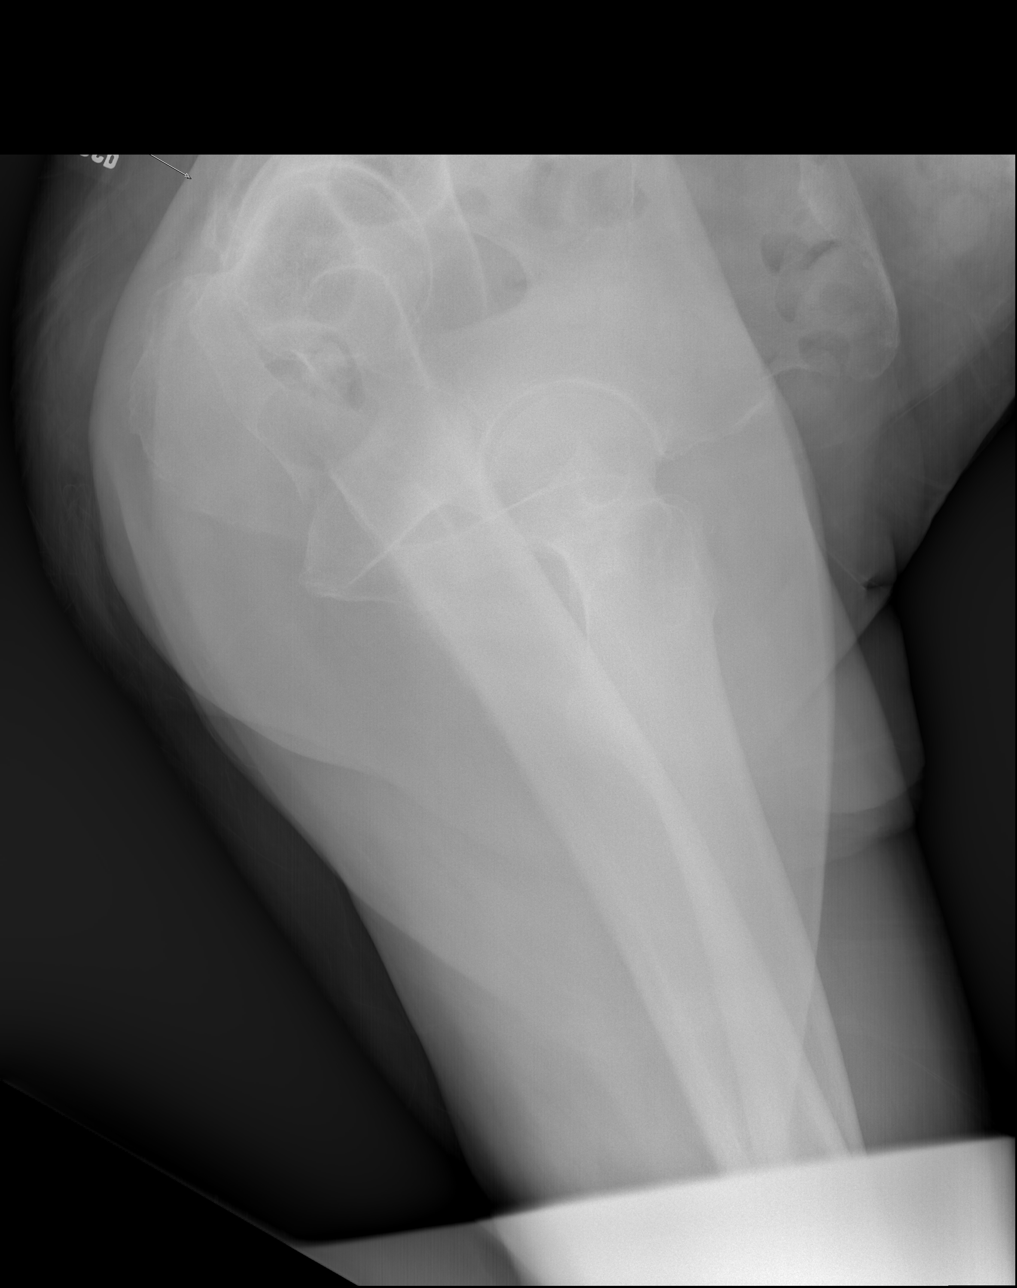

[w pelvis upright]
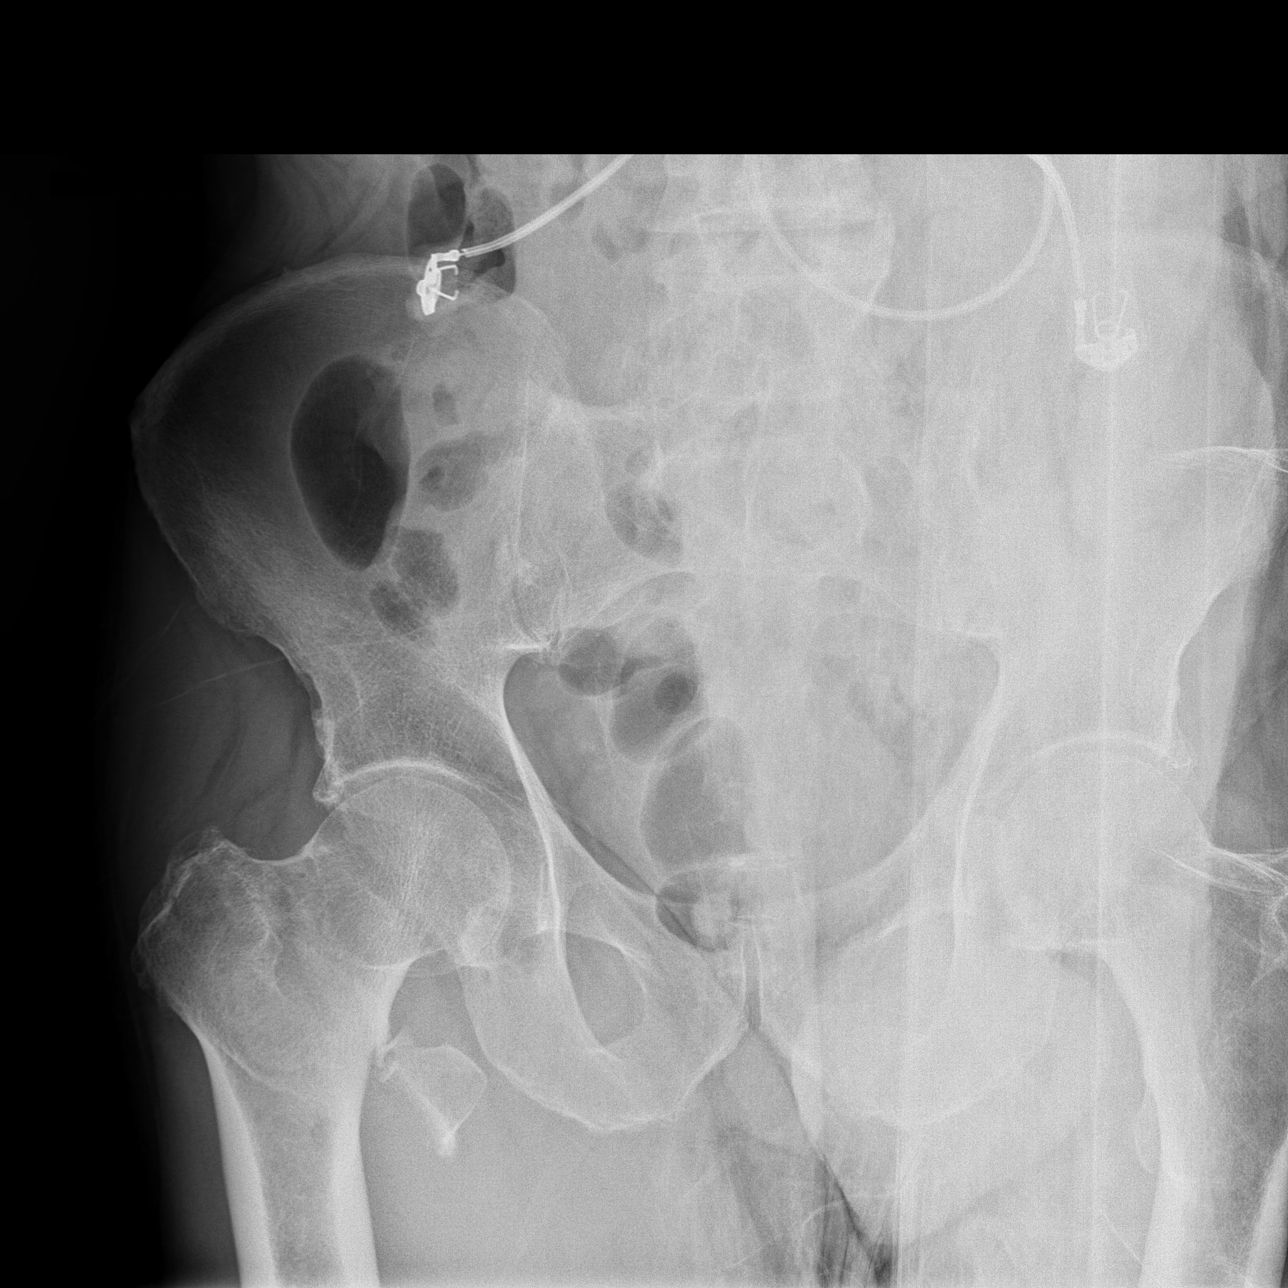

[w hip lat right]
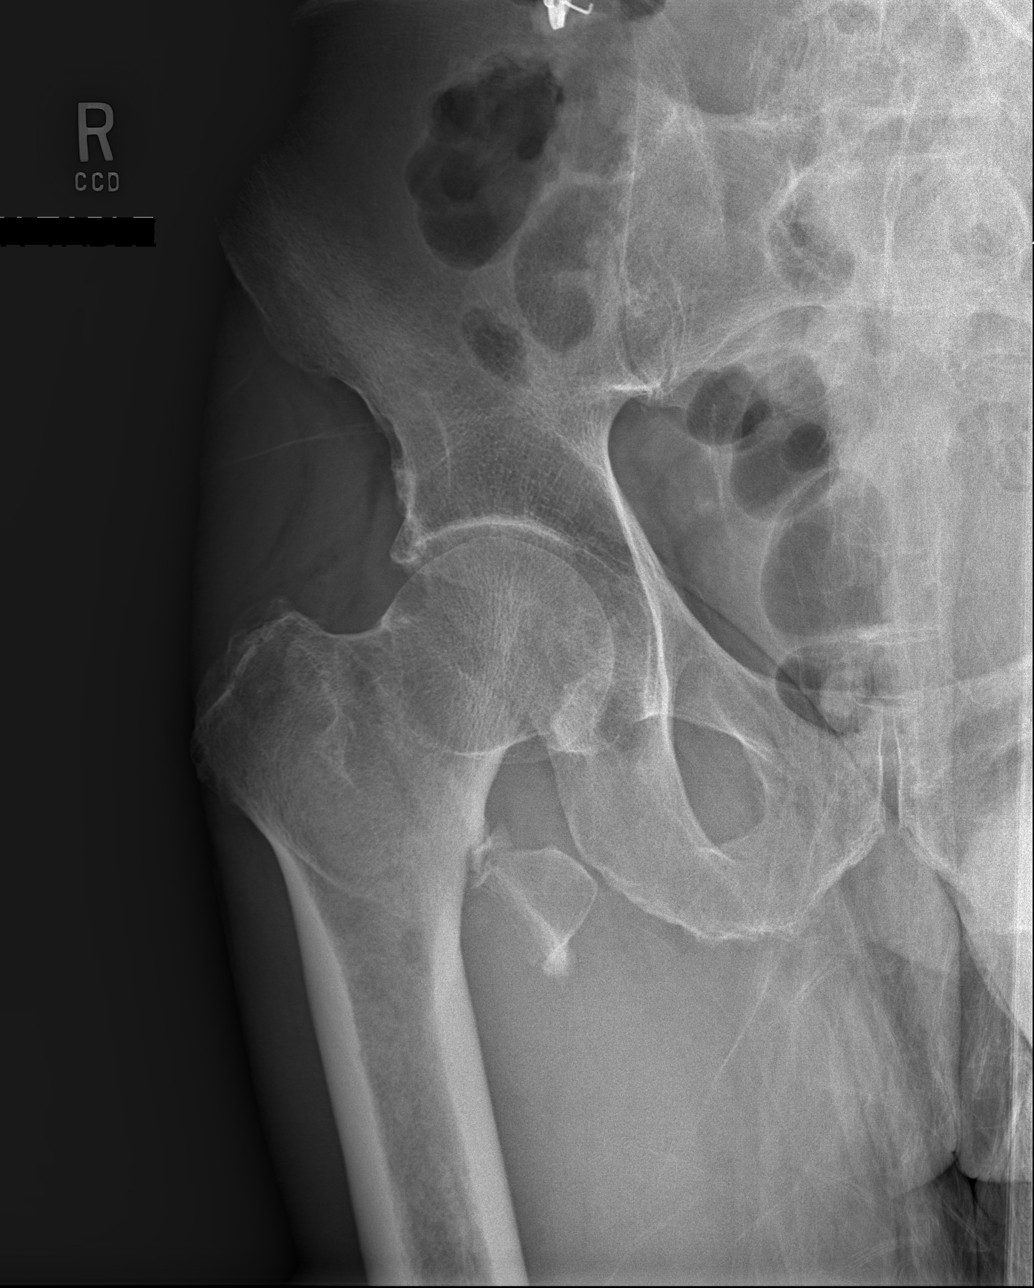

[3 of 3 positions shown; findings below may reference images not displayed]

FINDINGS: Irregular linear lucency extending from the greater trochanter for
towards a displaced lesser trochanter favors an intertrochanteric
right hip fracture.

Bilateral hip joint spaces are preserved.

Visualized bony pelvis appears intact.

Mild degenerative changes of the lower lumbar spine.
IMPRESSION: Suspected intertrochanteric right hip fracture with displaced lesser
trochanter.

## 2017-11-20 IMAGING — CR DG FEMUR 1V*R*
2 series · 2 of 2 positions shown · non-contrast
Comparison: None.

CLINICAL DATA: Fall, right hip pain

EXAM:
RIGHT FEMUR 1 VIEW

[w femur distal lat right (1 of 2)]
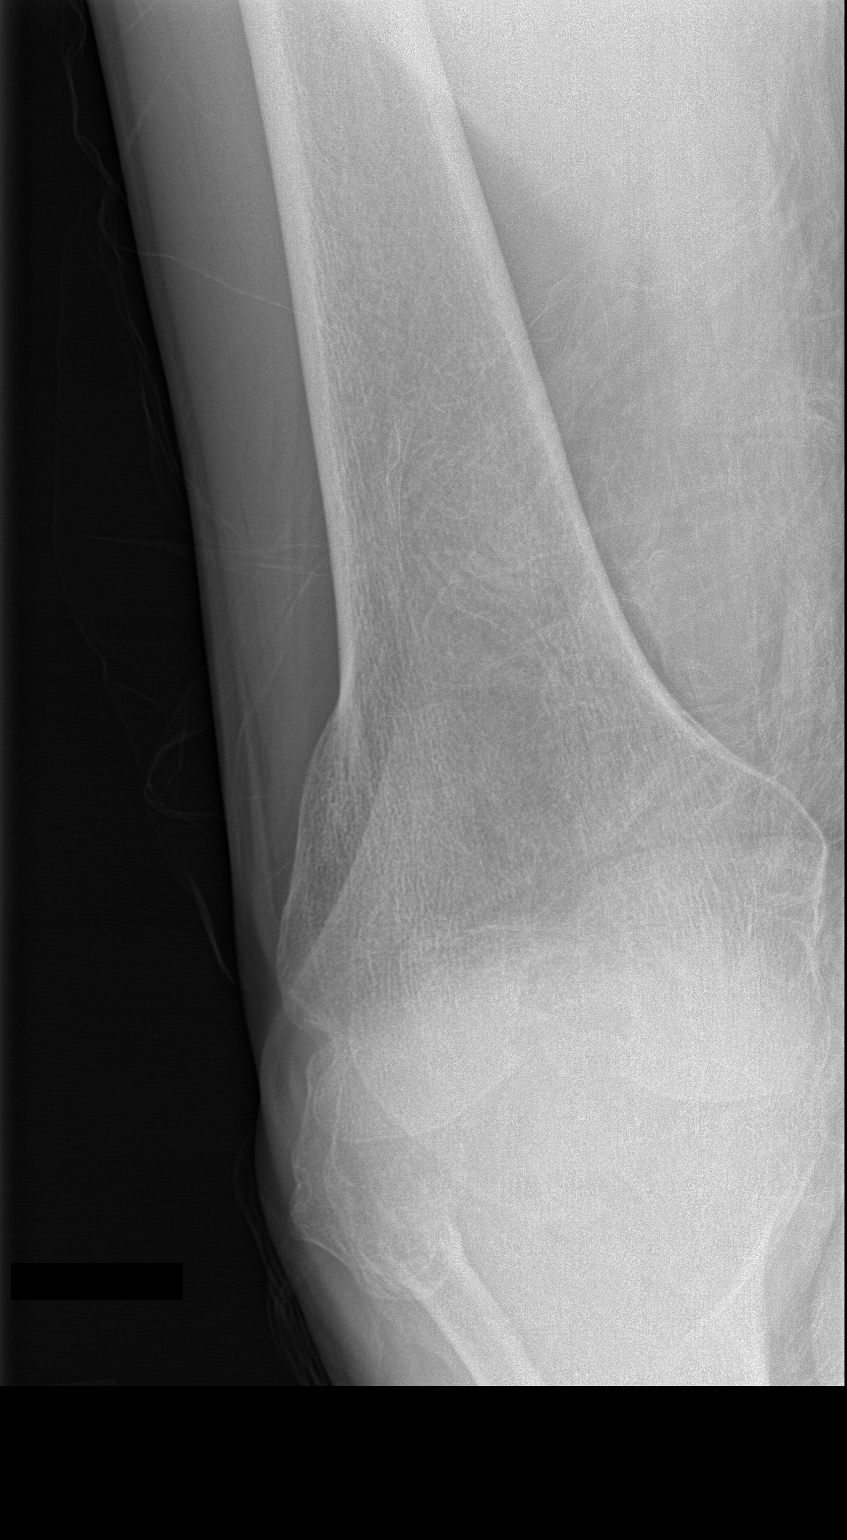

[w femur distal lat right (2 of 2)]
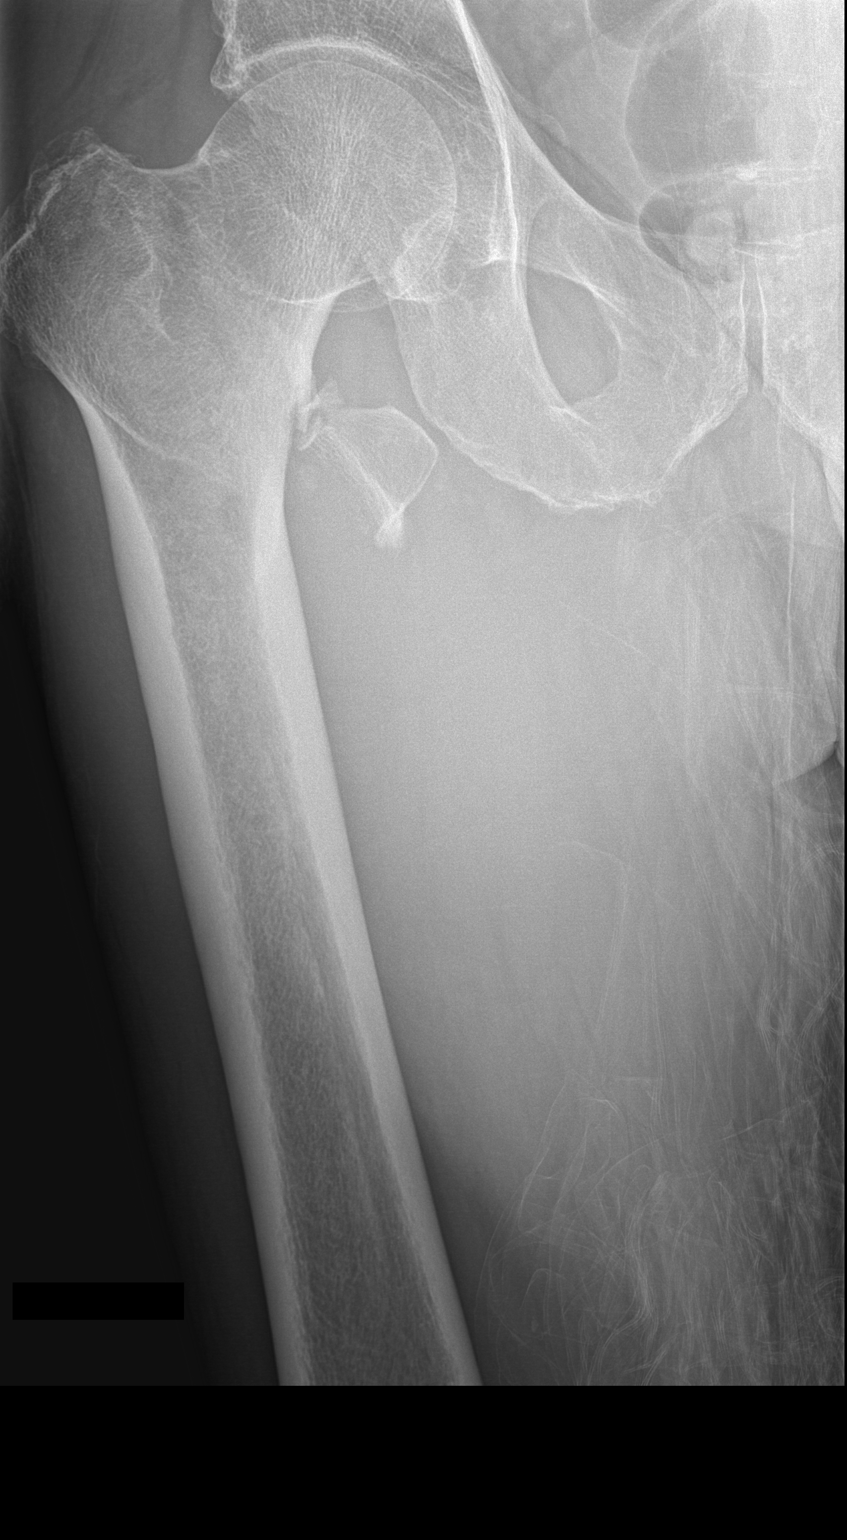

[2 of 2 positions shown; findings below may reference images not displayed]

FINDINGS: Suspected nondisplaced intertrochanteric right hip fracture, poorly
visualized.

Displaced lesser trochanter.

Distal femur appears intact.
IMPRESSION: Suspected nondisplaced intertrochanteric right hip fracture, poorly
visualized.

Displaced lesser trochanter.

## 2017-11-20 IMAGING — DX DG CHEST 1V PORT
1 series · 1 of 1 positions shown · non-contrast
Comparison: 06/14/2017.

CLINICAL DATA: 76-year-old male status post fall this morning with
right hip fracture.

EXAM:
PORTABLE CHEST 1 VIEW

[chest ap]
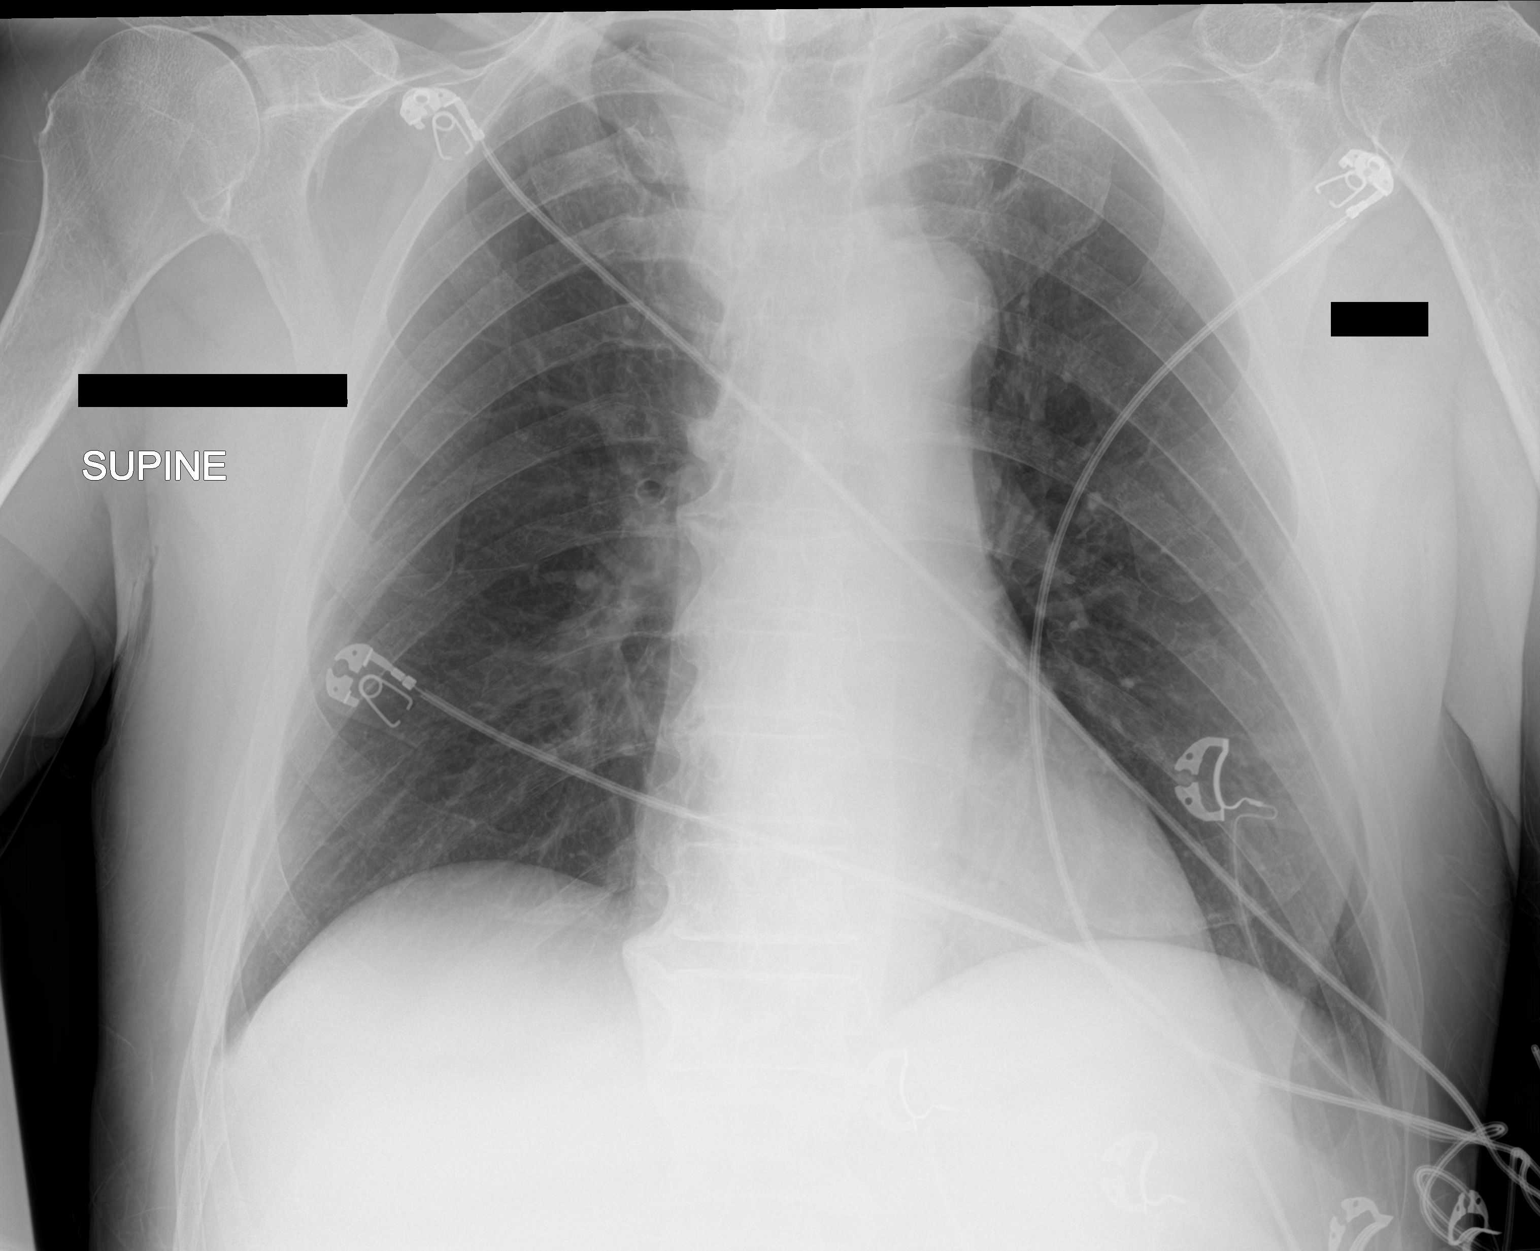

[1 of 1 positions shown; findings below may reference images not displayed]

FINDINGS: Portable AP supine view at 5566 hours. Stable lung volumes. Stable
cardiac size and mediastinal contours. Visualized tracheal air
column is within normal limits. Allowing for portable technique the
lungs are clear. No pneumothorax or pleural effusion.
IMPRESSION: No acute cardiopulmonary abnormality.

## 2017-12-12 ENCOUNTER — Ambulatory Visit (INDEPENDENT_AMBULATORY_CARE_PROVIDER_SITE_OTHER): Payer: Medicare Other

## 2017-12-12 VITALS — BP 118/78 | HR 69 | Temp 97.6°F | Ht 72.5 in | Wt 159.5 lb

## 2017-12-12 DIAGNOSIS — I10 Essential (primary) hypertension: Secondary | ICD-10-CM

## 2017-12-12 DIAGNOSIS — E782 Mixed hyperlipidemia: Secondary | ICD-10-CM

## 2017-12-12 DIAGNOSIS — Z125 Encounter for screening for malignant neoplasm of prostate: Secondary | ICD-10-CM | POA: Diagnosis not present

## 2017-12-12 DIAGNOSIS — R739 Hyperglycemia, unspecified: Secondary | ICD-10-CM | POA: Diagnosis not present

## 2017-12-12 DIAGNOSIS — Z0000 Encounter for general adult medical examination without abnormal findings: Secondary | ICD-10-CM | POA: Diagnosis not present

## 2017-12-12 DIAGNOSIS — D582 Other hemoglobinopathies: Secondary | ICD-10-CM

## 2017-12-12 LAB — COMPREHENSIVE METABOLIC PANEL
ALT: 12 U/L (ref 0–53)
AST: 16 U/L (ref 0–37)
Albumin: 4 g/dL (ref 3.5–5.2)
Alkaline Phosphatase: 110 U/L (ref 39–117)
BUN: 19 mg/dL (ref 6–23)
CO2: 32 meq/L (ref 19–32)
Calcium: 9.7 mg/dL (ref 8.4–10.5)
Chloride: 107 mEq/L (ref 96–112)
Creatinine, Ser: 1.02 mg/dL (ref 0.40–1.50)
GFR: 75.49 mL/min (ref >60.00)
GLUCOSE: 101 mg/dL — AB (ref 70–99)
POTASSIUM: 5.3 meq/L — AB (ref 3.5–5.1)
SODIUM: 145 meq/L (ref 135–145)
Total Bilirubin: 0.4 mg/dL (ref 0.2–1.2)
Total Protein: 7 g/dL (ref 6.0–8.3)

## 2017-12-12 LAB — LIPID PANEL
CHOL/HDL RATIO: 4
Cholesterol: 146 mg/dL (ref 0–200)
HDL: 33 mg/dL — AB (ref >39.00)
LDL Cholesterol: 85 mg/dL (ref 0–99)
NONHDL: 113.11
Triglycerides: 142 mg/dL (ref 0.0–149.0)
VLDL: 28.4 mg/dL (ref 0.0–40.0)

## 2017-12-12 LAB — PSA, MEDICARE: PSA: 0.62 ng/ml (ref 0.10–4.00)

## 2017-12-12 LAB — CBC WITH DIFFERENTIAL/PLATELET
BASOS PCT: 1 % (ref 0.0–3.0)
Basophils Absolute: 0.1 10*3/uL (ref 0.0–0.1)
EOS PCT: 3.7 % (ref 0.0–5.0)
Eosinophils Absolute: 0.3 10*3/uL (ref 0.0–0.7)
HCT: 41.5 % (ref 39.0–52.0)
Hemoglobin: 13.2 g/dL (ref 13.0–17.0)
LYMPHS ABS: 3.3 10*3/uL (ref 0.7–4.0)
Lymphocytes Relative: 35.9 % (ref 12.0–46.0)
MCHC: 31.8 g/dL (ref 30.0–36.0)
MCV: 91.3 fl (ref 78.0–100.0)
MONOS PCT: 10.9 % (ref 3.0–12.0)
Monocytes Absolute: 1 10*3/uL (ref 0.1–1.0)
NEUTROS ABS: 4.4 10*3/uL (ref 1.4–7.7)
NEUTROS PCT: 48.5 % (ref 43.0–77.0)
PLATELETS: 264 10*3/uL (ref 150.0–400.0)
RBC: 4.54 Mil/uL (ref 4.22–5.81)
RDW: 15.4 % (ref 11.5–15.5)
WBC: 9.1 10*3/uL (ref 4.0–10.5)

## 2017-12-12 LAB — HEMOGLOBIN A1C: Hgb A1c MFr Bld: 6.2 % (ref 4.6–6.5)

## 2017-12-12 LAB — TSH: TSH: 2.62 u[IU]/mL (ref 0.35–4.50)

## 2017-12-12 NOTE — Progress Notes (Signed)
Pre visit review using our clinic review tool, if applicable. No additional management support is needed unless otherwise documented below in the visit note. 

## 2017-12-12 NOTE — Progress Notes (Signed)
PCP notes:   Health maintenance:  Foot exam - PCP please address at next appt Eye exam - addressed Flu vaccine - per spouse, vaccine in Sept 2018  Abnormal screenings:    Fall risk - hx of fall with injury and medical treatment Mini-Cog score: 15/20 Hearing - failed  Hearing Screening   125Hz  250Hz  500Hz  1000Hz  2000Hz  3000Hz  4000Hz  6000Hz  8000Hz   Right ear:   40 40 40  0    Left ear:   0 40 40  0     Patient concerns:   Pt is still recovering from fall.   Nurse concerns:  Medication management - PCP please address medications with spouse to ensure correct medications are being taken.  Next PCP appt:   12/16/17 @ 1515  I reviewed health advisor's note, was available for consultation, and agree with documentation and plan. Roxy MannsMarne Tower MD

## 2017-12-12 NOTE — Patient Instructions (Signed)
[name] , Thank you for taking time to come for your Medicare Wellness Visit. I appreciate your ongoing commitment to your health goals. Please review the following plan we discussed and let me know if I can assist you in the future.   These are the goals we discussed: Goals    . Follow up with Primary Care Provider     Starting 12/12/2017, I will continue to take medications as prescribed and follow up with PCP and other specialists as scheduled.        This is a list of the screening recommended for you and due dates:  Health Maintenance  Topic Date Due  . Eye exam for diabetics  12/26/2018*  . Complete foot exam   12/14/2017  . Hemoglobin A1C  12/14/2017  . Colon Cancer Screening  09/21/2020  . Tetanus Vaccine  02/13/2023  . Flu Shot  Completed  . Pneumonia vaccines  Completed  *Topic was postponed. The date shown is not the original due date.   Preventive Care for Adults  A healthy lifestyle and preventive care can promote health and wellness. Preventive health guidelines for adults include the following key practices.  . A routine yearly physical is a good way to check with your health care provider about your health and preventive screening. It is a chance to share any concerns and updates on your health and to receive a thorough exam.  . Visit your dentist for a routine exam and preventive care every 6 months. Brush your teeth twice a day and floss once a day. Good oral hygiene prevents tooth decay and gum disease.  . The frequency of eye exams is based on your age, health, family medical history, use  of contact lenses, and other factors. Follow your health care provider's recommendations for frequency of eye exams.  . Eat a healthy diet. Foods like vegetables, fruits, whole grains, low-fat dairy products, and lean protein foods contain the nutrients you need without too many calories. Decrease your intake of foods high in solid fats, added sugars, and salt. Eat the  right amount of calories for you. Get information about a proper diet from your health care provider, if necessary.  . Regular physical exercise is one of the most important things you can do for your health. Most adults should get at least 150 minutes of moderate-intensity exercise (any activity that increases your heart rate and causes you to sweat) each week. In addition, most adults need muscle-strengthening exercises on 2 or more days a week.  Silver Sneakers may be a benefit available to you. To determine eligibility, you may visit the website: www.silversneakers.com or contact program at [phone] Mon-Fri between 8AM-8PM.   . Maintain a healthy weight. The body mass index (BMI) is a screening tool to identify possible weight problems. It provides an estimate of body fat based on height and weight. Your health care provider can find your BMI and can help you achieve or maintain a healthy weight.   For adults 20 years and older: ? A BMI below 18.5 is considered underweight. ? A BMI of 18.5 to 24.9 is normal. ? A BMI of 25 to 29.9 is considered overweight. ? A BMI of 30 and above is considered obese.   . Maintain normal blood lipids and cholesterol levels by exercising and minimizing your intake of saturated fat. Eat a balanced diet with plenty of fruit and vegetables. Blood tests for lipids and cholesterol should begin at age 75 and be repeated every  5 years. If your lipid or cholesterol levels are high, you are over 50, or you are at high risk for heart disease, you may need your cholesterol levels checked more frequently. Ongoing high lipid and cholesterol levels should be treated with medicines if diet and exercise are not working.  . If you smoke, find out from your health care provider how to quit. If you do not use tobacco, please do not start.  . If you choose to drink alcohol, please do not consume more than 2 drinks per day. One drink is considered to be 12 ounces (355 mL) of  beer, 5 ounces (148 mL) of wine, or 1.5 ounces (44 mL) of liquor.  . If you are 37-41 years old, ask your health care provider if you should take aspirin to prevent strokes.  . Use sunscreen. Apply sunscreen liberally and repeatedly throughout the day. You should seek shade when your shadow is shorter than you. Protect yourself by wearing long sleeves, pants, a wide-brimmed hat, and sunglasses year round, whenever you are outdoors.  . Once a month, do a whole body skin exam, using a mirror to look at the skin on your back. Tell your health care provider of new moles, moles that have irregular borders, moles that are larger than a pencil eraser, or moles that have changed in shape or color.

## 2017-12-12 NOTE — Progress Notes (Signed)
Subjective:   Robert LangtonHerman Dean Fitzgerald is a 76 y.o. male who presents for Medicare Annual/Subsequent preventive examination.  Review of Systems:  N/A Cardiac Risk Factors include: advanced age (>7255men, 52>65 women);male gender;dyslipidemia;hypertension     Objective:    Vitals: BP 118/78 (BP Location: Right Arm, Patient Position: Sitting, Cuff Size: Normal)   Pulse 69   Temp 97.6 F (36.4 C) (Oral)   Ht 6' 0.5" (1.842 m) Comment: shoes  Wt 159 lb 8 oz (72.3 kg)   SpO2 96%   BMI 21.33 kg/m   Body mass index is 21.33 kg/m.  Advanced Directives 12/12/2017 09/16/2017 12/10/2016  Does Patient Have a Medical Advance Directive? No No No  Would patient like information on creating a medical advance directive? No - Patient declined No - Patient declined -    Tobacco Social History   Tobacco Use  Smoking Status Never Smoker  Smokeless Tobacco Never Used     Counseling given: No   Clinical Intake:  Pre-visit preparation completed: Yes  Pain : No/denies pain Pain Score: 8      Nutritional Status: BMI of 19-24  Normal Nutritional Risks: None Diabetes: No  How often do you need to have someone help you when you read instructions, pamphlets, or other written materials from your doctor or pharmacy?: 2 - Rarely What is the last grade level you completed in school?: 12th grade  Interpreter Needed?: No  Comments: pt lives with spouse Information entered by :: LPinson, LPN  Past Medical History:  Diagnosis Date  . Chronic back pain   . 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 Diverticulitis 1992  . ED (erectile dysfunction)   . GERD (gastroesophageal reflux disease)   . Headache, post-traumatic, chronic   . Hyperglycemia    Type 2, mild  . Hyperlipidemia    Diet controlled  . Hypertension   . Tinnitus    chronic   Past Surgical History:  Procedure Laterality Date  . BACK SURGERY     x 6- including fusion  . CIRCUMCISION    . FEMUR IM NAIL Right  09/16/2017   Procedure: INTRAMEDULLARY (IM) NAIL FEMORAL;  Surgeon: Ollen GrossAluisio, Frank, MD;  Location: [facility];  Service: Orthopedics;  Laterality: Right;  . NASAL SEPTUM SURGERY  1999  . SHOULDER SURGERY  1999   2011 for bone spur   Family History  Problem Relation Age of Onset  . Aneurysm Brother        Brain   Social History   Socioeconomic History  . Marital status: Married    Spouse name: Olegario MessierKathy  . Number of children: 1  . Years of education: 9812  . Highest education level: None  Social Needs  . Financial resource strain: None  . Food insecurity - worry: None  . Food insecurity - inability: None  . Transportation needs - medical: None  . Transportation needs - non-medical: None  Occupational History  . Occupation: Retired  Tobacco Use  . Smoking status: Never Smoker  . Smokeless tobacco: Never Used  Substance and Sexual Activity  . Alcohol use: No    Alcohol/week: 0.0 oz  . Drug use: No  . Sexual activity: Yes  Other Topics Concern  . None  Social History Narrative   Lives at home with wife.    Caffeine use: Coffee or tea daily   Diet-soda   Left handed    Outpatient Encounter Medications as of 12/12/2017  Medication Sig  .  acetaminophen (TYLENOL) 325 MG tablet Take 2 tablets (650 mg total) by mouth every 6 (six) hours as needed for mild pain (or Fever >/= 101).  . cyclobenzaprine (FLEXERIL) 5 MG tablet Take 5 mg by mouth 3 (three) times daily as needed for muscle spasms.  Marland Kitchen. lisinopril (PRINIVIL,ZESTRIL) 5 MG tablet Take 5 mg by mouth daily.  . sildenafil (VIAGRA) 100 MG tablet TK 1 T PO QD PRF ERECTILE DYSFUNCTION  . traMADol (ULTRAM) 50 MG tablet Take 1-2 tablets (50-100 mg total) by mouth every 6 (six) hours as needed for moderate pain.  . [DISCONTINUED] enoxaparin (LOVENOX) 40 MG/0.4ML injection Inject 0.4 mLs (40 mg total) into the skin daily. Lovenox injections for one more week, then switch over to a baby 81 mg Aspirin daily for four additional weeks.  .  [DISCONTINUED] lisinopril (PRINIVIL,ZESTRIL) 10 MG tablet Take 1 tablet (10 mg total) by mouth daily.  . [DISCONTINUED] methocarbamol (ROBAXIN) 500 MG tablet Take 1 tablet (500 mg total) by mouth every 6 (six) hours as needed for muscle spasms. (Patient not taking: Reported on 12/12/2017)  . [DISCONTINUED] metoprolol succinate (TOPROL-XL) 100 MG 24 hr tablet Take 1 tablet (100 mg total) by mouth daily. Take with or immediately following a meal. (Patient not taking: Reported on 12/12/2017)  . [DISCONTINUED] polyethylene glycol (MIRALAX / GLYCOLAX) packet Take 17 g by mouth daily as needed for mild constipation. (Patient not taking: Reported on 12/12/2017)  . [DISCONTINUED] QUEtiapine (SEROQUEL) 25 MG tablet Take 1 tablet (25 mg total) by mouth 2 (two) times daily. (Patient not taking: Reported on 12/12/2017)  . [DISCONTINUED] senna-docusate (SENOKOT-S) 8.6-50 MG tablet Take 1 tablet by mouth 2 (two) times daily. (Patient not taking: Reported on 12/12/2017)   No facility-administered encounter medications on file as of 12/12/2017.     Activities of Daily Living In your present state of health, do you have any difficulty performing the following activities: 12/12/2017 09/16/2017  Hearing? N -  Vision? N -  Difficulty concentrating or making decisions? Y -  Walking or climbing stairs? Y -  Dressing or bathing? Y -  Doing errands, shopping? Y N  Preparing Food and eating ? Y -  Using the Toilet? N -  In the past six months, have you accidently leaked urine? N -  Do you have problems with loss of bowel control? N -  Managing your Medications? Y -  Managing your Finances? Y -  Housekeeping or managing your Housekeeping? Y -  Some recent data might be hidden    Patient Care Team: Tower, Audrie GallusMarne A, MD as PCP - Veto KempsGeneral Hollander, Edward, MD as Consulting Physician (Ophthalmology)   Assessment:   This is a routine wellness examination for Robert Fitzgerald.   Hearing Screening   125Hz  250Hz  500Hz  1000Hz   2000Hz  3000Hz  4000Hz  6000Hz  8000Hz   Right ear:   40 40 40  0    Left ear:   0 40 40  0      Visual Acuity Screening   Right eye Left eye Both eyes  Without correction: 20/200 20/200 20/50-1  With correction:     Comments: Per Dr. Jimmye NormanHollander's office, last vision exam on 05/15/2015  Exercise Activities and Dietary recommendations Current Exercise Habits: The patient does not participate in regular exercise at present, Exercise limited by: orthopedic condition(s)  Goals    . Follow up with Primary Care Provider     Starting 12/12/2017, I will continue to take medications as prescribed and follow up with PCP and other specialists  as scheduled.        Fall Risk Fall Risk  12/12/2017 12/10/2016 11/04/2015 02/13/2013  Falls in the past year? Yes No No Yes  Comment Sept 2018; fall with injury and subsequent surgery - - -  Number falls in past yr: 1 - - 2 or more  Injury with Fall? Yes - - -  Risk for fall due to : Impaired balance/gait;Impaired vision;Impaired mobility;Mental status change - - Mental status change  Follow up Falls prevention discussed - - -   Depression Screen PHQ 2/9 Scores 12/12/2017 12/10/2016 11/04/2015 02/13/2013  PHQ - 2 Score 0 0 0 0  PHQ- 9 Score 0 - - -    Cognitive Function MMSE - Mini Mental State Exam 12/12/2017 08/09/2017 12/10/2016  Orientation to time 5 3 3   Orientation to time comments - - pt was not able to provide correct year; stated it 2002  Orientation to Place 5 4 5   Registration 3 3 3   Attention/ Calculation 0 2 0  Recall 0 1 0  Recall-comments unable to recall 3 of 3 words - pt was unable to recall 3 of 3 words  Language- name 2 objects 0 2 0  Language- repeat 1 1 1   Language- follow 3 step command 1 3 0  Language- follow 3 step command-comments unable to follow 2 steps of 3 step command - pt was unable to follow 3 step command despite multiple cues provided by nurse and spouse  Language- read & follow direction 0 1 0  Write a sentence 0 1 0   Copy design 0 0 0  Total score 15 21 12        PLEASE NOTE: A Mini-Cog screen was completed. Maximum score is 20. A value of 0 denotes this part of Folstein MMSE was not completed or the patient failed this part of the Mini-Cog screening.   Mini-Cog Screening Orientation to Time - Max 5 pts Orientation to Place - Max 5 pts Registration - Max 3 pts Recall - Max 3 pts Language Repeat - Max 1 pts Language Follow 3 Step Command - Max 3 pts   Immunization History  Administered Date(s) Administered  . H1N1 01/17/2009  . Influenza Split 10/19/2011, 09/18/2014  . Influenza Whole 10/08/2008, 09/10/2009, 09/26/2010  . Influenza,inj,Quad PF,6+ Mos 11/04/2015  . Influenza-Unspecified 08/27/2016, 08/27/2017  . Pneumococcal Conjugate-13 01/10/2015  . Pneumococcal Polysaccharide-23 12/27/1996, 10/08/2008  . Td 02/13/2013    Screening Tests Health Maintenance  Topic Date Due  . OPHTHALMOLOGY EXAM  12/26/2018 (Originally 07/27/2017)  . FOOT EXAM  12/14/2017  . HEMOGLOBIN A1C  12/14/2017  . COLONOSCOPY  09/21/2020  . TETANUS/TDAP  02/13/2023  . INFLUENZA VACCINE  Completed  . PNA vac Low Risk Adult  Completed     Plan:     I have personally reviewed, addressed, and noted the following in the patient's chart:  A. Medical and social history B. Use of alcohol, tobacco or illicit drugs  C. Current medications and supplements D. Functional ability and status E.  Nutritional status F.  Physical activity G. Advance directives H. List of other physicians I.  Hospitalizations, surgeries, and ER visits in previous 12 months J.  Vitals K. Screenings to include hearing, vision, cognitive, depression L. Referrals and appointments - none  In addition, I have reviewed and discussed with patient certain preventive protocols, quality metrics, and best practice recommendations. A written personalized care plan for preventive services as well as general preventive health recommendations were provided  to  patient.  See attached scanned questionnaire for additional information.   Signed,   Lindell Noe, MHA, BS, LPN Health Coach

## 2017-12-13 DIAGNOSIS — S72144D Nondisplaced intertrochanteric fracture of right femur, subsequent encounter for closed fracture with routine healing: Secondary | ICD-10-CM | POA: Diagnosis not present

## 2017-12-16 ENCOUNTER — Encounter: Payer: Self-pay | Admitting: Family Medicine

## 2017-12-16 ENCOUNTER — Ambulatory Visit (INDEPENDENT_AMBULATORY_CARE_PROVIDER_SITE_OTHER): Payer: Medicare Other | Admitting: Family Medicine

## 2017-12-16 VITALS — BP 126/64 | HR 57 | Temp 97.9°F | Ht 72.5 in | Wt 164.0 lb

## 2017-12-16 DIAGNOSIS — R413 Other amnesia: Secondary | ICD-10-CM | POA: Diagnosis not present

## 2017-12-16 DIAGNOSIS — I10 Essential (primary) hypertension: Secondary | ICD-10-CM

## 2017-12-16 DIAGNOSIS — E7800 Pure hypercholesterolemia, unspecified: Secondary | ICD-10-CM | POA: Diagnosis not present

## 2017-12-16 DIAGNOSIS — R739 Hyperglycemia, unspecified: Secondary | ICD-10-CM

## 2017-12-16 DIAGNOSIS — Z125 Encounter for screening for malignant neoplasm of prostate: Secondary | ICD-10-CM | POA: Diagnosis not present

## 2017-12-16 DIAGNOSIS — S72001D Fracture of unspecified part of neck of right femur, subsequent encounter for closed fracture with routine healing: Secondary | ICD-10-CM

## 2017-12-16 DIAGNOSIS — G8929 Other chronic pain: Secondary | ICD-10-CM

## 2017-12-16 DIAGNOSIS — M5440 Lumbago with sciatica, unspecified side: Secondary | ICD-10-CM

## 2017-12-16 MED ORDER — LISINOPRIL 5 MG PO TABS: 5.0000 mg | ORAL_TABLET | Freq: Every day | ORAL | 3 refills | Status: DC
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
## 2017-12-16 NOTE — Patient Instructions (Addendum)
Don't forget to schedule annual eye doctor appt   Potassium level is high  Limit bananas to one per day   Keep doing your PT  Keep using your walker  Labs are fairly stable   Follow up in 6 months

## 2017-12-18 NOTE — Assessment & Plan Note (Signed)
Lab Results  Component Value Date   PSA 0.62 12/12/2017   PSA 0.59 06/10/2017   PSA 0.55 12/10/2016   Pt denies symptoms

## 2017-12-18 NOTE — Assessment & Plan Note (Signed)
Ongoing in addn to organic brain inj in the past  Baseline today- a bit fatigued however  Declined further medication incl plavix  Declined any medication to slow memory loss  Rev neuro notes- declines f/u

## 2017-12-18 NOTE — Assessment & Plan Note (Signed)
Pt continues flexeril prn with caution of sedation and falls

## 2017-12-18 NOTE — Assessment & Plan Note (Signed)
bp in fair control at this time  BP Readings from Last 1 Encounters:  12/16/17 126/64   No changes needed Disc lifstyle change with low sodium diet and exercise  Labs reviewed

## 2017-12-18 NOTE — Assessment & Plan Note (Signed)
Lab Results  Component Value Date   HGBA1C 6.2 12/12/2017   Well controlled  Continues low glycemic diet /now supplementing with protein shakes in between meals as well

## 2017-12-18 NOTE — Assessment & Plan Note (Signed)
Doing well s/p surgery  Now at home from snf  Has walker to use at all times  Doing PT  Overall progressing well  Disc fall prev in detail  Continue f/u Dr Despina HickAlusio

## 2017-12-18 NOTE — Assessment & Plan Note (Signed)
Disc goals for lipids and reasons to control them Rev labs with pt Rev low sat fat diet in detail LDL is well controlled at 85 HDL low- hope this will imp with more ambulation /exercise

## 2018-01-09 ENCOUNTER — Other Ambulatory Visit: Payer: Self-pay | Admitting: Family Medicine

## 2018-01-09 NOTE — Telephone Encounter (Signed)
Will refill electronically  

## 2018-01-09 NOTE — Telephone Encounter (Signed)
Last time I can tell it was filled was on 06/14/17 #180 tabs with 0 refills, next OV is scheduled for 06/12/18, please advise

## 2018-01-16 ENCOUNTER — Other Ambulatory Visit: Payer: Self-pay | Admitting: Family Medicine

## 2018-01-16 NOTE — Telephone Encounter (Signed)
Will refill electronically  

## 2018-01-16 NOTE — Telephone Encounter (Signed)
See refill request 01/16/18.

## 2018-01-16 NOTE — Telephone Encounter (Signed)
Copied from CRM [id number]. Topic: Quick Communication - Rx Refill/Question >> Jan 16, 2018  4:41 PM Alexander BergeronBarksdale, Harvey B wrote: Medication: sildenafil (VIAGRA) 100 MG tablet [[id number]][[id number]]  Has the patient contacted their pharmacy? Yes.   (Agent: If no, request that the patient contact the pharmacy for the refill.) Preferred Pharmacy (with phone number or street name): [facility] [phone] Agent: Please be advised that RX refills may take up to 3 business days. We ask that you follow-up with your pharmacy.

## 2018-01-16 NOTE — Telephone Encounter (Signed)
viagra last refilled 10 x 3 on 08/23/17.pt seen 12/16/17. Request refill [facility]

## 2018-02-14 ENCOUNTER — Ambulatory Visit: Payer: Medicare Other | Admitting: Neurology

## 2018-02-23 DIAGNOSIS — S7291XD Unspecified fracture of right femur, subsequent encounter for closed fracture with routine healing: Secondary | ICD-10-CM | POA: Diagnosis not present

## 2018-02-23 DIAGNOSIS — Z4789 Encounter for other orthopedic aftercare: Secondary | ICD-10-CM | POA: Diagnosis not present

## 2018-03-02 ENCOUNTER — Other Ambulatory Visit: Payer: Self-pay | Admitting: Family Medicine

## 2018-06-05 ENCOUNTER — Telehealth: Payer: Self-pay | Admitting: Family Medicine

## 2018-06-05 DIAGNOSIS — E7800 Pure hypercholesterolemia, unspecified: Secondary | ICD-10-CM

## 2018-06-05 DIAGNOSIS — R739 Hyperglycemia, unspecified: Secondary | ICD-10-CM

## 2018-06-05 DIAGNOSIS — I10 Essential (primary) hypertension: Secondary | ICD-10-CM

## 2018-06-05 NOTE — Telephone Encounter (Signed)
-----   Message from Alvina Chouerri J Walsh sent at 05/30/2018  3:15 PM EDT ----- Regarding:  Lab orders for Wednesday, 6.12.19 Lab orders for a 6 month follow up appt

## 2018-06-07 ENCOUNTER — Other Ambulatory Visit (INDEPENDENT_AMBULATORY_CARE_PROVIDER_SITE_OTHER): Payer: Medicare Other

## 2018-06-07 DIAGNOSIS — I10 Essential (primary) hypertension: Secondary | ICD-10-CM | POA: Diagnosis not present

## 2018-06-07 DIAGNOSIS — R739 Hyperglycemia, unspecified: Secondary | ICD-10-CM

## 2018-06-07 DIAGNOSIS — E7800 Pure hypercholesterolemia, unspecified: Secondary | ICD-10-CM | POA: Diagnosis not present

## 2018-06-07 LAB — COMPREHENSIVE METABOLIC PANEL
ALT: 15 U/L (ref 0–53)
AST: 19 U/L (ref 0–37)
Albumin: 4.1 g/dL (ref 3.5–5.2)
Alkaline Phosphatase: 84 U/L (ref 39–117)
BUN: 16 mg/dL (ref 6–23)
CHLORIDE: 105 meq/L (ref 96–112)
CO2: 32 meq/L (ref 19–32)
CREATININE: 1.03 mg/dL (ref 0.40–1.50)
Calcium: 9.7 mg/dL (ref 8.4–10.5)
GFR: 74.55 mL/min (ref >60.00)
GLUCOSE: 103 mg/dL — AB (ref 70–99)
Potassium: 3.9 mEq/L (ref 3.5–5.1)
Sodium: 142 mEq/L (ref 135–145)
Total Bilirubin: 0.5 mg/dL (ref 0.2–1.2)
Total Protein: 7.2 g/dL (ref 6.0–8.3)

## 2018-06-07 LAB — LIPID PANEL
CHOL/HDL RATIO: 5
CHOLESTEROL: 160 mg/dL (ref 0–200)
HDL: 30.5 mg/dL — ABNORMAL LOW (ref >39.00)
LDL CALC: 107 mg/dL — AB (ref 0–99)
NONHDL: 129.62
Triglycerides: 111 mg/dL (ref 0.0–149.0)
VLDL: 22.2 mg/dL (ref 0.0–40.0)

## 2018-06-07 LAB — HEMOGLOBIN A1C: Hgb A1c MFr Bld: 5.9 % (ref 4.6–6.5)

## 2018-06-12 ENCOUNTER — Ambulatory Visit (INDEPENDENT_AMBULATORY_CARE_PROVIDER_SITE_OTHER): Payer: Medicare Other | Admitting: Family Medicine

## 2018-06-12 ENCOUNTER — Encounter: Payer: Self-pay | Admitting: Family Medicine

## 2018-06-12 VITALS — BP 118/74 | HR 70 | Temp 97.9°F | Ht 72.5 in | Wt 153.0 lb

## 2018-06-12 DIAGNOSIS — R739 Hyperglycemia, unspecified: Secondary | ICD-10-CM | POA: Diagnosis not present

## 2018-06-12 DIAGNOSIS — E7800 Pure hypercholesterolemia, unspecified: Secondary | ICD-10-CM

## 2018-06-12 DIAGNOSIS — I10 Essential (primary) hypertension: Secondary | ICD-10-CM

## 2018-06-12 DIAGNOSIS — R634 Abnormal weight loss: Secondary | ICD-10-CM | POA: Diagnosis not present

## 2018-06-12 DIAGNOSIS — R4189 Other symptoms and signs involving cognitive functions and awareness: Secondary | ICD-10-CM

## 2018-06-12 NOTE — Assessment & Plan Note (Signed)
11 lb since December  Decreased appetite since his hip fracture  Disc ways to inc calories with lean protein and bigger portions  Also snack (since he stays up very late)  Will continue to follow-wife is aware

## 2018-06-12 NOTE — Assessment & Plan Note (Signed)
Lab Results  Component Value Date   HGBA1C 5.9 06/07/2018   Very well controlled with diet  Needs more calories overall-disc low sugar protein shakes

## 2018-06-12 NOTE — Assessment & Plan Note (Signed)
Disc goals for lipids and reasons to control them Rev last labs with pt Rev low sat fat diet in detail  LDL is up slightly  Eating more eggs and meat  Disc limiting sat fats and choosing leaner ones  Do not want to cut calories due to wt loss

## 2018-06-12 NOTE — Patient Instructions (Addendum)
You are close to being underweight   Add one more meal or snack - even a protein shake or two  Start adding some back  Can cheat on diet a bit   Stay active but use walker to prevent falls   Reading and socializing help keep brain sharper (helps memory and cognition)   The pharmacy can let me know when you are out of viagra refills

## 2018-06-12 NOTE — Assessment & Plan Note (Signed)
This has gradually increased since his hip fracture/surgery  At baseline -problematic since head injury years ago Needs more help with ADLs Short term memory continues to decline as well  Confuses easily  Disc safety issues in detail

## 2018-06-12 NOTE — Progress Notes (Signed)
Subjective:    Patient ID: Robert Fitzgerald, male    DOB: 1941-03-06, 77 y.o.   MRN: [id number]  HPI Here for f/u of chronic medical problems   Has to use a walker when he is out  Can drive now  Refuses to use walker in the house- really does not need it  Hip is a little sore /better than it was  Thinks strength is ok (but needs help getting off the toilet)  Moving more in general   Balance is still not good   More memory problems since getting out of the nursing home after fracture  Difficulty writing now  Gets frustrated at times     Wt Readings from Last 3 Encounters:  06/12/18 153 lb (69.4 kg)  12/16/17 164 lb (74.4 kg)  12/12/17 159 lb 8 oz (72.3 kg)  lost 11 lb since December  Eating regularly but wife says he does not eat enough when he does  Tries to get in eggs/meat/fruit in the am  Beans/franks/veg or other meat  Sometimes 2 meals a day or snack before bed  Quit eating bread as a rule  Likes potatoes  20.47 kg/m  bp is stable today  No cp or palpitations or headaches or edema  No side effects to medicines  BP Readings from Last 3 Encounters:  06/12/18 118/74  12/16/17 126/64  12/12/17 118/78      Hyperlipidemia  Lab Results  Component Value Date   CHOL 160 06/07/2018   CHOL 146 12/12/2017   CHOL 177 06/14/2017   Lab Results  Component Value Date   HDL 30.50 (L) 06/07/2018   HDL 33.00 (L) 12/12/2017   HDL 31.10 (L) 06/14/2017   Lab Results  Component Value Date   LDLCALC 107 (H) 06/07/2018   LDLCALC 85 12/12/2017   LDLCALC 95 06/02/2016   Lab Results  Component Value Date   TRIG 111.0 06/07/2018   TRIG 142.0 12/12/2017   TRIG 290.0 (H) 06/14/2017   Lab Results  Component Value Date   CHOLHDL 5 06/07/2018   CHOLHDL 4 12/12/2017   CHOLHDL 6 06/14/2017   Lab Results  Component Value Date   LDLDIRECT 112.0 06/14/2017   LDLDIRECT 127.0 12/10/2016   LDLDIRECT 94.0 04/11/2015  diet controlled  Not a lot of fatty good  Does eat  eggs and meat every am  Does use [place] sausage   Hyperglycemia Lab Results  Component Value Date   HGBA1C 5.9 06/07/2018   Down from 6.2 Very well controlled   Uses flexeril prn for back    Patient Active Problem List   Diagnosis Date Noted  . Cognitive decline 06/12/2018  . Weight loss 06/12/2018  . H/O fracture of hip 09/16/2017  . Confusion 06/14/2017  . Short-term memory loss 06/14/2017  . History of recent fall 12/14/2016  . Need for hepatitis C screening test 12/10/2016  . Prostate cancer screening 12/09/2016  . Medicare annual wellness visit, initial 11/04/2015  . Hyperglycemia 08/11/2011  . Essential hypertension 02/01/2008  . Chronic back pain 02/01/2008  . HYPERCHOLESTEROLEMIA 01/31/2008  . ERECTILE DYSFUNCTION 01/31/2008  . TINNITUS, CHRONIC 01/31/2008  . HIATAL HERNIA WITH REFLUX 01/31/2008  . HEAD TRAUMA, CLOSED 01/31/2008   Past Medical History:  Diagnosis Date  . Chronic back pain   . 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 Diverticulitis 1992  . ED (erectile dysfunction)   . GERD (gastroesophageal reflux disease)   . Headache, post-traumatic, chronic   .  Hyperglycemia    Type 2, mild  . Hyperlipidemia    Diet controlled  . Hypertension   . Tinnitus    chronic   Past Surgical History:  Procedure Laterality Date  . BACK SURGERY     x 6- including fusion  . CIRCUMCISION    . FEMUR IM NAIL Right 09/16/2017   Procedure: INTRAMEDULLARY (IM) NAIL FEMORAL;  Surgeon: Ollen Gross, MD;  Location: [facility];  Service: Orthopedics;  Laterality: Right;  . NASAL SEPTUM SURGERY  1999  . SHOULDER SURGERY  1999   2011 for bone spur   Social History   Tobacco Use  . Smoking status: Never Smoker  . Smokeless tobacco: Never Used  Substance Use Topics  . Alcohol use: No    Alcohol/week: 0.0 oz  . Drug use: No   Family History  Problem Relation Age of Onset  . Aneurysm Brother        Brain   Allergies  Allergen Reactions  .  Aspirin     REACTION: GI upset  . Codeine Sulfate     REACTION: nausea  . Naproxen Sodium     REACTION: GI upset   Current Outpatient Medications on File Prior to Visit  Medication Sig Dispense Refill  . acetaminophen (TYLENOL) 325 MG tablet Take 2 tablets (650 mg total) by mouth every 6 (six) hours as needed for mild pain (or Fever >/= 101). 40 tablet 0  . cyclobenzaprine (FLEXERIL) 10 MG tablet TAKE 1 TABLET BY MOUTH TWICE DAILY AS NEEDED FOR  BACK  PAIN 180 tablet 0  . lisinopril (PRINIVIL,ZESTRIL) 5 MG tablet Take 1 tablet (5 mg total) by mouth daily. 90 tablet 3  . sildenafil (VIAGRA) 100 MG tablet TAKE 1 TABLET BY MOUTH EVERY DAY AS NEEDED FOR ERECTILE DYSFUNCTION 10 tablet 5   No current facility-administered medications on file prior to visit.     Review of Systems  Constitutional: Positive for activity change and appetite change. Negative for fatigue, fever and unexpected weight change.  HENT: Negative for congestion, rhinorrhea, sore throat and trouble swallowing.   Eyes: Negative for pain, redness, itching and visual disturbance.  Respiratory: Negative for cough, chest tightness, shortness of breath and wheezing.   Cardiovascular: Negative for chest pain and palpitations.  Gastrointestinal: Negative for abdominal pain, blood in stool, constipation, diarrhea and nausea.  Endocrine: Negative for cold intolerance, heat intolerance, polydipsia and polyuria.  Genitourinary: Negative for difficulty urinating, dysuria, frequency and urgency.  Musculoskeletal: Positive for arthralgias and back pain. Negative for joint swelling and myalgias.  Skin: Negative for pallor and rash.  Neurological: Negative for dizziness, tremors, syncope, facial asymmetry, speech difficulty, weakness, light-headedness, numbness and headaches.       Poor balance   Hematological: Negative for adenopathy. Does not bruise/bleed easily.  Psychiatric/Behavioral: Positive for confusion. Negative for decreased  concentration, dysphoric mood and sleep disturbance. The patient is not nervous/anxious.        Poor short term memory and confuses easily       Objective:   Physical Exam  Constitutional: He appears well-developed and well-nourished. No distress.  Slim and frail appearing (wt loss noted)   HENT:  Head: Normocephalic and atraumatic.  Mouth/Throat: Oropharynx is clear and moist.  Eyes: Pupils are equal, round, and reactive to light. Conjunctivae and EOM are normal. No scleral icterus.  Neck: Normal range of motion. Neck supple. No JVD present. Carotid bruit is not present. No thyromegaly present.  Cardiovascular: Normal rate, regular rhythm, normal heart  sounds and intact distal pulses. Exam reveals no gallop.  Pulmonary/Chest: Effort normal and breath sounds normal. No respiratory distress. He has no wheezes. He has no rales.  No crackles  Abdominal: Soft. Bowel sounds are normal. He exhibits no distension, no abdominal bruit and no mass. There is no tenderness.  Musculoskeletal: He exhibits no edema or tenderness.  Poor rom of LS   Lymphadenopathy:    He has no cervical adenopathy.  Neurological: He is alert. He has normal reflexes. He displays normal reflexes. No cranial nerve deficit. Coordination normal.  Can rise from chair with assistance and gait is steady with a walker   Skin: Skin is warm and dry. Capillary refill takes less than 2 seconds. No rash noted.  Fair complexion sks   Psychiatric: He has a normal mood and affect. Cognition and memory are impaired. He exhibits abnormal recent memory.  Pleasant  Quiet Confuses easily          Assessment & Plan:   Problem List Items Addressed This Visit      Cardiovascular and Mediastinum   Essential hypertension - Primary    bp in fair control at this time  BP Readings from Last 1 Encounters:  06/12/18 118/74   No changes needed Most recent labs reviewed  Disc lifstyle change with low sodium diet and exercise            Other   Cognitive decline    This has gradually increased since his hip fracture/surgery  At baseline -problematic since head injury years ago Needs more help with ADLs Short term memory continues to decline as well  Confuses easily  Disc safety issues in detail       HYPERCHOLESTEROLEMIA    Disc goals for lipids and reasons to control them Rev last labs with pt Rev low sat fat diet in detail  LDL is up slightly  Eating more eggs and meat  Disc limiting sat fats and choosing leaner ones  Do not want to cut calories due to wt loss       Hyperglycemia    Lab Results  Component Value Date   HGBA1C 5.9 06/07/2018   Very well controlled with diet  Needs more calories overall-disc low sugar protein shakes       Weight loss    11 lb since December  Decreased appetite since his hip fracture  Disc ways to inc calories with lean protein and bigger portions  Also snack (since he stays up very late)  Will continue to follow-wife is aware

## 2018-06-12 NOTE — Assessment & Plan Note (Signed)
bp in fair control at this time  BP Readings from Last 1 Encounters:  06/12/18 118/74   No changes needed Most recent labs reviewed  Disc lifstyle change with low sodium diet and exercise

## 2018-06-23 ENCOUNTER — Other Ambulatory Visit: Payer: Self-pay | Admitting: Family Medicine

## 2018-08-11 ENCOUNTER — Other Ambulatory Visit: Payer: Self-pay | Admitting: Family Medicine

## 2018-08-11 NOTE — Telephone Encounter (Signed)
Last Rx 01/09/18 #180. Last OV 06/12/2018. pls advise

## 2018-09-14 ENCOUNTER — Other Ambulatory Visit: Payer: Self-pay | Admitting: Family Medicine

## 2018-12-04 ENCOUNTER — Other Ambulatory Visit: Payer: Self-pay | Admitting: Family Medicine

## 2018-12-13 ENCOUNTER — Telehealth: Payer: Self-pay | Admitting: Family Medicine

## 2018-12-13 DIAGNOSIS — Z125 Encounter for screening for malignant neoplasm of prostate: Secondary | ICD-10-CM

## 2018-12-13 DIAGNOSIS — R7303 Prediabetes: Secondary | ICD-10-CM

## 2018-12-13 DIAGNOSIS — I10 Essential (primary) hypertension: Secondary | ICD-10-CM

## 2018-12-13 DIAGNOSIS — E7800 Pure hypercholesterolemia, unspecified: Secondary | ICD-10-CM

## 2018-12-13 NOTE — Telephone Encounter (Signed)
-----   Message from Robert Bellowarlesia R Pinson, LPN sent at 95/[id number]/130812/13/2019  4:18 PM EST ----- Regarding: Labs 12/20 Lab orders needed. Thank you.  Insurance:  [employer]

## 2018-12-15 ENCOUNTER — Other Ambulatory Visit (INDEPENDENT_AMBULATORY_CARE_PROVIDER_SITE_OTHER): Payer: Medicare Other

## 2018-12-15 ENCOUNTER — Ambulatory Visit: Payer: Medicare Other

## 2018-12-15 DIAGNOSIS — R7303 Prediabetes: Secondary | ICD-10-CM | POA: Diagnosis not present

## 2018-12-15 DIAGNOSIS — Z125 Encounter for screening for malignant neoplasm of prostate: Secondary | ICD-10-CM | POA: Diagnosis not present

## 2018-12-15 DIAGNOSIS — I10 Essential (primary) hypertension: Secondary | ICD-10-CM | POA: Diagnosis not present

## 2018-12-15 DIAGNOSIS — E7800 Pure hypercholesterolemia, unspecified: Secondary | ICD-10-CM

## 2018-12-15 LAB — CBC WITH DIFFERENTIAL/PLATELET
Basophils Absolute: 0.1 10*3/uL (ref 0.0–0.1)
Basophils Relative: 1.3 % (ref 0.0–3.0)
Eosinophils Absolute: 0.4 10*3/uL (ref 0.0–0.7)
Eosinophils Relative: 5 % (ref 0.0–5.0)
HCT: 42.2 % (ref 39.0–52.0)
Hemoglobin: 14 g/dL (ref 13.0–17.0)
Lymphocytes Relative: 44.2 % (ref 12.0–46.0)
Lymphs Abs: 3.5 10*3/uL (ref 0.7–4.0)
MCHC: 33.3 g/dL (ref 30.0–36.0)
MCV: 93.2 fl (ref 78.0–100.0)
Monocytes Absolute: 0.8 10*3/uL (ref 0.1–1.0)
Monocytes Relative: 10.7 % (ref 3.0–12.0)
NEUTROS ABS: 3 10*3/uL (ref 1.4–7.7)
Neutrophils Relative %: 38.8 % — ABNORMAL LOW (ref 43.0–77.0)
Platelets: 250 10*3/uL (ref 150.0–400.0)
RBC: 4.52 Mil/uL (ref 4.22–5.81)
RDW: 13.9 % (ref 11.5–15.5)
WBC: 7.8 10*3/uL (ref 4.0–10.5)

## 2018-12-15 LAB — COMPREHENSIVE METABOLIC PANEL
ALT: 9 U/L (ref 0–53)
AST: 13 U/L (ref 0–37)
Albumin: 4.2 g/dL (ref 3.5–5.2)
Alkaline Phosphatase: 93 U/L (ref 39–117)
BUN: 16 mg/dL (ref 6–23)
CO2: 31 meq/L (ref 19–32)
Calcium: 9.6 mg/dL (ref 8.4–10.5)
Chloride: 106 mEq/L (ref 96–112)
Creatinine, Ser: 1.17 mg/dL (ref 0.40–1.50)
GFR: 64.27 mL/min (ref >60.00)
Glucose, Bld: 104 mg/dL — ABNORMAL HIGH (ref 70–99)
Potassium: 4.1 mEq/L (ref 3.5–5.1)
Sodium: 143 mEq/L (ref 135–145)
Total Bilirubin: 0.5 mg/dL (ref 0.2–1.2)
Total Protein: 7 g/dL (ref 6.0–8.3)

## 2018-12-15 LAB — LIPID PANEL
Cholesterol: 164 mg/dL (ref 0–200)
HDL: 30 mg/dL — ABNORMAL LOW (ref >39.00)
LDL Cholesterol: 108 mg/dL — ABNORMAL HIGH (ref 0–99)
NONHDL: 133.53
Total CHOL/HDL Ratio: 5
Triglycerides: 127 mg/dL (ref 0.0–149.0)
VLDL: 25.4 mg/dL (ref 0.0–40.0)

## 2018-12-15 LAB — HEMOGLOBIN A1C: Hgb A1c MFr Bld: 6 % (ref 4.6–6.5)

## 2018-12-15 LAB — PSA, MEDICARE: PSA: 0.76 ng/ml (ref 0.10–4.00)

## 2018-12-15 LAB — TSH: TSH: 4.18 u[IU]/mL (ref 0.35–4.50)

## 2018-12-22 ENCOUNTER — Ambulatory Visit (INDEPENDENT_AMBULATORY_CARE_PROVIDER_SITE_OTHER): Payer: Medicare Other

## 2018-12-22 ENCOUNTER — Ambulatory Visit (INDEPENDENT_AMBULATORY_CARE_PROVIDER_SITE_OTHER): Payer: Medicare Other | Admitting: Family Medicine

## 2018-12-22 VITALS — BP 124/80 | HR 64 | Temp 98.4°F | Ht 72.5 in | Wt 163.2 lb

## 2018-12-22 DIAGNOSIS — Z125 Encounter for screening for malignant neoplasm of prostate: Secondary | ICD-10-CM | POA: Diagnosis not present

## 2018-12-22 DIAGNOSIS — R7303 Prediabetes: Secondary | ICD-10-CM | POA: Diagnosis not present

## 2018-12-22 DIAGNOSIS — E7800 Pure hypercholesterolemia, unspecified: Secondary | ICD-10-CM

## 2018-12-22 DIAGNOSIS — I10 Essential (primary) hypertension: Secondary | ICD-10-CM

## 2018-12-22 DIAGNOSIS — R413 Other amnesia: Secondary | ICD-10-CM | POA: Diagnosis not present

## 2018-12-22 DIAGNOSIS — R4189 Other symptoms and signs involving cognitive functions and awareness: Secondary | ICD-10-CM | POA: Diagnosis not present

## 2018-12-22 DIAGNOSIS — G8929 Other chronic pain: Secondary | ICD-10-CM | POA: Diagnosis not present

## 2018-12-22 DIAGNOSIS — M5440 Lumbago with sciatica, unspecified side: Secondary | ICD-10-CM | POA: Diagnosis not present

## 2018-12-22 DIAGNOSIS — Z23 Encounter for immunization: Secondary | ICD-10-CM | POA: Diagnosis not present

## 2018-12-22 DIAGNOSIS — Z0000 Encounter for general adult medical examination without abnormal findings: Secondary | ICD-10-CM

## 2018-12-22 DIAGNOSIS — Z9181 History of falling: Secondary | ICD-10-CM

## 2018-12-22 MED ORDER — CYCLOBENZAPRINE HCL 10 MG PO TABS: ORAL_TABLET | ORAL | 1 refills | Status: AC

## 2018-12-22 MED ORDER — LISINOPRIL 5 MG PO TABS: 5.0000 mg | ORAL_TABLET | Freq: Every day | ORAL | 3 refills | Status: DC

## 2018-12-22 NOTE — Progress Notes (Signed)
Subjective:   Robert LangtonHerman Dean Fitzgerald is a 77 y.o. male who presents for Medicare Annual/Subsequent preventive examination.  Review of Systems:  N/A Cardiac Risk Factors include: advanced age (>6155men, 8>65 women);male gender;dyslipidemia;hypertension     Objective:    Vitals: BP 124/80 (BP Location: Right Arm, Patient Position: Sitting, Cuff Size: Normal)   Pulse 64   Temp 98.4 F (36.9 C) (Oral)   Ht 6' 0.5" (1.842 m) Comment: shoes  Wt 163 lb 4 oz (74 kg)   SpO2 97%   BMI 21.84 kg/m   Body mass index is 21.84 kg/m.  Advanced Directives 12/22/2018 12/12/2017 09/16/2017 12/10/2016  Does Patient Have a Medical Advance Directive? No No No No  Would patient like information on creating a medical advance directive? No - Patient declined No - Patient declined No - Patient declined -    Tobacco Social History   Tobacco Use  Smoking Status Never Smoker  Smokeless Tobacco Never Used     Counseling given: No   Clinical Intake:  Pre-visit preparation completed: Yes  Pain Score: 9      Nutritional Status: BMI of 19-24  Normal Nutritional Risks: None Diabetes: No  How often do you need to have someone help you when you read instructions, pamphlets, or other written materials from your doctor or pharmacy?: 3 - Sometimes What is the last grade level you completed in school?: 12th grade  Interpreter Needed?: No  Comments: pt lives with spouse Information entered by :: LPinson, LPN  Past Medical History:  Diagnosis Date  . Chronic back pain   . 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 Diverticulitis 1992  . ED (erectile dysfunction)   . GERD (gastroesophageal reflux disease)   . Headache, post-traumatic, chronic   . Hyperglycemia    Type 2, mild  . Hyperlipidemia    Diet controlled  . Hypertension   . Tinnitus    chronic   Past Surgical History:  Procedure Laterality Date  . BACK SURGERY     x 6- including fusion  . CIRCUMCISION    . FEMUR IM  NAIL Right 09/16/2017   Procedure: INTRAMEDULLARY (IM) NAIL FEMORAL;  Surgeon: Ollen GrossAluisio, Frank, MD;  Location: [facility];  Service: Orthopedics;  Laterality: Right;  . NASAL SEPTUM SURGERY  1999  . SHOULDER SURGERY  1999   2011 for bone spur   Family History  Problem Relation Age of Onset  . Aneurysm Brother        Brain   Social History   Socioeconomic History  . Marital status: Married    Spouse name: Olegario MessierKathy  . Number of children: 1  . Years of education: 5312  . Highest education level: Not on file  Occupational History  . Occupation: Retired  Engineer, productionocial Needs  . Financial resource strain: Not on file  . Food insecurity:    Worry: Not on file    Inability: Not on file  . Transportation needs:    Medical: Not on file    Non-medical: Not on file  Tobacco Use  . Smoking status: Never Smoker  . Smokeless tobacco: Never Used  Substance and Sexual Activity  . Alcohol use: No    Alcohol/week: 0.0 standard drinks  . Drug use: No  . Sexual activity: Yes  Lifestyle  . Physical activity:    Days per week: Not on file    Minutes per session: Not on file  . Stress: Not on file  Relationships  .  Social connections:    Talks on phone: Not on file    Gets together: Not on file    Attends religious service: Not on file    Active member of club or organization: Not on file    Attends meetings of clubs or organizations: Not on file    Relationship status: Not on file  Other Topics Concern  . Not on file  Social History Narrative   Lives at home with wife.    Caffeine use: Coffee or tea daily   Diet-soda   Left handed    Outpatient Encounter Medications as of 12/22/2018  Medication Sig  . acetaminophen (TYLENOL) 325 MG tablet Take 2 tablets (650 mg total) by mouth every 6 (six) hours as needed for mild pain (or Fever >/= 101).  . sildenafil (VIAGRA) 100 MG tablet TAKE 1 TABLET BY MOUTH EVERY DAY AS NEEDED FOR ERECTILE DYSFUNCTION  . [DISCONTINUED] cyclobenzaprine (FLEXERIL) 10 MG  tablet TAKE 1 TABLET BY MOUTH TWICE DAILY AS NEEDED FOR  BACK  PAIN  . [DISCONTINUED] lisinopril (PRINIVIL,ZESTRIL) 5 MG tablet Take 1 tablet (5 mg total) by mouth daily.   No facility-administered encounter medications on file as of 12/22/2018.     Activities of Daily Living In your present state of health, do you have any difficulty performing the following activities: 12/22/2018  Hearing? N  Vision? N  Difficulty concentrating or making decisions? Y  Walking or climbing stairs? Y  Dressing or bathing? N  Doing errands, shopping? N  Preparing Food and eating ? N  Using the Toilet? N  In the past six months, have you accidently leaked urine? N  Do you have problems with loss of bowel control? N  Managing your Medications? Y  Managing your Finances? Y  Housekeeping or managing your Housekeeping? Y  Some recent data might be hidden    Patient Care Team: Tower, Audrie Gallus, MD as PCP - Veto Kemps, MD as Consulting Physician (Ophthalmology)   Assessment:   This is a routine wellness examination for Robert Fitzgerald.   Hearing Screening   125Hz  250Hz  500Hz  1000Hz  2000Hz  3000Hz  4000Hz  6000Hz  8000Hz   Right ear:   0 40 40  0    Left ear:   40 40 40  0    Vision Screening Comments: Patient does not have corrective lenses - unable to complete vision screen   Exercise Activities and Dietary recommendations Current Exercise Habits: The patient does not participate in regular exercise at present, Exercise limited by: orthopedic condition(s)  Goals    . Follow up with Primary Care Provider     Starting 12/22/2018, I will continue to take medications as prescribed and follow up with PCP and other specialists as scheduled.        Fall Risk Fall Risk  12/22/2018 12/12/2017 12/10/2016 11/04/2015 02/13/2013  Falls in the past year? 1 Yes No No Yes  Comment per spouse, patient has fallen 10-11 times since Dec 2018 due to loss of balance Sept 2018; fall with injury and subsequent surgery  - - -  Number falls in past yr: 1 1 - - 2 or more  Injury with Fall? 1 Yes - - -  Risk for fall due to : History of fall(s);Impaired balance/gait;Impaired mobility Impaired balance/gait;Impaired vision;Impaired mobility;Mental status change - - Mental status change  Follow up - Falls prevention discussed - - -   Depression Screen PHQ 2/9 Scores 12/22/2018 12/12/2017 12/10/2016 11/04/2015  PHQ - 2 Score 0 0 0 0  PHQ- 9 Score 0 0 - -    Cognitive Function MMSE - Mini Mental State Exam 12/22/2018 12/12/2017 08/09/2017 12/10/2016  Orientation to time 5 5 3 3   Orientation to time comments needed cues - - pt was not able to provide correct year; stated it 2002  Orientation to Place 5 5 4 5   Orientation to Place-comments needed cues - - -  Registration 3 3 3 3   Attention/ Calculation 0 0 2 0  Recall 1 0 1 0  Recall-comments unable to recall 2 of 3 words unable to recall 3 of 3 words - pt was unable to recall 3 of 3 words  Language- name 2 objects 0 0 2 0  Language- repeat 1 1 1 1   Language- follow 3 step command 0 1 3 0  Language- follow 3 step command-comments unable to follow 3 steps of 3 step command unable to follow 2 steps of 3 step command - pt was unable to follow 3 step command despite multiple cues provided by nurse and spouse  Language- read & follow direction 0 0 1 0  Write a sentence 0 0 1 0  Copy design 0 0 0 0  Total score 15 15 21 12      PLEASE NOTE: A Mini-Cog screen was completed. Maximum score is 20. A value of 0 denotes this part of Folstein MMSE was not completed or the patient failed this part of the Mini-Cog screening.   Mini-Cog Screening Orientation to Time - Max 5 pts Orientation to Place - Max 5 pts Registration - Max 3 pts Recall - Max 3 pts Language Repeat - Max 1 pts Language Follow 3 Step Command - Max 3 pts     Immunization History  Administered Date(s) Administered  . H1N1 01/17/2009  . Influenza Split 10/19/2011, 09/18/2014  . Influenza Whole  10/08/2008, 09/10/2009, 09/26/2010  . Influenza, High Dose Seasonal PF 12/22/2018  . Influenza,inj,Quad PF,6+ Mos 11/04/2015  . Influenza-Unspecified 08/27/2016, 08/27/2017  . Pneumococcal Conjugate-13 01/10/2015  . Pneumococcal Polysaccharide-23 12/27/1996, 10/08/2008  . Td 02/13/2013   Screening Tests Health Maintenance  Topic Date Due  . OPHTHALMOLOGY EXAM  12/26/2018 (Originally 07/27/2017)  . HEMOGLOBIN A1C  06/16/2019  . FOOT EXAM  12/23/2019  . TETANUS/TDAP  02/13/2023  . INFLUENZA VACCINE  Completed  . PNA vac Low Risk Adult  Completed       Plan:      I have personally reviewed, addressed, and noted the following in the patient's chart:  A. Medical and social history B. Use of alcohol, tobacco or illicit drugs  C. Current medications and supplements D. Functional ability and status E.  Nutritional status F.  Physical activity G. Advance directives H. List of other physicians I.  Hospitalizations, surgeries, and ER visits in previous 12 months J.  Vitals K. Screenings to include hearing, vision, cognitive, depression L. Referrals and appointments - none  In addition, I have reviewed and discussed with patient certain preventive protocols, quality metrics, and best practice recommendations. A written personalized care plan for preventive services as well as general preventive health recommendations were provided to patient.  See attached scanned questionnaire for additional information.   Signed,   Randa EvensLesia , MHA, BS, LPN Health Coach

## 2018-12-22 NOTE — Patient Instructions (Addendum)
We need to assure every time you get up at night you use the walker  An alarm system or rail on bed may be helpful - to wake up your wife so she can be aware to make sure walker is used   Don't forget to get your eye exam please   If you are interested in the new shingles vaccine (Shingrix) - call your local pharmacy to check on coverage and availability  If affordable, get on a wait list at your pharmacy to get the vaccine.    for cholesterol  Avoid red meat/ fried foods/ egg yolks/ fatty breakfast meats/ butter, cheese and high fat dairy/ and shellfish    For prediabetes - do not make a habit of sweets

## 2018-12-22 NOTE — Patient Instructions (Addendum)
Robert Fitzgerald , Thank you for taking time to come for your Medicare Wellness Visit. I appreciate your ongoing commitment to your health goals. Please review the following plan we discussed and let me know if I can assist you in the future.   These are the goals we discussed: Goals    . Follow up with Primary Care Provider     Starting 12/22/2018, I will continue to take medications as prescribed and follow up with PCP and other specialists as scheduled.                 This is a list of the screening recommended for you and due dates:  Health Maintenance  Topic Date Due  . Eye exam for diabetics  12/26/2018*  . Hemoglobin A1C  06/16/2019  . Complete foot exam   12/23/2019  . Tetanus Vaccine  02/13/2023  . Flu Shot  Completed  . Pneumonia vaccines  Completed  *Topic was postponed. The date shown is not the original due date.   Preventive Care for Adults  A healthy lifestyle and preventive care can promote health and wellness. Preventive health guidelines for adults include the following key practices.  . A routine yearly physical is a good way to check with your health care provider about your health and preventive screening. It is a chance to share any concerns and updates on your health and to receive a thorough exam.  . Visit your dentist for a routine exam and preventive care every 6 months. Brush your teeth twice a day and floss once a day. Good oral hygiene prevents tooth decay and gum disease.  . The frequency of eye exams is based on your age, health, family medical history, use  of contact lenses, and other factors. Follow your health care provider's recommendations for frequency of eye exams.  . Eat a healthy diet. Foods like vegetables, fruits, whole grains, low-fat dairy products, and lean protein foods contain the nutrients you need without too many calories. Decrease your intake of foods high in solid fats, added sugars, and salt. Eat the right amount of calories for  you. Get information about a proper diet from your health care provider, if necessary.  . Regular physical exercise is one of the most important things you can do for your health. Most adults should get at least 150 minutes of moderate-intensity exercise (any activity that increases your heart rate and causes you to sweat) each week. In addition, most adults need muscle-strengthening exercises on 2 or more days a week.  Silver Sneakers may be a benefit available to you. To determine eligibility, you may visit the website: www.silversneakers.com or contact program at 431-083-20201-([phone] Mon-Fri between 8AM-8PM.   . Maintain a healthy weight. The body mass index (BMI) is a screening tool to identify possible weight problems. It provides an estimate of body fat based on height and weight. Your health care provider can find your BMI and can help you achieve or maintain a healthy weight.   For adults 20 years and older: ? A BMI below 18.5 is considered underweight. ? A BMI of 18.5 to 24.9 is normal. ? A BMI of 25 to 29.9 is considered overweight. ? A BMI of 30 and above is considered obese.   . Maintain normal blood lipids and cholesterol levels by exercising and minimizing your intake of saturated fat. Eat a balanced diet with plenty of fruit and vegetables. Blood tests for lipids and cholesterol should begin at age 76 and be  repeated every 5 years. If your lipid or cholesterol levels are high, you are over 50, or you are at high risk for heart disease, you may need your cholesterol levels checked more frequently. Ongoing high lipid and cholesterol levels should be treated with medicines if diet and exercise are not working.  . If you smoke, find out from your health care provider how to quit. If you do not use tobacco, please do not start.  . If you choose to drink alcohol, please do not consume more than 2 drinks per day. One drink is considered to be 12 ounces (355 mL) of beer, 5 ounces (148 mL) of  wine, or 1.5 ounces (44 mL) of liquor.  . If you are 39-37 years old, ask your health care provider if you should take aspirin to prevent strokes.  . Use sunscreen. Apply sunscreen liberally and repeatedly throughout the day. You should seek shade when your shadow is shorter than you. Protect yourself by wearing long sleeves, pants, a wide-brimmed hat, and sunglasses year round, whenever you are outdoors.  . Once a month, do a whole body skin exam, using a mirror to look at the skin on your back. Tell your health care provider of new moles, moles that have irregular borders, moles that are larger than a pencil eraser, or moles that have changed in shape or color.

## 2018-12-22 NOTE — Progress Notes (Signed)
PCP notes:   Health maintenance:  Eye exam - addressed Foot exam - PCP follow-up requested Flu vaccine - administered  Abnormal screenings:   Hearing - failed  Hearing Screening   125Hz  250Hz  500Hz  1000Hz  2000Hz  3000Hz  4000Hz  6000Hz  8000Hz   Right ear:   0 40 40  0    Left ear:   40 40 40  0     Mini-Cog score: 15/20 MMSE - Mini Mental State Exam 12/22/2018 12/12/2017 08/09/2017 12/10/2016  Orientation to time 5 5 3 3   Orientation to time comments needed cues - - pt was not able to provide correct year; stated it 2002  Orientation to Place 5 5 4 5   Orientation to Place-comments needed cues - - -  Registration 3 3 3 3   Attention/ Calculation 0 0 2 0  Recall 1 0 1 0  Recall-comments unable to recall 2 of 3 words unable to recall 3 of 3 words - pt was unable to recall 3 of 3 words  Language- name 2 objects 0 0 2 0  Language- repeat 1 1 1 1   Language- follow 3 step command 0 1 3 0  Language- follow 3 step command-comments unable to follow 3 steps of 3 step command unable to follow 2 steps of 3 step command - pt was unable to follow 3 step command despite multiple cues provided by nurse and spouse  Language- read & follow direction 0 0 1 0  Write a sentence 0 0 1 0  Copy design 0 0 0 0  Total score 15 15 21 12     Fall risk - hx of multiple falls Fall Risk  12/22/2018 12/12/2017 12/10/2016 11/04/2015 02/13/2013  Falls in the past year? 1 Yes No No Yes  Comment per spouse, patient has fallen 10-11 times since Dec 2018 due to loss of balance Sept 2018; fall with injury and subsequent surgery - - -  Number falls in past yr: 1 1 - - 2 or more  Injury with Fall? 1 Yes - - -  Risk for fall due to : History of fall(s);Impaired balance/gait;Impaired mobility Impaired balance/gait;Impaired vision;Impaired mobility;Mental status change - - Mental status change  Follow up - Falls prevention discussed - - -   Patient concerns:   None  Nurse concerns:  None  Next PCP appt:    12/22/2018 @ 1515  I reviewed health advisor's note, was available for consultation, and agree with documentation and plan. Roxy MannsMarne Tower MD

## 2018-12-22 NOTE — Progress Notes (Signed)
Subjective:    Patient ID: Robert Fitzgerald, male    DOB: 24-Feb-1941, 77 y.o.   MRN: [id number]  HPI Here for annual f/u of chronic medical problems   Back and leg problem continue to worsen   Refuses foot exam today  Shoes are too hard to get off Wife says his feet are ok    Had amw today  No gaps identified  More falls - he falls when going to the  High dose flu shot given today  Cognitive issues noted  Working on adv directives with attorney Forgot glasses-could not do vision screen    Some hearing loss  He does not think he needs a hearing aide  Wife thinks his hearing is pretty good   Wt Readings from Last 3 Encounters:  12/22/18 163 lb 4 oz (74 kg)  12/22/18 163 lb 4 oz (74 kg)  06/12/18 153 lb (69.4 kg)  did gain some weight back this year -good appetite  21.84 kg/m   Eye exam -could not do /forgot glasses  Due for eye exam at eye office-will schedule   Prostate health Lab Results  Component Value Date   PSA 0.76 12/15/2018   PSA 0.62 12/12/2017   PSA 0.59 06/10/2017  no change in urinary status  Nocturia - 2 times per night    Zoster status - has not been vaccinated  Will check on coverage again   bp in fair control at this time  BP Readings from Last 1 Encounters:  12/22/18 124/80   No changes needed Most recent labs reviewed  Disc lifstyle change with low sodium diet and exercise   Hyperlipidemia Lab Results  Component Value Date   CHOL 164 12/15/2018   CHOL 160 06/07/2018   CHOL 146 12/12/2017   Lab Results  Component Value Date   HDL 30.00 (L) 12/15/2018   HDL 30.50 (L) 06/07/2018   HDL 33.00 (L) 12/12/2017   Lab Results  Component Value Date   LDLCALC 108 (H) 12/15/2018   LDLCALC 107 (H) 06/07/2018   LDLCALC 85 12/12/2017   Lab Results  Component Value Date   TRIG 127.0 12/15/2018   TRIG 111.0 06/07/2018   TRIG 142.0 12/12/2017   Lab Results  Component Value Date   CHOLHDL 5 12/15/2018   CHOLHDL 5 06/07/2018   CHOLHDL 4 12/12/2017   Lab Results  Component Value Date   LDLDIRECT 112.0 06/14/2017   LDLDIRECT 127.0 12/10/2016   LDLDIRECT 94.0 04/11/2015  not on medication  Eating healthy (not during the holidays)  Eats red meat only when they go out  Some fried chicken    Prediabetes Lab Results  Component Value Date   HGBA1C 6.0 12/15/2018  up from 5.9 Still eats some cookies   Other labs stable Lab Results  Component Value Date   CREATININE 1.17 12/15/2018   BUN 16 12/15/2018   NA 143 12/15/2018   K 4.1 12/15/2018   CL 106 12/15/2018   CO2 31 12/15/2018   Lab Results  Component Value Date   ALT 9 12/15/2018   AST 13 12/15/2018   ALKPHOS 93 12/15/2018   BILITOT 0.5 12/15/2018   Lab Results  Component Value Date   WBC 7.8 12/15/2018   HGB 14.0 12/15/2018   HCT 42.2 12/15/2018   MCV 93.2 12/15/2018   PLT 250.0 12/15/2018   Lab Results  Component Value Date   TSH 4.18 12/15/2018     Patient Active Problem List   Diagnosis Date  Noted  . Cognitive decline 06/12/2018  . Weight loss 06/12/2018  . H/O fracture of hip 09/16/2017  . Confusion 06/14/2017  . Short-term memory loss 06/14/2017  . History of recent fall 12/14/2016  . Need for hepatitis C screening test 12/10/2016  . Prostate cancer screening 12/09/2016  . Medicare annual wellness visit, initial 11/04/2015  . Prediabetes 08/11/2011  . Essential hypertension 02/01/2008  . Chronic back pain 02/01/2008  . HYPERCHOLESTEROLEMIA 01/31/2008  . ERECTILE DYSFUNCTION 01/31/2008  . TINNITUS, CHRONIC 01/31/2008  . HIATAL HERNIA WITH REFLUX 01/31/2008  . HEAD TRAUMA, CLOSED 01/31/2008   Past Medical History:  Diagnosis Date  . Chronic back pain   . 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 Diverticulitis 1992  . ED (erectile dysfunction)   . GERD (gastroesophageal reflux disease)   . Headache, post-traumatic, chronic   . Hyperglycemia    Type 2, mild  . Hyperlipidemia    Diet controlled  .  Hypertension   . Tinnitus    chronic   Past Surgical History:  Procedure Laterality Date  . BACK SURGERY     x 6- including fusion  . CIRCUMCISION    . FEMUR IM NAIL Right 09/16/2017   Procedure: INTRAMEDULLARY (IM) NAIL FEMORAL;  Surgeon: Ollen Gross, MD;  Location: [facility];  Service: Orthopedics;  Laterality: Right;  . NASAL SEPTUM SURGERY  1999  . SHOULDER SURGERY  1999   2011 for bone spur   Social History   Tobacco Use  . Smoking status: Never Smoker  . Smokeless tobacco: Never Used  Substance Use Topics  . Alcohol use: No    Alcohol/week: 0.0 standard drinks  . Drug use: No   Family History  Problem Relation Age of Onset  . Aneurysm Brother        Brain   Allergies  Allergen Reactions  . Aspirin     REACTION: GI upset  . Codeine Sulfate     REACTION: nausea  . Naproxen Sodium     REACTION: GI upset   Current Outpatient Medications on File Prior to Visit  Medication Sig Dispense Refill  . acetaminophen (TYLENOL) 325 MG tablet Take 2 tablets (650 mg total) by mouth every 6 (six) hours as needed for mild pain (or Fever >/= 101). 40 tablet 0  . sildenafil (VIAGRA) 100 MG tablet TAKE 1 TABLET BY MOUTH EVERY DAY AS NEEDED FOR ERECTILE DYSFUNCTION 10 tablet 5   No current facility-administered medications on file prior to visit.     Review of Systems  Constitutional: Negative for activity change, appetite change, fatigue, fever and unexpected weight change.  HENT: Negative for congestion, rhinorrhea, sore throat and trouble swallowing.   Eyes: Negative for pain, redness, itching and visual disturbance.  Respiratory: Negative for cough, chest tightness, shortness of breath and wheezing.   Cardiovascular: Negative for chest pain and palpitations.  Gastrointestinal: Negative for abdominal pain, blood in stool, constipation, diarrhea and nausea.  Endocrine: Negative for cold intolerance, heat intolerance, polydipsia and polyuria.  Genitourinary: Positive for  frequency. Negative for difficulty urinating, dysuria and urgency.       Nocturia  Musculoskeletal: Positive for arthralgias, back pain and gait problem. Negative for joint swelling and myalgias.  Skin: Negative for pallor and rash.  Neurological: Negative for dizziness, tremors, weakness, numbness and headaches.  Hematological: Negative for adenopathy. Does not bruise/bleed easily.  Psychiatric/Behavioral: Positive for confusion and decreased concentration. Negative for dysphoric mood, hallucinations and self-injury. The patient is  not nervous/anxious.        Baseline slowed cognition and short term memory loss       Objective:   Physical Exam Constitutional:      General: He is not in acute distress.    Appearance: Normal appearance. He is well-developed and normal weight.     Comments: Frail appearing elderly male  Walks with walker  HENT:     Head: Normocephalic and atraumatic.     Right Ear: Tympanic membrane, ear canal and external ear normal.     Left Ear: Tympanic membrane, ear canal and external ear normal.     Nose: Nose normal.     Mouth/Throat:     Mouth: Mucous membranes are moist.  Eyes:     General: No scleral icterus.       Right eye: No discharge.        Left eye: No discharge.     Conjunctiva/sclera: Conjunctivae normal.     Pupils: Pupils are equal, round, and reactive to light.  Neck:     Musculoskeletal: Normal range of motion and neck supple.     Thyroid: No thyromegaly.     Vascular: No carotid bruit or JVD.  Cardiovascular:     Rate and Rhythm: Normal rate and regular rhythm.     Heart sounds: Normal heart sounds. No gallop.   Pulmonary:     Effort: Pulmonary effort is normal. No respiratory distress.     Breath sounds: Normal breath sounds. No wheezing.  Chest:     Chest wall: No tenderness.  Abdominal:     General: Bowel sounds are normal. There is no distension or abdominal bruit.     Palpations: Abdomen is soft. There is no mass.      Tenderness: There is no abdominal tenderness.  Musculoskeletal:        General: No tenderness.     Right lower leg: No edema.     Left lower leg: No edema.     Comments: Refuses foot exam today   Lymphadenopathy:     Cervical: No cervical adenopathy.  Skin:    General: Skin is warm and dry.     Capillary Refill: Capillary refill takes less than 2 seconds.     Coloration: Skin is not pale.     Findings: No erythema, lesion or rash.  Neurological:     Mental Status: He is alert. Mental status is at baseline.     Cranial Nerves: No cranial nerve deficit.     Motor: No abnormal muscle tone.     Coordination: Coordination normal.     Deep Tendon Reflexes: Reflexes are normal and symmetric. Reflexes normal.  Psychiatric:        Attention and Perception: He does not perceive auditory or visual hallucinations.        Mood and Affect: Mood normal.        Speech: Speech is tangential.        Cognition and Memory: Cognition is impaired. He exhibits impaired recent memory.     Comments: Baseline cognitive deficits  Pleasant            Assessment & Plan:   Problem List Items Addressed This Visit      Cardiovascular and Mediastinum   Essential hypertension - Primary    bp in fair control at this time  BP Readings from Last 1 Encounters:  12/22/18 124/80   No changes needed Most recent labs reviewed  Disc lifstyle change with low  sodium diet and exercise        Relevant Medications   lisinopril (PRINIVIL,ZESTRIL) 5 MG tablet     Other   Short-term memory loss    Worsening with age (in conjunction with past organic brain injury and baseline cog deficits)  Declines neuro eval or medication Stressed imp of good rest/avoidance of sedating medication and supervision to prevent accidents  Socialization encouraged       Prostate cancer screening    Lab Results  Component Value Date   PSA 0.76 12/15/2018   PSA 0.62 12/12/2017   PSA 0.59 06/10/2017   Nocturia 1-2 times per  night  No clinical changes       Prediabetes    Lab Results  Component Value Date   HGBA1C 6.0 12/15/2018   disc imp of low glycemic diet and wt loss to prevent DM2       HYPERCHOLESTEROLEMIA    Disc goals for lipids and reasons to control them Rev last labs with pt Rev low sat fat diet in detail LDL of 108 in setting of HTN Baseline low HDL Hesitant to take cholesterol medication (also no medicare part D and cannot afford)         Relevant Medications   lisinopril (PRINIVIL,ZESTRIL) 5 MG tablet   History of recent fall    All falls this year assoc with going to the bathroom at night and forgetting walker  Wife unable to get him up now Disc fall prev in detail  Will consider bed rail or noise/alarm system that will alert her when he wakes so she can remind to use walker/help him get to bathroom safely  In the long term they may need assisted living-[facility] and would like to stay home as long as possible      Cognitive decline    This continues as expected Organic brain injury originally -now some more age related changes/short term memory  Mini cog test rev Declines tx with medication or neuro eval  Wife does care for him  Fall prev is most important       Chronic back pain    Ongoing -with deg disc and joint dz  Flexeril helps /warned of fall risk  Declines orthopedic f/u or PT at this time Enc use of a walker      Relevant Medications   cyclobenzaprine (FLEXERIL) 10 MG tablet

## 2018-12-24 NOTE — Assessment & Plan Note (Signed)
All falls this year assoc with going to the bathroom at night and forgetting walker  Wife unable to get him up now Disc fall prev in detail  Will consider bed rail or noise/alarm system that will alert her when he wakes so she can remind to use walker/help him get to bathroom safely  In the long term they may need assisted living-[facility] and would like to stay home as long as possible

## 2018-12-24 NOTE — Assessment & Plan Note (Signed)
Lab Results  Component Value Date   HGBA1C 6.0 12/15/2018   disc imp of low glycemic diet and wt loss to prevent DM2

## 2018-12-24 NOTE — Assessment & Plan Note (Signed)
Worsening with age (in conjunction with past organic brain injury and baseline cog deficits)  Declines neuro eval or medication Stressed imp of good rest/avoidance of sedating medication and supervision to prevent accidents  Socialization encouraged

## 2018-12-24 NOTE — Assessment & Plan Note (Signed)
bp in fair control at this time  BP Readings from Last 1 Encounters:  12/22/18 124/80   No changes needed Most recent labs reviewed  Disc lifstyle change with low sodium diet and exercise

## 2018-12-24 NOTE — Assessment & Plan Note (Signed)
Lab Results  Component Value Date   PSA 0.76 12/15/2018   PSA 0.62 12/12/2017   PSA 0.59 06/10/2017   Nocturia 1-2 times per night  No clinical changes

## 2018-12-24 NOTE — Assessment & Plan Note (Signed)
This continues as expected Organic brain injury originally -now some more age related changes/short term memory  Mini cog test rev Declines tx with medication or neuro eval  Wife does care for him  Fall prev is most important

## 2018-12-24 NOTE — Assessment & Plan Note (Signed)
Disc goals for lipids and reasons to control them Rev last labs with pt Rev low sat fat diet in detail LDL of 108 in setting of HTN Baseline low HDL Hesitant to take cholesterol medication (also no medicare part D and cannot afford)

## 2018-12-24 NOTE — Assessment & Plan Note (Signed)
Ongoing -with deg disc and joint dz  Flexeril helps /warned of fall risk  Declines orthopedic f/u or PT at this time Enc use of a walker

## 2019-02-19 ENCOUNTER — Other Ambulatory Visit: Payer: Self-pay | Admitting: Family Medicine

## 2019-05-07 ENCOUNTER — Other Ambulatory Visit: Payer: Self-pay | Admitting: Family Medicine

## 2019-07-30 ENCOUNTER — Other Ambulatory Visit: Payer: Self-pay | Admitting: Family Medicine

## 2019-10-03 ENCOUNTER — Other Ambulatory Visit: Payer: Self-pay | Admitting: Family Medicine

## 2019-12-14 ENCOUNTER — Other Ambulatory Visit: Payer: Self-pay | Admitting: Family Medicine

## 2019-12-17 ENCOUNTER — Other Ambulatory Visit: Payer: Medicare Other

## 2019-12-23 ENCOUNTER — Telehealth: Payer: Self-pay | Admitting: Family Medicine

## 2019-12-23 DIAGNOSIS — I10 Essential (primary) hypertension: Secondary | ICD-10-CM

## 2019-12-23 DIAGNOSIS — E7800 Pure hypercholesterolemia, unspecified: Secondary | ICD-10-CM

## 2019-12-23 DIAGNOSIS — R7303 Prediabetes: Secondary | ICD-10-CM

## 2019-12-23 DIAGNOSIS — Z125 Encounter for screening for malignant neoplasm of prostate: Secondary | ICD-10-CM

## 2019-12-23 NOTE — Telephone Encounter (Signed)
-----   Message from Ellamae Sia sent at 12/12/2019 10:38 AM EST ----- Regarding: lab orders for Monday, 12.28.20  AWV lab orders, please.

## 2019-12-24 ENCOUNTER — Other Ambulatory Visit: Payer: Self-pay

## 2019-12-24 ENCOUNTER — Other Ambulatory Visit (INDEPENDENT_AMBULATORY_CARE_PROVIDER_SITE_OTHER): Payer: Medicare Other

## 2019-12-24 ENCOUNTER — Ambulatory Visit (INDEPENDENT_AMBULATORY_CARE_PROVIDER_SITE_OTHER): Payer: Medicare Other

## 2019-12-24 DIAGNOSIS — Z0000 Encounter for general adult medical examination without abnormal findings: Secondary | ICD-10-CM | POA: Diagnosis not present

## 2019-12-24 DIAGNOSIS — R7303 Prediabetes: Secondary | ICD-10-CM

## 2019-12-24 DIAGNOSIS — Z125 Encounter for screening for malignant neoplasm of prostate: Secondary | ICD-10-CM

## 2019-12-24 DIAGNOSIS — I10 Essential (primary) hypertension: Secondary | ICD-10-CM | POA: Diagnosis not present

## 2019-12-24 DIAGNOSIS — E7800 Pure hypercholesterolemia, unspecified: Secondary | ICD-10-CM | POA: Diagnosis not present

## 2019-12-24 LAB — CBC WITH DIFFERENTIAL/PLATELET
Basophils Absolute: 0.1 10*3/uL (ref 0.0–0.1)
Basophils Relative: 1.9 % (ref 0.0–3.0)
Eosinophils Absolute: 0.3 10*3/uL (ref 0.0–0.7)
Eosinophils Relative: 3.6 % (ref 0.0–5.0)
HCT: 46.8 % (ref 39.0–52.0)
Hemoglobin: 15.4 g/dL (ref 13.0–17.0)
Lymphocytes Relative: 42.8 % (ref 12.0–46.0)
Lymphs Abs: 3.2 10*3/uL (ref 0.7–4.0)
MCHC: 32.9 g/dL (ref 30.0–36.0)
MCV: 96.3 fl (ref 78.0–100.0)
Monocytes Absolute: 0.9 10*3/uL (ref 0.1–1.0)
Monocytes Relative: 11.6 % (ref 3.0–12.0)
Neutro Abs: 3 10*3/uL (ref 1.4–7.7)
Neutrophils Relative %: 40.1 % — ABNORMAL LOW (ref 43.0–77.0)
Platelets: 233 10*3/uL (ref 150.0–400.0)
RBC: 4.86 Mil/uL (ref 4.22–5.81)
RDW: 13.9 % (ref 11.5–15.5)
WBC: 7.6 10*3/uL (ref 4.0–10.5)

## 2019-12-24 LAB — LIPID PANEL
Cholesterol: 194 mg/dL (ref 0–200)
HDL: 29.4 mg/dL — ABNORMAL LOW (ref >39.00)
LDL Cholesterol: 132 mg/dL — ABNORMAL HIGH (ref 0–99)
NonHDL: 164.55
Total CHOL/HDL Ratio: 7
Triglycerides: 162 mg/dL — ABNORMAL HIGH (ref 0.0–149.0)
VLDL: 32.4 mg/dL (ref 0.0–40.0)

## 2019-12-24 LAB — PSA, MEDICARE: PSA: 0.66 ng/ml (ref 0.10–4.00)

## 2019-12-24 LAB — COMPREHENSIVE METABOLIC PANEL
ALT: 10 U/L (ref 0–53)
AST: 16 U/L (ref 0–37)
Albumin: 4.3 g/dL (ref 3.5–5.2)
Alkaline Phosphatase: 86 U/L (ref 39–117)
BUN: 18 mg/dL (ref 6–23)
CO2: 29 mEq/L (ref 19–32)
Calcium: 9.9 mg/dL (ref 8.4–10.5)
Chloride: 107 mEq/L (ref 96–112)
Creatinine, Ser: 1.2 mg/dL (ref 0.40–1.50)
GFR: 58.57 mL/min — ABNORMAL LOW (ref >60.00)
Glucose, Bld: 105 mg/dL — ABNORMAL HIGH (ref 70–99)
Potassium: 3.9 mEq/L (ref 3.5–5.1)
Sodium: 144 mEq/L (ref 135–145)
Total Bilirubin: 0.6 mg/dL (ref 0.2–1.2)
Total Protein: 7.2 g/dL (ref 6.0–8.3)

## 2019-12-24 LAB — TSH: TSH: 4.07 u[IU]/mL (ref 0.35–4.50)

## 2019-12-24 LAB — HEMOGLOBIN A1C: Hgb A1c MFr Bld: 6 % (ref 4.6–6.5)

## 2019-12-24 NOTE — Progress Notes (Signed)
Subjective:   Robert Fitzgerald is a 78 y.o. male who presents for Medicare Annual/Subsequent preventive examination.  Review of Systems: N/A   This visit is being conducted through telemedicine via telephone at the nurse health advisor's home address due to the COVID-19 pandemic. This patient has given me verbal consent via doximity to conduct this visit, patient states they are participating from their home address. Patient and myself are on the telephone call. There is no referral for this visit. Some vital signs may be absent or patient reported.    Patient identification: identified by name, DOB, and current address   Cardiac Risk Factors include: advanced age (>1655men, 55>65 women);hypertension;male gender;Other (see comment), Risk factor comments: hypercholesterolemia     Objective:    Vitals: There were no vitals taken for this visit.  There is no height or weight on file to calculate BMI.  Advanced Directives 12/24/2019 12/22/2018 12/12/2017 09/16/2017 12/10/2016  Does Patient Have a Medical Advance Directive? Yes No No No No  Type of Estate agentAdvance Directive Healthcare Power of [place];Living will - - - -  Copy of Healthcare Power of Attorney in Chart? No - copy requested - - - -  Would patient like information on creating a medical advance directive? - No - Patient declined No - Patient declined No - Patient declined -    Tobacco Social History   Tobacco Use  Smoking Status Never Smoker  Smokeless Tobacco Never Used     Counseling given: Not Answered   Clinical Intake:  Pre-visit preparation completed: Yes  Pain : 0-10 Pain Score: 10-Worst pain ever Pain Type: Chronic pain Pain Location: Back Pain Orientation: Lower Pain Descriptors / Indicators: Aching Pain Onset: More than a month ago Pain Frequency: Intermittent     Nutritional Risks: None Diabetes: No  How often do you need to have someone help you when you read instructions, pamphlets, or other written  materials from your doctor or pharmacy?: 1 - Never What is the last grade level you completed in school?: 12th  Interpreter Needed?: No  Information entered by :: CJohnson, LPN  Past Medical History:  Diagnosis Date  . Chronic back pain   . 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 Diverticulitis 1992  . ED (erectile dysfunction)   . GERD (gastroesophageal reflux disease)   . Headache, post-traumatic, chronic   . Hyperglycemia    Type 2, mild  . Hyperlipidemia    Diet controlled  . Hypertension   . Tinnitus    chronic   Past Surgical History:  Procedure Laterality Date  . BACK SURGERY     x 6- including fusion  . CIRCUMCISION    . FEMUR IM NAIL Right 09/16/2017   Procedure: INTRAMEDULLARY (IM) NAIL FEMORAL;  Surgeon: Ollen GrossAluisio, Frank, MD;  Location: [facility];  Service: Orthopedics;  Laterality: Right;  . NASAL SEPTUM SURGERY  1999  . SHOULDER SURGERY  1999   2011 for bone spur   Family History  Problem Relation Age of Onset  . Aneurysm Brother        Brain   Social History   Socioeconomic History  . Marital status: Married    Spouse name: Olegario MessierKathy  . Number of children: 1  . Years of education: 1012  . Highest education level: Not on file  Occupational History  . Occupation: Retired  Tobacco Use  . Smoking status: Never Smoker  . Smokeless tobacco: Never Used  Substance and Sexual Activity  .  Alcohol use: No    Alcohol/week: 0.0 standard drinks  . Drug use: No  . Sexual activity: Yes  Other Topics Concern  . Not on file  Social History Narrative   Lives at home with wife.    Caffeine use: Coffee or tea daily   Diet-soda   Left handed   Social Determinants of Health   Financial Resource Strain: Low Risk   . Difficulty of Paying Living Expenses: Not hard at all  Food Insecurity: No Food Insecurity  . Worried About Charity fundraiser in the Last Year: Never true  . Ran Out of Food in the Last Year: Never true  Transportation Needs: No  Transportation Needs  . Lack of Transportation (Medical): No  . Lack of Transportation (Non-Medical): No  Physical Activity: Inactive  . Days of Exercise per Week: 0 days  . Minutes of Exercise per Session: 0 min  Stress: Stress Concern Present  . Feeling of Stress : Very much  Social Connections:   . Frequency of Communication with Friends and Family: Not on file  . Frequency of Social Gatherings with Friends and Family: Not on file  . Attends Religious Services: Not on file  . Active Member of Clubs or Organizations: Not on file  . Attends Archivist Meetings: Not on file  . Marital Status: Not on file    Outpatient Encounter Medications as of 12/24/2019  Medication Sig  . acetaminophen (TYLENOL) 325 MG tablet Take 2 tablets (650 mg total) by mouth every 6 (six) hours as needed for mild pain (or Fever >/= 101).  . cyclobenzaprine (FLEXERIL) 10 MG tablet TAKE 1 TABLET BY MOUTH TWICE DAILY AS NEEDED FOR  BACK  PAIN  . lisinopril (PRINIVIL,ZESTRIL) 5 MG tablet Take 1 tablet (5 mg total) by mouth daily.  . sildenafil (VIAGRA) 100 MG tablet TAKE 1 TABLET BY MOUTH EVERY DAY AS NEEDED FOR ERECTILE DYSFUNCTION   No facility-administered encounter medications on file as of 12/24/2019.    Activities of Daily Living In your present state of health, do you have any difficulty performing the following activities: 12/24/2019  Hearing? N  Vision? N  Difficulty concentrating or making decisions? Y  Comment forgetting a lot  Walking or climbing stairs? N  Dressing or bathing? N  Doing errands, shopping? N  Preparing Food and eating ? N  Using the Toilet? N  In the past six months, have you accidently leaked urine? N  Do you have problems with loss of bowel control? N  Managing your Medications? N  Managing your Finances? N  Housekeeping or managing your Housekeeping? N  Some recent data might be hidden    Patient Care Team: Tower, Wynelle Fanny, MD as PCP - Silvano Rusk, MD as Consulting Physician (Ophthalmology)   Assessment:   This is a routine wellness examination for [name].  Exercise Activities and Dietary recommendations Current Exercise Habits: Home exercise routine, Type of exercise: walking, Time (Minutes): 60, Frequency (Times/Week): 4, Weekly Exercise (Minutes/Week): 240, Intensity: Mild, Exercise limited by: None identified  Goals    . Follow up with Primary Care Provider     Starting 12/22/2018, I will continue to take medications as prescribed and follow up with PCP and other specialists as scheduled.     . Patient Stated     12/24/2019, I will maintain and continue medications as prescribed.       Fall Risk Fall Risk  12/24/2019 12/22/2018 12/12/2017 12/10/2016 11/04/2015  Falls in  the past year? 0 1 Yes No No  Comment - per spouse, patient has fallen 10-11 times since Dec 2018 due to loss of balance Sept 2018; fall with injury and subsequent surgery - -  Number falls in past yr: 0 1 1 - -  Injury with Fall? 0 1 Yes - -  Risk for fall due to : Medication side effect History of fall(s);Impaired balance/gait;Impaired mobility Impaired balance/gait;Impaired vision;Impaired mobility;Mental status change - -  Follow up Falls prevention discussed;Falls evaluation completed - Falls prevention discussed - -   Is the patient's home free of loose throw rugs in walkways, pet beds, electrical cords, etc?   yes      Grab bars in the bathroom? yes      Handrails on the stairs?   yes      Adequate lighting?   yes  Timed Get Up and Go Performed: N/A  Depression Screen PHQ 2/9 Scores 12/24/2019 12/22/2018 12/12/2017 12/10/2016  PHQ - 2 Score 0 0 0 0  PHQ- 9 Score 0 0 0 -    Cognitive Function MMSE - Mini Mental State Exam 12/24/2019 12/22/2018 12/12/2017 08/09/2017 12/10/2016  Orientation to time Orientation to time comments - needed cues - - pt was not able to provide correct year; stated it 2002  Orientation to Place Orientation to Place-comments - needed cues - - -  Registration Attention/ Calculation 0 0 0 2 0  Recall 3 1 0 1 0  Recall-comments - unable to recall 2 of 3 words unable to recall 3 of 3 words - pt was unable to recall 3 of 3 words  Language- name 2 objects - 0 0 2 0  Language- repeat Language- follow 3 step command - 0 1 3 0  Language- follow 3 step command-comments - unable to follow 3 steps of 3 step command unable to follow 2 steps of 3 step command - pt was unable to follow 3 step command despite multiple cues provided by nurse and spouse  Language- read & follow direction - 0 0 1 0  Write a sentence - 0 0 1 0  Copy design - 0 0 0 0  Total score - Mini Cog  Mini-Cog screen was completed. Maximum score is 22. A value of 0 denotes this part of the MMSE was not completed or the patient failed this part of the Mini-Cog screening.       Immunization History  Administered Date(s) Administered  . H1N1 01/17/2009  . Influenza Split 10/19/2011, 09/18/2014  . Influenza Whole 10/08/2008, 09/10/2009, 09/26/2010  . Influenza, High Dose Seasonal PF 12/22/2018  . Influenza,inj,Quad PF,6+ Mos 11/04/2015  . Influenza-Unspecified 08/27/2016, 08/27/2017  . Pneumococcal Conjugate-13 01/10/2015  . Pneumococcal Polysaccharide-23 12/27/1996, 10/08/2008  . Td 02/13/2013    Qualifies for Shingles Vaccine? Yes  Screening Tests Health Maintenance  Topic Date Due  . OPHTHALMOLOGY EXAM  07/27/2017  . HEMOGLOBIN A1C  06/16/2019  . INFLUENZA VACCINE  07/28/2019  . FOOT EXAM  12/23/2019  . TETANUS/TDAP  02/13/2023  . PNA vac Low Risk Adult  Completed   Cancer Screenings: Lung: Low Dose CT Chest recommended if Age 32-80 years, 30 pack-year currently smoking OR have quit w/in 15years. Patient does not qualify. Colorectal: completed 09/21/2010  Additional Screenings:  Hepatitis C Screening: N/A  Plan:   Patient will maintain and continue  medications as prescribed.  I have personally reviewed and noted the following in the patient's chart:   . Medical and social history . Use of alcohol, tobacco or illicit drugs  . Current medications and supplements . Functional ability and status . Nutritional status . Physical activity . Advanced directives . List of other physicians . Hospitalizations, surgeries, and ER visits in previous 12 months . Vitals . Screenings to include cognitive, depression, and falls . Referrals and appointments  In addition, I have reviewed and discussed with patient certain preventive protocols, quality metrics, and best practice recommendations. A written personalized care plan for preventive services as well as general preventive health recommendations were provided to patient.     Janalyn Shy, LPN  [id number]

## 2019-12-24 NOTE — Progress Notes (Signed)
PCP notes:  Health Maintenance: Eye exam- Patient will scheudle Needs flu vaccine  Abnormal Screenings: MMSE score- 17   Patient concerns: none   Nurse concerns: none   Next PCP appt.: 12/25/2019 @ 3:45 pm

## 2019-12-24 NOTE — Patient Instructions (Signed)
[name] , Thank you for taking time to come for your Medicare Wellness Visit. I appreciate your ongoing commitment to your health goals. Please review the following plan we discussed and let me know if I can assist you in the future.   Screening recommendations/referrals: Colonoscopy: Up to date, completed 09/21/2010 Recommended yearly ophthalmology/optometry visit for glaucoma screening and checkup Recommended yearly dental visit for hygiene and checkup  Vaccinations: Influenza vaccine: will get at physical  Pneumococcal vaccine: Completed series Tdap vaccine: Up to date, completed 02/13/2013 Shingles vaccine: discussed    Advanced directives: Please bring a copy of your POA (Power of [place]) and/or Living Will to your next appointment.  Conditions/risks identified: hypertension, hypercholesterolemia  Next appointment: 12/25/2019 @ 3:45 pm   Preventive Care 65 Years and Older, Male Preventive care refers to lifestyle choices and visits with your health care provider that can promote health and wellness. What does preventive care include?  A yearly physical exam. This is also called an annual well check.  Dental exams once or twice a year.  Routine eye exams. Ask your health care provider how often you should have your eyes checked.  Personal lifestyle choices, including:  Daily care of your teeth and gums.  Regular physical activity.  Eating a healthy diet.  Avoiding tobacco and drug use.  Limiting alcohol use.  Practicing safe sex.  Taking low doses of aspirin every day.  Taking vitamin and mineral supplements as recommended by your health care provider. What happens during an annual well check? The services and screenings done by your health care provider during your annual well check will depend on your age, overall health, lifestyle risk factors, and family history of disease. Counseling  Your health care provider may ask you questions about your:  Alcohol  use.  Tobacco use.  Drug use.  Emotional well-being.  Home and relationship well-being.  Sexual activity.  Eating habits.  History of falls.  Memory and ability to understand (cognition).  Work and work Statistician. Screening  You may have the following tests or measurements:  Height, weight, and BMI.  Blood pressure.  Lipid and cholesterol levels. These may be checked every 5 years, or more frequently if you are over 16 years old.  Skin check.  Lung cancer screening. You may have this screening every year starting at age 57 if you have a 30-pack-year history of smoking and currently smoke or have quit within the past 15 years.  Fecal occult blood test (FOBT) of the stool. You may have this test every year starting at age 47.  Flexible sigmoidoscopy or colonoscopy. You may have a sigmoidoscopy every 5 years or a colonoscopy every 10 years starting at age 31.  Prostate cancer screening. Recommendations will vary depending on your family history and other risks.  Hepatitis C blood test.  Hepatitis B blood test.  Sexually transmitted disease (STD) testing.  Diabetes screening. This is done by checking your blood sugar (glucose) after you have not eaten for a while (fasting). You may have this done every 1-3 years.  Abdominal aortic aneurysm (AAA) screening. You may need this if you are a current or former smoker.  Osteoporosis. You may be screened starting at age 34 if you are at high risk. Talk with your health care provider about your test results, treatment options, and if necessary, the need for more tests. Vaccines  Your health care provider may recommend certain vaccines, such as:  Influenza vaccine. This is recommended every year.  Tetanus,  diphtheria, and acellular pertussis (Tdap, Td) vaccine. You may need a Td booster every 10 years.  Zoster vaccine. You may need this after age 32.  Pneumococcal 13-valent conjugate (PCV13) vaccine. One dose is  recommended after age 83.  Pneumococcal polysaccharide (PPSV23) vaccine. One dose is recommended after age 62. Talk to your health care provider about which screenings and vaccines you need and how often you need them. This information is not intended to replace advice given to you by your health care provider. Make sure you discuss any questions you have with your health care provider. Document Released: 01/09/2016 Document Revised: 09/01/2016 Document Reviewed: 10/14/2015 Elsevier Interactive Patient Education  2017 [place] Prevention in the Home Falls can cause injuries. They can happen to people of all ages. There are many things you can do to make your home safe and to help prevent falls. What can I do on the outside of my home?  Regularly fix the edges of walkways and driveways and fix any cracks.  Remove anything that might make you trip as you walk through a door, such as a raised step or threshold.  Trim any bushes or trees on the path to your home.  Use bright outdoor lighting.  Clear any walking paths of anything that might make someone trip, such as rocks or tools.  Regularly check to see if handrails are loose or broken. Make sure that both sides of any steps have handrails.  Any raised decks and porches should have guardrails on the edges.  Have any leaves, snow, or ice cleared regularly.  Use sand or salt on walking paths during winter.  Clean up any spills in your garage right away. This includes oil or grease spills. What can I do in the bathroom?  Use night lights.  Install grab bars by the toilet and in the tub and shower. Do not use towel bars as grab bars.  Use non-skid mats or decals in the tub or shower.  If you need to sit down in the shower, use a plastic, non-slip stool.  Keep the floor dry. Clean up any water that spills on the floor as soon as it happens.  Remove soap buildup in the tub or shower regularly.  Attach bath mats  securely with double-sided non-slip rug tape.  Do not have throw rugs and other things on the floor that can make you trip. What can I do in the bedroom?  Use night lights.  Make sure that you have a light by your bed that is easy to reach.  Do not use any sheets or blankets that are too big for your bed. They should not hang down onto the floor.  Have a firm chair that has side arms. You can use this for support while you get dressed.  Do not have throw rugs and other things on the floor that can make you trip. What can I do in the kitchen?  Clean up any spills right away.  Avoid walking on wet floors.  Keep items that you use a lot in easy-to-reach places.  If you need to reach something above you, use a strong step stool that has a grab bar.  Keep electrical cords out of the way.  Do not use floor polish or wax that makes floors slippery. If you must use wax, use non-skid floor wax.  Do not have throw rugs and other things on the floor that can make you trip. What can I do with  my stairs?  Do not leave any items on the stairs.  Make sure that there are handrails on both sides of the stairs and use them. Fix handrails that are broken or loose. Make sure that handrails are as long as the stairways.  Check any carpeting to make sure that it is firmly attached to the stairs. Fix any carpet that is loose or worn.  Avoid having throw rugs at the top or bottom of the stairs. If you do have throw rugs, attach them to the floor with carpet tape.  Make sure that you have a light switch at the top of the stairs and the bottom of the stairs. If you do not have them, ask someone to add them for you. What else can I do to help prevent falls?  Wear shoes that:  Do not have high heels.  Have rubber bottoms.  Are comfortable and fit you well.  Are closed at the toe. Do not wear sandals.  If you use a stepladder:  Make sure that it is fully opened. Do not climb a closed  stepladder.  Make sure that both sides of the stepladder are locked into place.  Ask someone to hold it for you, if possible.  Clearly mark and make sure that you can see:  Any grab bars or handrails.  First and last steps.  Where the edge of each step is.  Use tools that help you move around (mobility aids) if they are needed. These include:  Canes.  Walkers.  Scooters.  Crutches.  Turn on the lights when you go into a dark area. Replace any light bulbs as soon as they burn out.  Set up your furniture so you have a clear path. Avoid moving your furniture around.  If any of your floors are uneven, fix them.  If there are any pets around you, be aware of where they are.  Review your medicines with your doctor. Some medicines can make you feel dizzy. This can increase your chance of falling. Ask your doctor what other things that you can do to help prevent falls. This information is not intended to replace advice given to you by your health care provider. Make sure you discuss any questions you have with your health care provider. Document Released: 10/09/2009 Document Revised: 05/20/2016 Document Reviewed: 01/17/2015 Elsevier Interactive Patient Education  2017 [employer].

## 2019-12-25 ENCOUNTER — Ambulatory Visit: Payer: Medicare Other

## 2019-12-25 ENCOUNTER — Ambulatory Visit (INDEPENDENT_AMBULATORY_CARE_PROVIDER_SITE_OTHER): Payer: Medicare Other | Admitting: Family Medicine

## 2019-12-25 ENCOUNTER — Other Ambulatory Visit: Payer: Self-pay

## 2019-12-25 ENCOUNTER — Encounter: Payer: Self-pay | Admitting: Family Medicine

## 2019-12-25 VITALS — BP 130/80 | HR 75 | Temp 97.7°F | Ht 72.25 in | Wt 164.3 lb

## 2019-12-25 DIAGNOSIS — E7800 Pure hypercholesterolemia, unspecified: Secondary | ICD-10-CM | POA: Diagnosis not present

## 2019-12-25 DIAGNOSIS — R413 Other amnesia: Secondary | ICD-10-CM

## 2019-12-25 DIAGNOSIS — R4189 Other symptoms and signs involving cognitive functions and awareness: Secondary | ICD-10-CM

## 2019-12-25 DIAGNOSIS — I10 Essential (primary) hypertension: Secondary | ICD-10-CM

## 2019-12-25 DIAGNOSIS — Z125 Encounter for screening for malignant neoplasm of prostate: Secondary | ICD-10-CM

## 2019-12-25 DIAGNOSIS — R7303 Prediabetes: Secondary | ICD-10-CM

## 2019-12-25 DIAGNOSIS — Z23 Encounter for immunization: Secondary | ICD-10-CM | POA: Diagnosis not present

## 2019-12-25 MED ORDER — LISINOPRIL 5 MG PO TABS: 5.0000 mg | ORAL_TABLET | Freq: Every day | ORAL | 3 refills | Status: AC

## 2019-12-25 NOTE — Assessment & Plan Note (Signed)
Disc goals for lipids and reasons to control them Rev last labs with pt Rev low sat fat diet in detail LDL is up to 132  Pt has eaten more beef/liver and this could be the reason Pt would like to avoid cholesterol medication  Will work on diet

## 2019-12-25 NOTE — Assessment & Plan Note (Signed)
bp in fair control at this time  BP Readings from Last 1 Encounters:  12/25/19 130/80   No changes needed Most recent labs reviewed  Disc lifstyle change with low sodium diet and exercise

## 2019-12-25 NOTE — Assessment & Plan Note (Signed)
Lab Results  Component Value Date   HGBA1C 6.0 12/24/2019   This is stable as is weight  disc imp of low glycemic diet and wt loss to prevent DM2

## 2019-12-25 NOTE — Progress Notes (Signed)
Subjective:    Patient ID: Robert Fitzgerald, male    DOB: [birth year], 78 y.o.   MRN: [id number]  This visit occurred during the SARS-CoV-2 public health emergency.  Safety protocols were in place, including screening questions prior to the visit, additional usage of staff PPE, and extensive cleaning of exam room while observing appropriate contact time as indicated for disinfecting solutions.     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  No big changes  No accidents  No falls this year  Declines medication for dementia at this time   Zoster status -not interested in shingrix yet   Colonoscopy 9/11 -normal  cologuard test was nl 1/18   Prostate health  Lab Results  Component Value Date   PSA 0.66 12/24/2019   PSA 0.76 12/15/2018   PSA 0.62 12/12/2017  no urinary symptoms at all  Very seldom has nocturia   stable   bp is stable today  No cp or palpitations or headaches or edema  No side effects to medicines  BP Readings from Last 3 Encounters:  12/25/19 (!) 142/84  12/22/18 124/80  12/22/18 124/80     Lab Results  Component Value Date   CREATININE 1.20 12/24/2019   BUN 18 12/24/2019   NA 144 12/24/2019   K 3.9 12/24/2019   CL 107 12/24/2019   CO2 29 12/24/2019    Hyperlipidemia Lab Results  Component Value Date   CHOL 194 12/24/2019   CHOL 164 12/15/2018   CHOL 160 06/07/2018   Lab Results  Component Value Date   HDL 29.40 (L) 12/24/2019   HDL 30.00 (L) 12/15/2018   HDL 30.50 (L) 06/07/2018   Lab Results    Component Value Date   LDLCALC 132 (H) 12/24/2019   LDLCALC 108 (H) 12/15/2018   LDLCALC 107 (H) 06/07/2018   Lab Results  Component Value Date   TRIG 162.0 (H) 12/24/2019   TRIG 127.0 12/15/2018   TRIG 111.0 06/07/2018   Lab Results  Component Value Date   CHOLHDL 7 12/24/2019   CHOLHDL 5 12/15/2018   CHOLHDL 5 06/07/2018   Lab Results  Component Value Date   LDLDIRECT 112.0 06/14/2017   LDLDIRECT 127.0 12/10/2016   LDLDIRECT 94.0 04/11/2015   Diet controlled  He eats liver pudding-loves it  Also eats red meat  Not a lot of fried foods  Eats eggs daily  occ grilled cheese-not often  Ice cream- very infrequent    Prediabetes Lab Results  Component Value Date   HGBA1C 6.0 12/24/2019  this is unchanged from a year ago  Controlled with diet  Does not eat a whole lot of sugar   Lab Results  Component Value Date   TSH 4.07 12/24/2019    Lab Results  Component Value Date   ALT 10 12/24/2019   AST 16 12/24/2019   ALKPHOS 86 12/24/2019   BILITOT 0.6 12/24/2019  Patient Active Problem List   Diagnosis Date Noted  . Cognitive decline 06/12/2018  . H/O fracture of hip 09/16/2017  . Short-term memory loss 06/14/2017  . History of recent fall 12/14/2016  . Need for hepatitis C screening test 12/10/2016  . Prostate cancer screening 12/09/2016  . Medicare annual wellness visit, initial 11/04/2015  . Prediabetes 08/11/2011  . Essential hypertension 02/01/2008  . Chronic back pain 02/01/2008  . HYPERCHOLESTEROLEMIA 01/31/2008  . ERECTILE DYSFUNCTION 01/31/2008  . TINNITUS, CHRONIC 01/31/2008  . HIATAL HERNIA WITH REFLUX 01/31/2008  . HEAD TRAUMA, CLOSED 01/31/2008   Past Medical History:  Diagnosis Date  . Chronic back pain   . 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 Diverticulitis 1992  . ED (erectile dysfunction)   . GERD (gastroesophageal reflux disease)   . Headache, post-traumatic, chronic   . Hyperglycemia    Type 2, mild  .  Hyperlipidemia    Diet controlled  . Hypertension   . Tinnitus    chronic   Past Surgical History:  Procedure Laterality Date  . BACK SURGERY     x 6- including fusion  . CIRCUMCISION    . FEMUR IM NAIL Right 09/16/2017   Procedure: INTRAMEDULLARY (IM) NAIL FEMORAL;  Surgeon: Ollen Gross, MD;  Location: [facility];  Service: Orthopedics;  Laterality: Right;  . NASAL SEPTUM SURGERY  1999  . SHOULDER SURGERY  1999   2011 for bone spur   Social History   Tobacco Use  . Smoking status: Never Smoker  . Smokeless tobacco: Never Used  Substance Use Topics  . Alcohol use: No    Alcohol/week: 0.0 standard drinks  . Drug use: No   Family History  Problem Relation Age of Onset  . Aneurysm Brother        Brain   Allergies  Allergen Reactions  . Aspirin     REACTION: GI upset  . Codeine Sulfate     REACTION: nausea  . Naproxen Sodium     REACTION: GI upset   Current Outpatient Medications on File Prior to Visit  Medication Sig Dispense Refill  . acetaminophen (TYLENOL) 325 MG tablet Take 2 tablets (650 mg total) by mouth every 6 (six) hours as needed for mild pain (or Fever >/= 101). 40 tablet 0  . cyclobenzaprine (FLEXERIL) 10 MG tablet TAKE 1 TABLET BY MOUTH TWICE DAILY AS NEEDED FOR  BACK  PAIN 180 tablet 1  . sildenafil (VIAGRA) 100 MG tablet TAKE 1 TABLET BY MOUTH EVERY DAY AS NEEDED FOR ERECTILE DYSFUNCTION 10 tablet 0   No current facility-administered medications on file prior to visit.     Review of Systems  Constitutional: Negative for activity change, appetite change, fatigue, fever and unexpected weight change.  HENT: Negative for congestion, rhinorrhea, sore throat and trouble swallowing.   Eyes: Negative for pain, redness, itching and visual disturbance.  Respiratory: Negative for cough, chest tightness, shortness of breath and wheezing.   Cardiovascular: Negative for chest pain and palpitations.  Gastrointestinal: Negative for abdominal pain, blood in stool,  constipation, diarrhea and nausea.  Endocrine: Negative for cold intolerance, heat intolerance, polydipsia and polyuria.  Genitourinary: Negative for difficulty urinating, dysuria, frequency and urgency.  Musculoskeletal: Positive for arthralgias, back pain and gait problem. Negative for joint swelling and myalgias.       Chronic severe back pain  Uses walker   Skin: Negative for pallor and rash.  Neurological: Negative for dizziness, tremors, weakness, numbness and  headaches.       Poor balance  Hematological: Negative for adenopathy. Does not bruise/bleed easily.  Psychiatric/Behavioral: Negative for decreased concentration and dysphoric mood. The patient is not nervous/anxious.        Objective:   Physical Exam Constitutional:      General: He is not in acute distress.    Appearance: Normal appearance. He is well-developed and normal weight. He is not ill-appearing or diaphoretic.  HENT:     Head: Normocephalic and atraumatic.     Right Ear: Tympanic membrane, ear canal and external ear normal.     Left Ear: Tympanic membrane, ear canal and external ear normal.     Nose: Nose normal. No congestion.     Mouth/Throat:     Mouth: Mucous membranes are moist.     Pharynx: Oropharynx is clear. No posterior oropharyngeal erythema.  Eyes:     General: No scleral icterus.       Right eye: No discharge.        Left eye: No discharge.     Conjunctiva/sclera: Conjunctivae normal.     Pupils: Pupils are equal, round, and reactive to light.  Neck:     Thyroid: No thyromegaly.     Vascular: No carotid bruit or JVD.  Cardiovascular:     Rate and Rhythm: Normal rate and regular rhythm.     Pulses: Normal pulses.     Heart sounds: Normal heart sounds. No gallop.   Pulmonary:     Effort: Pulmonary effort is normal. No respiratory distress.     Breath sounds: Normal breath sounds. No wheezing or rales.     Comments: Good air exch Chest:     Chest wall: No tenderness.  Abdominal:      General: Bowel sounds are normal. There is no distension or abdominal bruit.     Palpations: Abdomen is soft. There is no mass.     Tenderness: There is no abdominal tenderness.     Hernia: No hernia is present.  Musculoskeletal:        General: No tenderness.     Cervical back: Normal range of motion and neck supple. No rigidity. No muscular tenderness.     Right lower leg: No edema.     Left lower leg: No edema.  Lymphadenopathy:     Cervical: No cervical adenopathy.  Skin:    General: Skin is warm and dry.     Coloration: Skin is not pale.     Findings: No erythema or rash.     Comments: Solar lentigines diffusely with solar aging Also scattered sks   Neurological:     Mental Status: He is alert.     Cranial Nerves: No cranial nerve deficit.     Sensory: No sensory deficit.     Motor: No abnormal muscle tone.     Coordination: Coordination abnormal.     Gait: Gait normal.     Deep Tendon Reflexes: Reflexes are normal and symmetric. Reflexes normal.     Comments: Poor balance- but able to walk steadily with walker  Also moderate back pain    Psychiatric:        Mood and Affect: Mood normal. Mood is not anxious or depressed.        Cognition and Memory: Cognition normal. He exhibits impaired recent memory.     Comments: Cognition is slow but no significant change noted  Pt does repeat himself  Less talkative this visit  Pleasant  Assessment & Plan:   Problem List Items Addressed This Visit      Cardiovascular and Mediastinum   Essential hypertension - Primary    bp in fair control at this time  BP Readings from Last 1 Encounters:  12/25/19 130/80   No changes needed Most recent labs reviewed  Disc lifstyle change with low sodium diet and exercise        Relevant Medications   lisinopril (ZESTRIL) 5 MG tablet     Other   HYPERCHOLESTEROLEMIA    Disc goals for lipids and reasons to control them Rev last labs with pt Rev low sat fat diet in  detail LDL is up to 132  Pt has eaten more beef/liver and this could be the reason Pt would like to avoid cholesterol medication  Will work on diet        Relevant Medications   lisinopril (ZESTRIL) 5 MG tablet   Prediabetes    Lab Results  Component Value Date   HGBA1C 6.0 12/24/2019   This is stable as is weight  disc imp of low glycemic diet and wt loss to prevent DM2       Prostate cancer screening    Lab Results  Component Value Date   PSA 0.66 12/24/2019   PSA 0.76 12/15/2018   PSA 0.62 12/12/2017    Stable  No urinary symptoms       Short-term memory loss   Cognitive decline    This seems stable per pt and wife  Already with organic brain disorder - baseline concentration and memory issues MMSE score of 17- reviewed No accidents or safety concerns at this time Pt declines any medication for dementia        Other Visit Diagnoses    Need for influenza vaccination       Relevant Orders   Flu Vaccine QUAD High Dose(Fluad) (Completed)

## 2019-12-25 NOTE — Assessment & Plan Note (Signed)
This seems stable per pt and wife  Already with organic brain disorder - baseline concentration and memory issues MMSE score of 17- reviewed No accidents or safety concerns at this time Pt declines any medication for dementia

## 2019-12-25 NOTE — Patient Instructions (Addendum)
You are due for an eye exam  Get that scheduled when you feel comfortable doing it  If you are interested in the new shingles vaccine (Shingrix) - call your local pharmacy to check on coverage and availability  If affordable, get on a wait list at your pharmacy to get the vaccine.  For cholesterol  Avoid red meat/ fried foods/ egg yolks/ fatty breakfast meats/ butter, liver/ cheese and high fat dairy/ and shellfish

## 2019-12-25 NOTE — Assessment & Plan Note (Signed)
Lab Results  Component Value Date   PSA 0.66 12/24/2019   PSA 0.76 12/15/2018   PSA 0.62 12/12/2017    Stable  No urinary symptoms

## 2019-12-26 ENCOUNTER — Other Ambulatory Visit: Payer: Self-pay | Admitting: Family Medicine

## 2020-03-18 ENCOUNTER — Other Ambulatory Visit: Payer: Self-pay

## 2020-03-18 MED ORDER — SILDENAFIL CITRATE 100 MG PO TABS: ORAL_TABLET | ORAL | 5 refills | Status: DC

## 2020-03-18 NOTE — Telephone Encounter (Signed)
[facility] signed) left v/m requesting cb ASAP to explain why viagra refill sent by [facility] [address] was denied. I spoke with Amber at [facility] [address] and advised did not see a refill request and she said if no answer back will consider denied; pt last got viagra 100 mg # 10 x 5 refills on 12/26/19; Amber said that pt has gotten # 60 of viagra since 12/26/19 and no refills are available. Pt last annual exam was 12/25/19. I explained to Robert Fitzgerald that I did not see refill request from [facility] but would send request to Dr Milinda Antis. Robert Fitzgerald said pt only takes viagra occasionally.

## 2020-05-27 DIAGNOSIS — Z23 Encounter for immunization: Secondary | ICD-10-CM | POA: Diagnosis not present

## 2020-06-25 ENCOUNTER — Other Ambulatory Visit: Payer: Self-pay | Admitting: Family Medicine

## 2020-06-26 NOTE — Telephone Encounter (Signed)
Pt wife calling wanting to know why the patient's Sildenafil was denied.  Last refilled 03/18/20 #10 x 5 refills.   Refill denied on 06/25/20 "too soon to refill"  Wife states that he is completely out of his medication and needs this refilled asap.  She wants to know if we are saying its "too soon to refill" how long is he supposed to be going in between doses? Is he supposed to take it everyday? How long is the #50 tablets supposed to last him?  Aware that Dr Milinda Antis is not in office this afternoon but she will be back tomorrow morning.   Please advise, thanks.

## 2020-06-26 NOTE — Telephone Encounter (Signed)
I do not remember denying this-may have been pharmacy (? How many his insurance will pay for per month)  I am fine with refilling the # his ins will pay for monthly with 6 mo of refills  Thanks

## 2020-06-27 MED ORDER — SILDENAFIL CITRATE 100 MG PO TABS: ORAL_TABLET | ORAL | 5 refills | Status: AC

## 2020-06-27 NOTE — Telephone Encounter (Addendum)
Robert Fitzgerald called and said that they do not use insurance for sildenafil; pt pays out of pocket. Robert Fitzgerald request # 20 for a months refill and will attach 5 months of refill as instructed. [facility] [address] Refills done as instructed.Robert Fitzgerald voiced understanding and appreciative.

## 2020-06-27 NOTE — Addendum Note (Signed)
Addended by: Patience Musca on: 06/27/2020 04:28 PM   Modules accepted: Orders

## 2020-09-19 DIAGNOSIS — I499 Cardiac arrhythmia, unspecified: Secondary | ICD-10-CM | POA: Diagnosis not present

## 2020-09-19 DIAGNOSIS — I469 Cardiac arrest, cause unspecified: Secondary | ICD-10-CM | POA: Diagnosis not present

## 2020-09-19 DIAGNOSIS — R404 Transient alteration of awareness: Secondary | ICD-10-CM | POA: Diagnosis not present

## 2020-09-19 DIAGNOSIS — R58 Hemorrhage, not elsewhere classified: Secondary | ICD-10-CM | POA: Diagnosis not present

## 2020-09-26 DIAGNOSIS — 419620001 Death: Secondary | SNOMED CT | POA: Diagnosis not present

## 2020-09-26 DEATH — deceased

## 2020-12-22 ENCOUNTER — Other Ambulatory Visit: Payer: Medicare Other

## 2020-12-23 ENCOUNTER — Ambulatory Visit: Payer: Medicare Other

## 2020-12-29 ENCOUNTER — Encounter: Payer: Medicare Other | Admitting: Family Medicine
